# Patient Record
Sex: Female | Born: 1985 | Race: White | Hispanic: No | Marital: Married | Smoking: Current every day smoker
Health system: Southern US, Community
[De-identification: ages and names within clinical notes are randomized; demographics above are authoritative.]

## PROBLEM LIST (undated history)

## (undated) ENCOUNTER — Inpatient Hospital Stay (HOSPITAL_COMMUNITY): Payer: Self-pay

## (undated) DIAGNOSIS — Z87442 Personal history of urinary calculi: Secondary | ICD-10-CM

## (undated) DIAGNOSIS — N200 Calculus of kidney: Secondary | ICD-10-CM

## (undated) DIAGNOSIS — K219 Gastro-esophageal reflux disease without esophagitis: Secondary | ICD-10-CM

## (undated) DIAGNOSIS — I10 Essential (primary) hypertension: Secondary | ICD-10-CM

## (undated) DIAGNOSIS — Z87440 Personal history of urinary (tract) infections: Secondary | ICD-10-CM

## (undated) HISTORY — PX: NO PAST SURGERIES: SHX2092

## (undated) HISTORY — DX: Essential (primary) hypertension: I10

---

## 2005-03-11 ENCOUNTER — Emergency Department (HOSPITAL_COMMUNITY): Admission: EM | Admit: 2005-03-11 | Discharge: 2005-03-12 | Payer: Self-pay | Admitting: Emergency Medicine

## 2008-03-04 ENCOUNTER — Emergency Department (HOSPITAL_COMMUNITY): Admission: EM | Admit: 2008-03-04 | Discharge: 2008-03-04 | Payer: Self-pay | Admitting: Emergency Medicine

## 2008-11-01 ENCOUNTER — Inpatient Hospital Stay (HOSPITAL_COMMUNITY): Admission: AD | Admit: 2008-11-01 | Discharge: 2008-11-02 | Payer: Self-pay | Admitting: Obstetrics & Gynecology

## 2010-08-01 LAB — URINALYSIS, ROUTINE W REFLEX MICROSCOPIC
Bilirubin Urine: NEGATIVE
Glucose, UA: NEGATIVE mg/dL
Hgb urine dipstick: NEGATIVE
Ketones, ur: 40 mg/dL — AB
Nitrite: NEGATIVE
Protein, ur: NEGATIVE mg/dL
Specific Gravity, Urine: 1.01 (ref 1.005–1.030)
Urobilinogen, UA: 1 mg/dL (ref 0.0–1.0)
pH: 6.5 (ref 5.0–8.0)

## 2010-08-01 LAB — URINE MICROSCOPIC-ADD ON

## 2010-08-01 LAB — CBC
HCT: 32.8 % — ABNORMAL LOW (ref 36.0–46.0)
Hemoglobin: 11.6 g/dL — ABNORMAL LOW (ref 12.0–15.0)
MCHC: 35.3 g/dL (ref 30.0–36.0)
MCV: 94.4 fL (ref 78.0–100.0)
Platelets: 205 10*3/uL (ref 150–400)
RBC: 3.48 MIL/uL — ABNORMAL LOW (ref 3.87–5.11)
RDW: 13.3 % (ref 11.5–15.5)
WBC: 12.9 10*3/uL — ABNORMAL HIGH (ref 4.0–10.5)

## 2010-08-01 LAB — WET PREP, GENITAL
Clue Cells Wet Prep HPF POC: NONE SEEN
Trich, Wet Prep: NONE SEEN
Yeast Wet Prep HPF POC: NONE SEEN

## 2010-08-01 LAB — GC/CHLAMYDIA PROBE AMP, GENITAL
Chlamydia, DNA Probe: NEGATIVE
GC Probe Amp, Genital: NEGATIVE

## 2012-09-18 ENCOUNTER — Encounter (HOSPITAL_COMMUNITY): Payer: Self-pay

## 2012-09-18 ENCOUNTER — Emergency Department (HOSPITAL_COMMUNITY)
Admission: EM | Admit: 2012-09-18 | Discharge: 2012-09-18 | Disposition: A | Discharge status: Home / Self Care | Payer: Managed Care, Other (non HMO) | Attending: Emergency Medicine | Admitting: Emergency Medicine

## 2012-09-18 DIAGNOSIS — S90569A Insect bite (nonvenomous), unspecified ankle, initial encounter: Secondary | ICD-10-CM | POA: Insufficient documentation

## 2012-09-18 DIAGNOSIS — F17200 Nicotine dependence, unspecified, uncomplicated: Secondary | ICD-10-CM | POA: Insufficient documentation

## 2012-09-18 DIAGNOSIS — Y939 Activity, unspecified: Secondary | ICD-10-CM | POA: Insufficient documentation

## 2012-09-18 DIAGNOSIS — W57XXXA Bitten or stung by nonvenomous insect and other nonvenomous arthropods, initial encounter: Secondary | ICD-10-CM

## 2012-09-18 DIAGNOSIS — Y929 Unspecified place or not applicable: Secondary | ICD-10-CM | POA: Insufficient documentation

## 2012-09-18 MED ORDER — DIPHENHYDRAMINE HCL 25 MG PO CAPS
50.0000 mg | ORAL_CAPSULE | Freq: Once | ORAL | Status: AC
  Administered 2012-09-18: 50 mg via ORAL
  Filled 2012-09-18: qty 2

## 2012-09-18 MED ORDER — SULFAMETHOXAZOLE-TRIMETHOPRIM 800-160 MG PO TABS: 1.0000 | ORAL_TABLET | Freq: Two times a day (BID) | ORAL | Status: DC

## 2012-09-18 NOTE — ED Provider Notes (Signed)
History     CSN: [id number]  Arrival date & time 09/18/12  0028   First MD Initiated Contact with Patient 09/18/12 0111      Chief Complaint  Patient presents with  . Insect Bite    (Consider location/radiation/quality/duration/timing/severity/associated sxs/prior treatment) HPI... bite right lower extremity approximately 24 hours ago.  Now area is inflamed.  No fever or chills. Palpation makes pain worse. Severity is mild. No radiation of pain. No other complaint  History reviewed. No pertinent past medical history.  History reviewed. No pertinent past surgical history.  No family history on file.  History  Substance Use Topics  . Smoking status: Current Every Day Smoker  . Smokeless tobacco: Not on file  . Alcohol Use: Yes    OB History   Grav Para Term Preterm Abortions TAB SAB Ect Mult Living                  Review of Systems  All other systems reviewed and are negative.    Allergies  Codeine  Home Medications   Current Outpatient Rx  Name  Route  Sig  Dispense  Refill  . sulfamethoxazole-trimethoprim (SEPTRA DS) 800-160 MG per tablet   Oral   Take 1 tablet by mouth every 12 (twelve) hours.   14 tablet   0     BP 153/88  Pulse 88  Temp(Src) 98.1 F (36.7 C) (Oral)  Resp 16  Ht 5\' 2"  (1.575 m)  Wt 145 lb (65.772 kg)  BMI 26.51 kg/m2  SpO2 100%  LMP 09/04/2012  Physical Exam  Constitutional: She is oriented to person, place, and time. She appears well-developed and well-nourished.  HENT:  Head: Normocephalic.  Musculoskeletal: Normal range of motion.  Neurological: She is alert and oriented to person, place, and time.  Skin:  Right lower extremity: Erythema approximately 4 cm in diameter on the proximal lateral aspect of the right tibia  Psychiatric: She has a normal mood and affect.    ED Course  Procedures (including critical care time)  Labs Reviewed - No data to display No results found.   1. Insect bite       MDM  Low  acuity bug bite.  wound is inflamed but not infected.   Patient instructed to start antibiotic if wound becomes infected        Donnetta Hutching, MD 09/18/12 0221

## 2012-09-18 NOTE — ED Notes (Signed)
Pt states she thinks she got bitten by something on Sunday, states area to right lower leg is painful and area has gotten bigger

## 2013-10-15 ENCOUNTER — Ambulatory Visit (INDEPENDENT_AMBULATORY_CARE_PROVIDER_SITE_OTHER): Payer: Managed Care, Other (non HMO) | Admitting: Adult Health

## 2013-10-15 ENCOUNTER — Encounter: Payer: Self-pay | Admitting: Adult Health

## 2013-10-15 VITALS — BP 150/108

## 2013-10-15 VITALS — BP 132/90 | HR 88 | Ht 62.0 in | Wt 150.0 lb

## 2013-10-15 DIAGNOSIS — Z331 Pregnant state, incidental: Secondary | ICD-10-CM

## 2013-10-15 DIAGNOSIS — Z1389 Encounter for screening for other disorder: Secondary | ICD-10-CM

## 2013-10-15 DIAGNOSIS — I10 Essential (primary) hypertension: Secondary | ICD-10-CM

## 2013-10-15 DIAGNOSIS — Z3201 Encounter for pregnancy test, result positive: Secondary | ICD-10-CM

## 2013-10-15 LAB — CBC
HCT: 42.8 % (ref 36.0–46.0)
Hemoglobin: 15 g/dL (ref 12.0–15.0)
MCH: 31.3 pg (ref 26.0–34.0)
MCHC: 35 g/dL (ref 30.0–36.0)
MCV: 89.2 fL (ref 78.0–100.0)
Platelets: 251 10*3/uL (ref 150–400)
RBC: 4.8 MIL/uL (ref 3.87–5.11)
RDW: 13.2 % (ref 11.5–15.5)
WBC: 8.3 10*3/uL (ref 4.0–10.5)

## 2013-10-15 LAB — POCT URINALYSIS DIPSTICK
Blood, UA: NEGATIVE
Glucose, UA: NEGATIVE
Ketones, UA: NEGATIVE
Leukocytes, UA: NEGATIVE
Nitrite, UA: NEGATIVE
Protein, UA: NEGATIVE

## 2013-10-15 LAB — POCT URINE PREGNANCY: Preg Test, Ur: POSITIVE

## 2013-10-15 MED ORDER — LABETALOL HCL 100 MG PO TABS: 100.0000 mg | ORAL_TABLET | Freq: Two times a day (BID) | ORAL | Status: DC

## 2013-10-15 NOTE — Patient Instructions (Signed)

## 2013-10-15 NOTE — Progress Notes (Signed)
Pt here for pregnancy test. Positive result. Has been diagnosed with HTN in the past. Was on BP med, but pt stopped med. BP in office today was 150/108. After pt sitting for a while, recheck was 150/96. Pt was scheduled for appt today in office. JSY

## 2013-10-15 NOTE — Progress Notes (Addendum)
Subjective:     Patient ID: Bianca [name], female   DOB: 02-08-1986, 28 y.o.   MRN: [id number]  HPI Bianca [name] is a 28 year old white female in for pregnancy test which was positive and found to have an elevated BP.150/108,150/96, then 140/110 and 132/90/She said she has hypertension but quit taking meds about a year ago.She G3P2.   Review of Systems See HPI Reviewed past medical,surgical, social and family history. Reviewed medications and allergies.     Objective:   Physical Exam BP 132/90  Pulse 88  Ht 5\' 2"  (1.575 m)  Wt 150 lb (68.04 kg)  BMI 27.43 kg/m2 Skin warm and dry.  Lungs: clear to ausculation bilaterally. Cardiovascular: regular rate and rhythm.   discussed that she needs to take BP meds that i will prescribe today, discussed with Dr Despina HiddenEure, will Rx Labetalol 100 mg 1 bid.  Assessment:    Hypertension Pregnant    Plan:     Rx labetalol 100 mg #60 1 bid with 6 refills Return in 1 week for US and new OB visit Check PN 1 labs today and CMP as baseline Review handout on first trimester

## 2013-10-16 ENCOUNTER — Other Ambulatory Visit: Payer: Self-pay | Admitting: Adult Health

## 2013-10-16 DIAGNOSIS — O10019 Pre-existing essential hypertension complicating pregnancy, unspecified trimester: Secondary | ICD-10-CM

## 2013-10-16 DIAGNOSIS — O3680X0 Pregnancy with inconclusive fetal viability, not applicable or unspecified: Secondary | ICD-10-CM

## 2013-10-16 LAB — ANTIBODY SCREEN: Antibody Screen: NEGATIVE

## 2013-10-16 LAB — COMPREHENSIVE METABOLIC PANEL
ALT: 22 U/L (ref 0–35)
AST: 14 U/L (ref 0–37)
Albumin: 4.5 g/dL (ref 3.5–5.2)
Alkaline Phosphatase: 93 U/L (ref 39–117)
BUN: 7 mg/dL (ref 6–23)
CO2: 27 mEq/L (ref 19–32)
Calcium: 9.4 mg/dL (ref 8.4–10.5)
Chloride: 105 mEq/L (ref 96–112)
Creat: 0.86 mg/dL (ref 0.50–1.10)
Glucose, Bld: 74 mg/dL (ref 70–99)
Potassium: 3.8 mEq/L (ref 3.5–5.3)
Sodium: 140 mEq/L (ref 135–145)
Total Bilirubin: 0.5 mg/dL (ref 0.2–1.2)
Total Protein: 6.8 g/dL (ref 6.0–8.3)

## 2013-10-16 LAB — RUBELLA SCREEN: Rubella: 2.5 Index — ABNORMAL HIGH (ref <0.90)

## 2013-10-16 LAB — HIV ANTIBODY (ROUTINE TESTING W REFLEX): HIV 1&2 Ab, 4th Generation: NONREACTIVE

## 2013-10-16 LAB — TSH: TSH: 1.687 u[IU]/mL (ref 0.350–4.500)

## 2013-10-16 LAB — HEPATITIS B SURFACE ANTIGEN: Hepatitis B Surface Ag: NEGATIVE

## 2013-10-16 LAB — RPR

## 2013-10-16 LAB — ABO AND RH: Rh Type: POSITIVE

## 2013-10-16 LAB — VARICELLA ZOSTER ANTIBODY, IGG: Varicella IgG: 463.8 Index — ABNORMAL HIGH (ref <135.00)

## 2013-10-18 LAB — CYSTIC FIBROSIS DIAGNOSTIC STUDY

## 2013-10-22 ENCOUNTER — Ambulatory Visit (INDEPENDENT_AMBULATORY_CARE_PROVIDER_SITE_OTHER): Payer: Managed Care, Other (non HMO)

## 2013-10-22 ENCOUNTER — Other Ambulatory Visit: Payer: Self-pay | Admitting: Adult Health

## 2013-10-22 ENCOUNTER — Encounter: Payer: Self-pay | Admitting: Adult Health

## 2013-10-22 ENCOUNTER — Other Ambulatory Visit (HOSPITAL_COMMUNITY)
Admission: RE | Admit: 2013-10-22 | Discharge: 2013-10-22 | Disposition: A | Discharge status: Home / Self Care | Payer: Managed Care, Other (non HMO) | Source: Ambulatory Visit | Attending: Obstetrics & Gynecology | Admitting: Obstetrics & Gynecology

## 2013-10-22 ENCOUNTER — Encounter: Payer: Self-pay | Admitting: Women's Health

## 2013-10-22 ENCOUNTER — Ambulatory Visit (INDEPENDENT_AMBULATORY_CARE_PROVIDER_SITE_OTHER): Payer: Managed Care, Other (non HMO) | Admitting: Women's Health

## 2013-10-22 VITALS — BP 128/80 | Wt 150.0 lb

## 2013-10-22 DIAGNOSIS — O10019 Pre-existing essential hypertension complicating pregnancy, unspecified trimester: Secondary | ICD-10-CM | POA: Insufficient documentation

## 2013-10-22 DIAGNOSIS — F17200 Nicotine dependence, unspecified, uncomplicated: Secondary | ICD-10-CM

## 2013-10-22 DIAGNOSIS — Z3481 Encounter for supervision of other normal pregnancy, first trimester: Secondary | ICD-10-CM

## 2013-10-22 DIAGNOSIS — O09899 Supervision of other high risk pregnancies, unspecified trimester: Secondary | ICD-10-CM

## 2013-10-22 DIAGNOSIS — Z331 Pregnant state, incidental: Secondary | ICD-10-CM

## 2013-10-22 DIAGNOSIS — Z1389 Encounter for screening for other disorder: Secondary | ICD-10-CM

## 2013-10-22 DIAGNOSIS — Z8759 Personal history of other complications of pregnancy, childbirth and the puerperium: Secondary | ICD-10-CM

## 2013-10-22 DIAGNOSIS — O0990 Supervision of high risk pregnancy, unspecified, unspecified trimester: Secondary | ICD-10-CM | POA: Insufficient documentation

## 2013-10-22 DIAGNOSIS — O3680X0 Pregnancy with inconclusive fetal viability, not applicable or unspecified: Secondary | ICD-10-CM

## 2013-10-22 DIAGNOSIS — Z113 Encounter for screening for infections with a predominantly sexual mode of transmission: Secondary | ICD-10-CM | POA: Insufficient documentation

## 2013-10-22 DIAGNOSIS — O99330 Smoking (tobacco) complicating pregnancy, unspecified trimester: Secondary | ICD-10-CM

## 2013-10-22 DIAGNOSIS — Z01419 Encounter for gynecological examination (general) (routine) without abnormal findings: Secondary | ICD-10-CM | POA: Insufficient documentation

## 2013-10-22 LAB — POCT URINALYSIS DIPSTICK
Glucose, UA: NEGATIVE
Ketones, UA: NEGATIVE
Leukocytes, UA: NEGATIVE
Nitrite, UA: NEGATIVE
PROTEIN UA: NEGATIVE

## 2013-10-22 MED ORDER — DOXYLAMINE-PYRIDOXINE 10-10 MG PO TBEC: DELAYED_RELEASE_TABLET | ORAL | Status: DC

## 2013-10-22 NOTE — Patient Instructions (Signed)
 81mg  Baby Aspirin beginning at 12 weeks  Pregnancy - First Trimester During sexual intercourse, millions of sperm go into the vagina. Only 1 sperm will penetrate and fertilize the female egg while it is in the Fallopian tube. One week later, the fertilized egg implants into the wall of the uterus. An embryo begins to develop into a baby. At 6 to 8 weeks, the eyes and face are formed and the heartbeat can be seen on ultrasound. At the end of 12 weeks (first trimester), all the baby's organs are formed. Now that you are pregnant, you will want to do everything you can to have a healthy baby. Two of the most important things are to get good prenatal care and follow your caregiver's instructions. Prenatal care is all the medical care you receive before the baby's birth. It is given to prevent, find, and treat problems during the pregnancy and childbirth. PRENATAL EXAMS  During prenatal visits, your weight, blood pressure, and urine are checked. This is done to make sure you are healthy and progressing normally during the pregnancy.  A pregnant woman should gain 25 to 35 pounds during the pregnancy. However, if you are overweight or underweight, your caregiver will advise you regarding your weight.  Your caregiver will ask and answer questions for you.  Blood work, cervical cultures, other necessary tests, and a Pap test are done during your prenatal exams. These tests are done to check on your health and the probable health of your baby. Tests are strongly recommended and done for HIV with your permission. This is the virus that causes AIDS. These tests are done because medicines can be given to help prevent your baby from being born with this infection should you have been infected without knowing it. Blood work is also used to find out your blood type, previous infections, and follow your blood levels (hemoglobin).  Low hemoglobin (anemia) is common during pregnancy. Iron and vitamins are given to help  prevent this. Later in the pregnancy, blood tests for diabetes will be done along with any other tests if any problems develop.  You may need other tests to make sure you and the baby are doing well. CHANGES DURING THE FIRST TRIMESTER  Your body goes through many changes during pregnancy. They vary from person to person. Talk to your caregiver about changes you notice and are concerned about. Changes can include:  Your menstrual period stops.  The egg and sperm carry the genes that determine what you look like. Genes from you and your partner are forming a baby. The female genes determine whether the baby is a boy or a girl.  Your body increases in girth and you may feel bloated.  Feeling sick to your stomach (nauseous) and throwing up (vomiting). If the vomiting is uncontrollable, call your caregiver.  Your breasts will begin to enlarge and become tender.  Your nipples may stick out more and become darker.  The need to urinate more. Painful urination may mean you have a bladder infection.  Tiring easily.  Loss of appetite.  Cravings for certain kinds of food.  At first, you may gain or lose a couple of pounds.  You may have changes in your emotions from day to day (excited to be pregnant or concerned something may go wrong with the pregnancy and baby).  You may have more vivid and strange dreams. HOME CARE INSTRUCTIONS   It is very important to avoid all smoking, alcohol and non-prescribed drugs during your pregnancy. These  affect the formation and growth of the baby. Avoid chemicals while pregnant to ensure the delivery of a healthy infant.  Start your prenatal visits by the 12th week of pregnancy. They are usually scheduled monthly at first, then more often in the last 2 months before delivery. Keep your caregiver's appointments. Follow your caregiver's instructions regarding medicine use, blood and lab tests, exercise, and diet.  During pregnancy, you are providing food for you  and your baby. Eat regular, well-balanced meals. Choose foods such as meat, fish, milk and other low fat dairy products, vegetables, fruits, and whole-grain breads and cereals. Your caregiver will tell you of the ideal weight gain.  You can help morning sickness by keeping soda crackers at the bedside. Eat a couple before arising in the morning. You may want to use the crackers without salt on them.  Eating 4 to 5 small meals rather than 3 large meals a day also may help the nausea and vomiting.  Drinking liquids between meals instead of during meals also seems to help nausea and vomiting.  A physical sexual relationship may be continued throughout pregnancy if there are no other problems. Problems may be early (premature) leaking of amniotic fluid from the membranes, vaginal bleeding, or belly (abdominal) pain.  Exercise regularly if there are no restrictions. Check with your caregiver or physical therapist if you are unsure of the safety of some of your exercises. Greater weight gain will occur in the last 2 trimesters of pregnancy. Exercising will help:  Control your weight.  Keep you in shape.  Prepare you for labor and delivery.  Help you lose your pregnancy weight after you deliver your baby.  Wear a good support or jogging bra for breast tenderness during pregnancy. This may help if worn during sleep too.  Ask when prenatal classes are available. Begin classes when they are offered.  Do not use hot tubs, steam rooms, or saunas.  Wear your seat belt when driving. This protects you and your baby if you are in an accident.  Avoid raw meat, uncooked cheese, cat litter boxes, and soil used by cats throughout the pregnancy. These carry germs that can cause birth defects in the baby.  The first trimester is a good time to visit your dentist for your dental health. Getting your teeth cleaned is okay. Use a softer toothbrush and brush gently during pregnancy.  Ask for help if you have  financial, counseling, or nutritional needs during pregnancy. Your caregiver will be able to offer counseling for these needs as well as refer you for other special needs.  Do not take any medicines or herbs unless told by your caregiver.  Inform your caregiver if there is any mental or physical domestic violence.  Make a list of emergency phone numbers of family, friends, hospital, and police and fire departments.  Write down your questions. Take them to your prenatal visit.  Do not douche.  Do not cross your legs.  If you have to stand for long periods of time, rotate you feet or take small steps in a circle.  You may have more vaginal secretions that may require a sanitary pad. Do not use tampons or scented sanitary pads. MEDICINES AND DRUG USE IN PREGNANCY  Take prenatal vitamins as directed. The vitamin should contain 1 milligram of folic acid. Keep all vitamins out of reach of children. Only a couple vitamins or tablets containing iron may be fatal to a baby or young child when ingested.  Avoid use  of all medicines, including herbs, over-the-counter medicines, not prescribed or suggested by your caregiver. Only take over-the-counter or prescription medicines for pain, discomfort, or fever as directed by your caregiver. Do not use aspirin, ibuprofen, or naproxen unless directed by your caregiver.  Let your caregiver also know about herbs you may be using.  Alcohol is related to a number of birth defects. This includes fetal alcohol syndrome. All alcohol, in any form, should be avoided completely. Smoking will cause low birth rate and premature babies.  Street or illegal drugs are very harmful to the baby. They are absolutely forbidden. A baby born to an addicted mother will be addicted at birth. The baby will go through the same withdrawal an adult does.  Let your caregiver know about any medicines that you have to take and for what reason you take them. SEEK MEDICAL CARE IF:  You  have any concerns or worries during your pregnancy. It is better to call with your questions if you feel they cannot wait, rather than worry about them. SEEK IMMEDIATE MEDICAL CARE IF:   An unexplained oral temperature above 102 F (38.9 C) develops, or as your caregiver suggests.  You have leaking of fluid from the vagina (birth canal). If leaking membranes are suspected, take your temperature and inform your caregiver of this when you call.  There is vaginal spotting or bleeding. Notify your caregiver of the amount and how many pads are used.  You develop a bad smelling vaginal discharge with a change in the color.  You continue to feel sick to your stomach (nauseated) and have no relief from remedies suggested. You vomit blood or coffee ground-like materials.  You lose more than 2 pounds of weight in 1 week.  You gain more than 2 pounds of weight in 1 week and you notice swelling of your face, hands, feet, or legs.  You gain 5 pounds or more in 1 week (even if you do not have swelling of your hands, face, legs, or feet).  You get exposed to [place] measles and have never had them.  You are exposed to fifth disease or chickenpox.  You develop belly (abdominal) pain. Round ligament discomfort is a common non-cancerous (benign) cause of abdominal pain in pregnancy. Your caregiver still must evaluate this.  You develop headache, fever, diarrhea, pain with urination, or shortness of breath.  You fall or are in a car accident or have any kind of trauma.  There is mental or physical violence in your home. Document Released: 04/05/2001 Document Revised: 01/04/2012 Document Reviewed: 02/19/2013 [facility] Patient Information 2015 [place], [place]. This information is not intended to replace advice given to you by your health care provider. Make sure you discuss any questions you have with your health care provider.

## 2013-10-22 NOTE — Progress Notes (Signed)
Subjective:  Bianca [name] is a 28 y.o. 173P2002 Caucasian female at 1382w3d by LMP being seen today for her first obstetrical visit.  Her obstetrical history is significant for smoker: 1.5ppd prior to pregnancy, now at <1ppd, GHTN x 2- IOL w/ both, then developed CHTN was on meds- but stopped- restarted last week by JAG.  Pregnancy history fully reviewed.  Patient reports nausea/vomiting- not daily- does request meds. Occ heart racing even before pregnancy- has improved some. Denies vb, cramping, uti s/s, abnormal/malodorous vag d/c, or vulvovaginal itching/irritation.  BP 128/80  Wt 150 lb (68.04 kg)  LMP 09/14/2013  HISTORY: OB History  Gravida Para Term Preterm AB SAB TAB Ectopic Multiple Living  3 2 2       2     # Outcome Date GA Lbr Len/2nd Weight Sex Delivery Anes PTL Lv  3 CUR           2 TRM 04/05/09 2439w0d  6 lb 14 oz (3.118 kg) F SVD   Y  1 TRM 03/07/06 6039w0d  7 lb 3 oz (3.26 kg) M SVD EPI  Y     Past Medical History  Diagnosis Date  . Hypertension    Past Surgical History  Procedure Laterality Date  . No past surgeries     Family History  Problem Relation Age of Onset  . Heart disease Paternal Grandmother   . Cancer Paternal Grandmother     breast  . Thyroid disease Father   . Cancer Father     lung  . Hodgkin's lymphoma Father   . Kidney disease Mother     GN3N  . Other Maternal Grandmother     Guillian Barre syndrome    Exam   System:     General: Well developed & nourished, no acute distress   Skin: Warm & dry, normal coloration and turgor, no rashes   Neurologic: Alert & oriented, normal mood   Cardiovascular: Regular rate & rhythm   Respiratory: Effort & rate normal, LCTAB, acyanotic   Abdomen: Soft, non tender   Extremities: normal strength, tone   Pelvic Exam:    Perineum: Normal perineum   Vulva: Normal, no lesions   Vagina:  Normal mucosa, normal discharge   Cervix: Normal, bulbous, appears closed   Uterus: Normal size/shape/contour for GA    Thin prep pap smear obtained w/ reflex high risk HPV FHR: n/a- GS only at today's u/s   Assessment:   Pregnancy: [id number] Patient Active Problem List   Diagnosis Date Noted  . Supervision of other high-risk pregnancy 10/22/2013    Priority: High  . Benign essential hypertension antepartum 10/22/2013    Priority: High  . Smoker 10/22/2013    Priority: High  . Hypertension 10/15/2013    6482w3d G3P2002 New OB visit CHTN H/O GHTN x 2 Smoker N/V of pregnancy    Plan:  Initial labs drawn Continue prenatal vitamins Problem list reviewed and updated Reviewed n/v relief measures and warning s/s to report Rx diclegis, prior auth sent, and 2 samples given.  Reviewed recommended weight gain based on pre-gravid BMI Encouraged well-balanced diet Genetic Screening discussed Integrated Screen: requested Cystic fibrosis screening discussed results reviewed Ultrasound discussed; fetal survey: requested Follow up in ~10d for f/u confirmatory u/s, then in 4wks weeks for hrob CCNC completed Baby ASA @ 12wks  Advised smoking cessation, discussed risks to fetus while pregnant, to infant pp, and to herself, offered QuitlineNC, accepted, referral sent.    Continue labetalol Get baseline 24hr urine total  protein  Marge DuncansBooker, Kimberly Randall CNM, [facility] LandWHNP-BC 10/22/2013 4:04 PM

## 2013-10-22 NOTE — Progress Notes (Signed)
U/S-transvaginal u/s performed, single intrauterine GS noted with YS noted, no fetal pole noted on today's exam, cx appears closed, bilateral adnexa appears WNL with C.L. Noted on Rt, no free fluid noted within the pelvis, GS meas c/w 5+wks, would like to reck for confirmation of viability and dates in ~10 days

## 2013-10-23 LAB — DRUG SCREEN, URINE, NO CONFIRMATION
AMPHETAMINE SCRN UR: NEGATIVE
Barbiturate Quant, Ur: NEGATIVE
Benzodiazepines.: NEGATIVE
Cocaine Metabolites: NEGATIVE
Creatinine,U: 78.2 mg/dL
Marijuana Metabolite: NEGATIVE
Methadone: NEGATIVE
Opiate Screen, Urine: NEGATIVE
Phencyclidine (PCP): NEGATIVE
Propoxyphene: NEGATIVE

## 2013-10-23 LAB — URINALYSIS
Bilirubin Urine: NEGATIVE
GLUCOSE, UA: NEGATIVE mg/dL
HGB URINE DIPSTICK: NEGATIVE
Ketones, ur: NEGATIVE mg/dL
Leukocytes, UA: NEGATIVE
Nitrite: NEGATIVE
Protein, ur: NEGATIVE mg/dL
Specific Gravity, Urine: 1.012 (ref 1.005–1.030)
Urobilinogen, UA: 0.2 mg/dL (ref 0.0–1.0)
pH: 6.5 (ref 5.0–8.0)

## 2013-10-23 LAB — OXYCODONE SCREEN, UA, RFLX CONFIRM: Oxycodone Screen, Ur: NEGATIVE ng/mL

## 2013-10-24 LAB — URINE CULTURE
Colony Count: NO GROWTH
Organism ID, Bacteria: NO GROWTH

## 2013-10-25 LAB — PROTEIN, URINE, 24 HOUR

## 2013-10-26 ENCOUNTER — Encounter: Payer: Self-pay | Admitting: Women's Health

## 2013-10-28 LAB — CYTOLOGY - PAP

## 2013-11-01 ENCOUNTER — Ambulatory Visit (INDEPENDENT_AMBULATORY_CARE_PROVIDER_SITE_OTHER): Payer: Managed Care, Other (non HMO)

## 2013-11-01 ENCOUNTER — Other Ambulatory Visit: Payer: Self-pay | Admitting: Women's Health

## 2013-11-01 DIAGNOSIS — O10019 Pre-existing essential hypertension complicating pregnancy, unspecified trimester: Secondary | ICD-10-CM

## 2013-11-01 DIAGNOSIS — O3680X1 Pregnancy with inconclusive fetal viability, fetus 1: Secondary | ICD-10-CM

## 2013-11-01 DIAGNOSIS — O3680X0 Pregnancy with inconclusive fetal viability, not applicable or unspecified: Secondary | ICD-10-CM

## 2013-11-01 DIAGNOSIS — O09899 Supervision of other high risk pregnancies, unspecified trimester: Secondary | ICD-10-CM

## 2013-11-01 NOTE — Progress Notes (Signed)
U/S(6+6wks)-single IUP with +FCA Noted, FHR- 129 bpm, cx appears closed, bilateral adnexa appears WNL, CRL c/w LMP dates

## 2013-11-05 ENCOUNTER — Encounter: Payer: Self-pay | Admitting: Women's Health

## 2013-11-06 ENCOUNTER — Telehealth: Payer: Self-pay | Admitting: *Deleted

## 2013-11-06 NOTE — Telephone Encounter (Signed)
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

## 2013-11-19 ENCOUNTER — Encounter: Payer: Self-pay | Admitting: Women's Health

## 2013-11-19 ENCOUNTER — Ambulatory Visit (INDEPENDENT_AMBULATORY_CARE_PROVIDER_SITE_OTHER): Payer: Managed Care, Other (non HMO) | Admitting: Women's Health

## 2013-11-19 VITALS — BP 124/76 | Wt 155.0 lb

## 2013-11-19 DIAGNOSIS — B373 Candidiasis of vulva and vagina: Secondary | ICD-10-CM

## 2013-11-19 DIAGNOSIS — R300 Dysuria: Secondary | ICD-10-CM

## 2013-11-19 DIAGNOSIS — O26891 Other specified pregnancy related conditions, first trimester: Secondary | ICD-10-CM

## 2013-11-19 DIAGNOSIS — Z1389 Encounter for screening for other disorder: Secondary | ICD-10-CM

## 2013-11-19 DIAGNOSIS — Z331 Pregnant state, incidental: Secondary | ICD-10-CM

## 2013-11-19 DIAGNOSIS — Z36 Encounter for antenatal screening of mother: Secondary | ICD-10-CM

## 2013-11-19 DIAGNOSIS — O2390 Unspecified genitourinary tract infection in pregnancy, unspecified trimester: Secondary | ICD-10-CM

## 2013-11-19 DIAGNOSIS — B3731 Acute candidiasis of vulva and vagina: Secondary | ICD-10-CM

## 2013-11-19 DIAGNOSIS — N898 Other specified noninflammatory disorders of vagina: Secondary | ICD-10-CM

## 2013-11-19 DIAGNOSIS — Z3480 Encounter for supervision of other normal pregnancy, unspecified trimester: Secondary | ICD-10-CM

## 2013-11-19 LAB — POCT WET PREP (WET MOUNT): CLUE CELLS WET PREP WHIFF POC: NEGATIVE

## 2013-11-19 LAB — POCT URINALYSIS DIPSTICK
Glucose, UA: NEGATIVE
Ketones, UA: NEGATIVE
Leukocytes, UA: NEGATIVE
Nitrite, UA: NEGATIVE
PROTEIN UA: NEGATIVE
RBC UA: NEGATIVE

## 2013-11-19 NOTE — Patient Instructions (Addendum)
Begin Baby ASA 81mg  daily at 12 weeks  Monilial Vaginitis Vaginitis in a soreness, swelling and redness (inflammation) of the vagina and vulva. Monilial vaginitis is not a sexually transmitted infection. CAUSES  Yeast vaginitis is caused by yeast (candida) that is normally found in your vagina. With a yeast infection, the candida has overgrown in number to a point that upsets the chemical balance. SYMPTOMS   White, thick vaginal discharge.  Swelling, itching, redness and irritation of the vagina and possibly the lips of the vagina (vulva).  Burning or painful urination.  Painful intercourse. DIAGNOSIS  Things that may contribute to monilial vaginitis are:  Postmenopausal and virginal states.  Pregnancy.  Infections.  Being tired, sick or stressed, especially if you had monilial vaginitis in the past.  Diabetes. Good control will help lower the chance.  Birth control pills.  Tight fitting garments.  Using bubble bath, feminine sprays, douches or deodorant tampons.  Taking certain medications that kill germs (antibiotics).  Sporadic recurrence can occur if you become ill. TREATMENT  Your caregiver will give you medication.  There are several kinds of anti monilial vaginal creams and suppositories specific for monilial vaginitis. For recurrent yeast infections, use a suppository or cream in the vagina 2 times a week, or as directed.  Anti-monilial or steroid cream for the itching or irritation of the vulva may also be used. Get your caregiver's permission.  Painting the vagina with methylene blue solution may help if the monilial cream does not work.  Eating yogurt may help prevent monilial vaginitis. HOME CARE INSTRUCTIONS   Finish all medication as prescribed.  Do not have sex until treatment is completed or after your caregiver tells you it is okay.  Take warm sitz baths.  Do not douche.  Do not use tampons, especially scented ones.  Wear cotton  underwear.  Avoid tight pants and panty hose.  Tell your sexual partner that you have a yeast infection. They should go to their caregiver if they have symptoms such as mild rash or itching.  Your sexual partner should be treated as well if your infection is difficult to eliminate.  Practice safer sex. Use condoms.  Some vaginal medications cause latex condoms to fail. Vaginal medications that harm condoms are:  Cleocin cream.  Butoconazole (Femstat).  Terconazole (Terazol) vaginal suppository.  Miconazole (Monistat) (may be purchased over the counter). SEEK MEDICAL CARE IF:   You have a temperature by mouth above 102 F (38.9 C).  The infection is getting worse after 2 days of treatment.  The infection is not getting better after 3 days of treatment.  You develop blisters in or around your vagina.  You develop vaginal bleeding, and it is not your menstrual period.  You have pain when you urinate.  You develop intestinal problems.  You have pain with sexual intercourse. Document Released: 01/19/2005 Document Revised: 07/04/2011 Document Reviewed: 10/03/2008 [facility] Patient Information 2015 [place], [place]. This information is not intended to replace advice given to you by your health care provider. Make sure you discuss any questions you have with your health care provider.

## 2013-11-19 NOTE — Addendum Note (Signed)
Addended by: Cheral MarkerBOOKER, Treyten Monestime R on: 11/19/2013 09:51 AM   Modules accepted: Orders, Level of Service

## 2013-11-19 NOTE — Progress Notes (Signed)
High Risk Pregnancy Diagnosis(es): CHTN G3P2002 1921w3d Estimated Date of Delivery: 06/21/14 BP 124/76  Wt 155 lb (70.308 kg)  LMP 09/14/2013  Urinalysis: Negative HPI:  Burning at beginning of urination and lots of white d/c x 1wk. No itching/irritation/odor.  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  BP, weight, and urine reviewed.  Reports thinking she feels some fm already. Denies cramping, lof, vb, uti s/s.   Fundal Height:  Not measured Fetal Heart rate:  163 Edema:  None  Spec exam: large amount thick clumpy nonodorous d/c adherent to walls w/ thinner white d/c as well Wet prep: few wbc's, mod yeast Discussed hesitancy to treat during 1st trimester, she reports the burning w/ urination is really bothering her, she is past 8wks/organogenesis, so will use monistat 7- sample given  Reviewed warning s/s to report All questions were answered Assessment: 1921w3d, CHTN, vulvovaginal candida Medication(s) Plans:  Continue labetalol, monistat 7 for yeast, begin baby asa @ 12wks Treatment Plan:  Monthly growth scans beginning at 20wks Follow up in 3wks for high-risk OB appt, 1st nt/it

## 2013-12-11 ENCOUNTER — Encounter: Payer: Self-pay | Admitting: Women's Health

## 2013-12-11 ENCOUNTER — Ambulatory Visit (INDEPENDENT_AMBULATORY_CARE_PROVIDER_SITE_OTHER): Payer: Managed Care, Other (non HMO)

## 2013-12-11 ENCOUNTER — Ambulatory Visit (INDEPENDENT_AMBULATORY_CARE_PROVIDER_SITE_OTHER): Payer: Self-pay | Admitting: Women's Health

## 2013-12-11 VITALS — BP 132/74 | Wt 156.0 lb

## 2013-12-11 DIAGNOSIS — Z331 Pregnant state, incidental: Secondary | ICD-10-CM

## 2013-12-11 DIAGNOSIS — Z36 Encounter for antenatal screening of mother: Secondary | ICD-10-CM

## 2013-12-11 DIAGNOSIS — O09899 Supervision of other high risk pregnancies, unspecified trimester: Secondary | ICD-10-CM

## 2013-12-11 DIAGNOSIS — O10019 Pre-existing essential hypertension complicating pregnancy, unspecified trimester: Secondary | ICD-10-CM

## 2013-12-11 DIAGNOSIS — Z1389 Encounter for screening for other disorder: Secondary | ICD-10-CM

## 2013-12-11 LAB — POCT URINALYSIS DIPSTICK
Glucose, UA: NEGATIVE
Ketones, UA: NEGATIVE
Leukocytes, UA: NEGATIVE
NITRITE UA: NEGATIVE
Protein, UA: NEGATIVE
RBC UA: NEGATIVE

## 2013-12-11 NOTE — Progress Notes (Signed)
U/S(12+4wks)-single active fetus, meas c/w dates, fluid wnl, anterior Gr 0 placenta, cx appears closed (3.6cm), bilateral adnexa appears WNL, CRL c/w dates, NB present, NT-1.4067mm

## 2013-12-11 NOTE — Patient Instructions (Signed)
First Trimester of Pregnancy The first trimester of pregnancy is from week 1 until the end of week 12 (months 1 through 3). A week after a sperm fertilizes an egg, the egg will implant on the wall of the uterus. This embryo will begin to develop into a baby. Genes from you and your partner are forming the baby. The female genes determine whether the baby is a boy or a girl. At 6-8 weeks, the eyes and face are formed, and the heartbeat can be seen on ultrasound. At the end of 12 weeks, all the baby's organs are formed.  Now that you are pregnant, you will want to do everything you can to have a healthy baby. Two of the most important things are to get good prenatal care and to follow your health care provider's instructions. Prenatal care is all the medical care you receive before the baby's birth. This care will help prevent, find, and treat any problems during the pregnancy and childbirth. BODY CHANGES Your body goes through many changes during pregnancy. The changes vary from woman to woman.   You may gain or lose a couple of pounds at first.  You may feel sick to your stomach (nauseous) and throw up (vomit). If the vomiting is uncontrollable, call your health care provider.  You may tire easily.  You may develop headaches that can be relieved by medicines approved by your health care provider.  You may urinate more often. Painful urination may mean you have a bladder infection.  You may develop heartburn as a result of your pregnancy.  You may develop constipation because certain hormones are causing the muscles that push waste through your intestines to slow down.  You may develop hemorrhoids or swollen, bulging veins (varicose veins).  Your breasts may begin to grow larger and become tender. Your nipples may stick out more, and the tissue that surrounds them (areola) may become darker.  Your gums may bleed and may be sensitive to brushing and flossing.  Dark spots or blotches (chloasma,  mask of pregnancy) may develop on your face. This will likely fade after the baby is born.  Your menstrual periods will stop.  You may have a loss of appetite.  You may develop cravings for certain kinds of food.  You may have changes in your emotions from day to day, such as being excited to be pregnant or being concerned that something may go wrong with the pregnancy and baby.  You may have more vivid and strange dreams.  You may have changes in your hair. These can include thickening of your hair, rapid growth, and changes in texture. Some women also have hair loss during or after pregnancy, or hair that feels dry or thin. Your hair will most likely return to normal after your baby is born. WHAT TO EXPECT AT YOUR PRENATAL VISITS During a routine prenatal visit:  You will be weighed to make sure you and the baby are growing normally.  Your blood pressure will be taken.  Your abdomen will be measured to track your baby's growth.  The fetal heartbeat will be listened to starting around week 10 or 12 of your pregnancy.  Test results from any previous visits will be discussed. Your health care provider may ask you:  How you are feeling.  If you are feeling the baby move.  If you have had any abnormal symptoms, such as leaking fluid, bleeding, severe headaches, or abdominal cramping.  If you have any questions. Other tests   that may be performed during your first trimester include:  Blood tests to find your blood type and to check for the presence of any previous infections. They will also be used to check for low iron levels (anemia) and Rh antibodies. Later in the pregnancy, blood tests for diabetes will be done along with other tests if problems develop.  Urine tests to check for infections, diabetes, or protein in the urine.  An ultrasound to confirm the proper growth and development of the baby.  An amniocentesis to check for possible genetic problems.  Fetal screens for  spina bifida and Down syndrome.  You may need other tests to make sure you and the baby are doing well. HOME CARE INSTRUCTIONS  Medicines  Follow your health care provider's instructions regarding medicine use. Specific medicines may be either safe or unsafe to take during pregnancy.  Take your prenatal vitamins as directed.  If you develop constipation, try taking a stool softener if your health care provider approves. Diet  Eat regular, well-balanced meals. Choose a variety of foods, such as meat or vegetable-based protein, fish, milk and low-fat dairy products, vegetables, fruits, and whole grain breads and cereals. Your health care provider will help you determine the amount of weight gain that is right for you.  Avoid raw meat and uncooked cheese. These carry germs that can cause birth defects in the baby.  Eating four or five small meals rather than three large meals a day may help relieve nausea and vomiting. If you start to feel nauseous, eating a few soda crackers can be helpful. Drinking liquids between meals instead of during meals also seems to help nausea and vomiting.  If you develop constipation, eat more high-fiber foods, such as fresh vegetables or fruit and whole grains. Drink enough fluids to keep your urine clear or pale yellow. Activity and Exercise  Exercise only as directed by your health care provider. Exercising will help you:  Control your weight.  Stay in shape.  Be prepared for labor and delivery.  Experiencing pain or cramping in the lower abdomen or low back is a good sign that you should stop exercising. Check with your health care provider before continuing normal exercises.  Try to avoid standing for long periods of time. Move your legs often if you must stand in one place for a long time.  Avoid heavy lifting.  Wear low-heeled shoes, and practice good posture.  You may continue to have sex unless your health care provider directs you  otherwise. Relief of Pain or Discomfort  Wear a good support bra for breast tenderness.   Take warm sitz baths to soothe any pain or discomfort caused by hemorrhoids. Use hemorrhoid cream if your health care provider approves.   Rest with your legs elevated if you have leg cramps or low back pain.  If you develop varicose veins in your legs, wear support hose. Elevate your feet for 15 minutes, 3-4 times a day. Limit salt in your diet. Prenatal Care  Schedule your prenatal visits by the twelfth week of pregnancy. They are usually scheduled monthly at first, then more often in the last 2 months before delivery.  Write down your questions. Take them to your prenatal visits.  Keep all your prenatal visits as directed by your health care provider. Safety  Wear your seat belt at all times when driving.  Make a list of emergency phone numbers, including numbers for family, friends, the hospital, and police and fire departments. General Tips    Ask your health care provider for a referral to a local prenatal education class. Begin classes no later than at the beginning of month 6 of your pregnancy.  Ask for help if you have counseling or nutritional needs during pregnancy. Your health care provider can offer advice or refer you to specialists for help with various needs.  Do not use hot tubs, steam rooms, or saunas.  Do not douche or use tampons or scented sanitary pads.  Do not cross your legs for long periods of time.  Avoid cat litter boxes and soil used by cats. These carry germs that can cause birth defects in the baby and possibly loss of the fetus by miscarriage or stillbirth.  Avoid all smoking, herbs, alcohol, and medicines not prescribed by your health care provider. Chemicals in these affect the formation and growth of the baby.  Schedule a dentist appointment. At home, brush your teeth with a soft toothbrush and be gentle when you floss. SEEK MEDICAL CARE IF:   You have  dizziness.  You have mild pelvic cramps, pelvic pressure, or nagging pain in the abdominal area.  You have persistent nausea, vomiting, or diarrhea.  You have a bad smelling vaginal discharge.  You have pain with urination.  You notice increased swelling in your face, hands, legs, or ankles. SEEK IMMEDIATE MEDICAL CARE IF:   You have a fever.  You are leaking fluid from your vagina.  You have spotting or bleeding from your vagina.  You have severe abdominal cramping or pain.  You have rapid weight gain or loss.  You vomit blood or material that looks like coffee grounds.  You are exposed to German measles and have never had them.  You are exposed to fifth disease or chickenpox.  You develop a severe headache.  You have shortness of breath.  You have any kind of trauma, such as from a fall or a car accident. Document Released: 04/05/2001 Document Revised: 08/26/2013 Document Reviewed: 02/19/2013 [employer] Patient Information 2015 [employer]. This information is not intended to replace advice given to you by your health care provider. Make sure you discuss any questions you have with your health care provider.  

## 2013-12-11 NOTE — Progress Notes (Signed)
Low-risk OB appointment [id number] Estimated Date of Delivery: 06/21/14 BP 132/74  Wt 156 lb (70.761 kg)  LMP 09/14/2013  BP, weight, and urine reviewed.  Refer to obstetrical flow sheet for FH & FHR.  No fm yet. Denies cramping, lof, vb, or uti s/s. No complaints. Reviewed today's normal nt u/s, warning s/s to report. Plan:  Continue routine obstetrical care  F/U in 4wks for OB appointment  1st it/nt

## 2013-12-18 LAB — MATERNAL SCREEN, INTEGRATED #1

## 2013-12-27 ENCOUNTER — Telehealth: Payer: Self-pay | Admitting: Women's Health

## 2013-12-27 NOTE — Telephone Encounter (Signed)
Spoke with pt. Pt is [redacted] weeks pregnant. Having a lot of pressure and describes a "shocking sensation" inside stomach. No bleeding. I spoke with Dr. Emelda Fear. He advised to take it easy the next 24 hours and then resume normal activity after 24 hours. I advised we do have an after hours nurse line available if anything changes over the weekend. Pt voiced understanding. JSY

## 2013-12-31 ENCOUNTER — Telehealth: Payer: Self-pay | Admitting: Women's Health

## 2013-12-31 NOTE — Telephone Encounter (Signed)
Pt states has cold symptoms, chest is hurting and HR was 105 yesterday, nasal and head congestion.  She has been taking Tylenol with no relief, advised pt to push fluids, rest and try taking sudafed and saline nasal spray.  If no improvement or symptoms worsen to call us back, pt verbalized understanding.

## 2014-01-09 ENCOUNTER — Encounter: Payer: Self-pay | Admitting: Obstetrics & Gynecology

## 2014-01-09 ENCOUNTER — Ambulatory Visit (INDEPENDENT_AMBULATORY_CARE_PROVIDER_SITE_OTHER): Payer: Managed Care, Other (non HMO) | Admitting: Obstetrics & Gynecology

## 2014-01-09 VITALS — BP 140/80 | Wt 155.0 lb

## 2014-01-09 DIAGNOSIS — Z331 Pregnant state, incidental: Secondary | ICD-10-CM

## 2014-01-09 DIAGNOSIS — O10019 Pre-existing essential hypertension complicating pregnancy, unspecified trimester: Secondary | ICD-10-CM

## 2014-01-09 DIAGNOSIS — Z1389 Encounter for screening for other disorder: Secondary | ICD-10-CM

## 2014-01-09 DIAGNOSIS — Z36 Encounter for antenatal screening of mother: Secondary | ICD-10-CM

## 2014-01-09 DIAGNOSIS — O09899 Supervision of other high risk pregnancies, unspecified trimester: Secondary | ICD-10-CM

## 2014-01-09 LAB — POCT URINALYSIS DIPSTICK
Blood, UA: NEGATIVE
Glucose, UA: NEGATIVE
Ketones, UA: NEGATIVE
Leukocytes, UA: NEGATIVE
Nitrite, UA: NEGATIVE
PROTEIN UA: NEGATIVE

## 2014-01-09 NOTE — Progress Notes (Signed)
High Risk Pregnancy Diagnosis(es):   Chronic hypertension  G3P2002 [redacted]w[redacted]d Estimated Date of Delivery: 06/21/14  Blood pressure 140/80, weight 155 lb (70.308 kg), last menstrual period 09/14/2013.  Urinalysis: Negative   HPI: No complaints     BP weight and urine results all reviewed and noted. Patient reports good fetal movement, denies any bleeding and no rupture of membranes symptoms or regular contractions.  Fundal Height:  16 Fetal Heart rate:  153 Edema:  trace  Patient is without complaints. All questions were answered.  Assessment:  [redacted]w[redacted]d,   Chronic hypertesnion  Medication(s) Plans:  Continue at current levels  Treatment Plan:  As above  Follow up in 3 weeks for OB appt, sonogram

## 2014-01-09 NOTE — Addendum Note (Signed)
Addended by: Richardson Chiquito on: 01/09/2014 03:18 PM   Modules accepted: Orders

## 2014-01-15 LAB — MATERNAL SCREEN, INTEGRATED #2
AFP MOM MAT SCREEN: 0.93
AFP, Serum: 33.3 ng/mL
Calculated Gestational Age: 16.9
Crown Rump Length: 66.2 mm
Estriol Mom: 0.85
Estriol, Free: 0.86 ng/mL
INHIBIN A DIMERIC MAT SCREEN: 465 pg/mL
INHIBIN A MOM MAT SCREEN: 2.85
MSS Down Syndrome: 1:900 {titer}
NT MoM: 1.12
NUCHAL TRANSLUCENCY MAT SCREEN 2: 1.67 mm
NUMBER OF FETUSES MAT SCREEN 2: 1
PAPP-A MAT SCREEN: 955 ng/mL
PAPP-A MOM MAT SCREEN: 1.43
Rish for ONTD: <1:5000 {titer}
hCG MoM: 2.06
hCG, Serum: 59.5 IU/mL

## 2014-01-27 ENCOUNTER — Other Ambulatory Visit: Payer: Self-pay | Admitting: Obstetrics & Gynecology

## 2014-01-27 ENCOUNTER — Encounter: Payer: Self-pay | Admitting: Women's Health

## 2014-01-27 ENCOUNTER — Ambulatory Visit (INDEPENDENT_AMBULATORY_CARE_PROVIDER_SITE_OTHER): Payer: Managed Care, Other (non HMO)

## 2014-01-27 ENCOUNTER — Ambulatory Visit (INDEPENDENT_AMBULATORY_CARE_PROVIDER_SITE_OTHER): Payer: Managed Care, Other (non HMO) | Admitting: Women's Health

## 2014-01-27 VITALS — BP 120/70 | Wt 157.0 lb

## 2014-01-27 DIAGNOSIS — O1012 Pre-existing hypertensive heart disease complicating childbirth: Secondary | ICD-10-CM

## 2014-01-27 DIAGNOSIS — O10019 Pre-existing essential hypertension complicating pregnancy, unspecified trimester: Secondary | ICD-10-CM

## 2014-01-27 DIAGNOSIS — Z363 Encounter for antenatal screening for malformations: Secondary | ICD-10-CM

## 2014-01-27 DIAGNOSIS — O0990 Supervision of high risk pregnancy, unspecified, unspecified trimester: Secondary | ICD-10-CM

## 2014-01-27 DIAGNOSIS — Z1389 Encounter for screening for other disorder: Secondary | ICD-10-CM

## 2014-01-27 DIAGNOSIS — Z331 Pregnant state, incidental: Secondary | ICD-10-CM

## 2014-01-27 DIAGNOSIS — Z36 Encounter for antenatal screening of mother: Secondary | ICD-10-CM

## 2014-01-27 LAB — POCT URINALYSIS DIPSTICK
Blood, UA: NEGATIVE
Glucose, UA: NEGATIVE
KETONES UA: NEGATIVE
Leukocytes, UA: NEGATIVE
NITRITE UA: NEGATIVE
Protein, UA: NEGATIVE

## 2014-01-27 NOTE — Patient Instructions (Signed)
Second Trimester of Pregnancy The second trimester is from week 13 through week 28, months 4 through 6. The second trimester is often a time when you feel your best. Your body has also adjusted to being pregnant, and you begin to feel better physically. Usually, morning sickness has lessened or quit completely, you may have more energy, and you may have an increase in appetite. The second trimester is also a time when the fetus is growing rapidly. At the end of the sixth month, the fetus is about 9 inches long and weighs about 1 pounds. You will likely begin to feel the baby move (quickening) between 18 and 20 weeks of the pregnancy. BODY CHANGES Your body goes through many changes during pregnancy. The changes vary from woman to woman.   Your weight will continue to increase. You will notice your lower abdomen bulging out.  You may begin to get stretch marks on your hips, abdomen, and breasts.  You may develop headaches that can be relieved by medicines approved by your health care provider.  You may urinate more often because the fetus is pressing on your bladder.  You may develop or continue to have heartburn as a result of your pregnancy.  You may develop constipation because certain hormones are causing the muscles that push waste through your intestines to slow down.  You may develop hemorrhoids or swollen, bulging veins (varicose veins).  You may have back pain because of the weight gain and pregnancy hormones relaxing your joints between the bones in your pelvis and as a result of a shift in weight and the muscles that support your balance.  Your breasts will continue to grow and be tender.  Your gums may bleed and may be sensitive to brushing and flossing.  Dark spots or blotches (chloasma, mask of pregnancy) may develop on your face. This will likely fade after the baby is born.  A dark line from your belly button to the pubic area (linea nigra) may appear. This will likely fade  after the baby is born.  You may have changes in your hair. These can include thickening of your hair, rapid growth, and changes in texture. Some women also have hair loss during or after pregnancy, or hair that feels dry or thin. Your hair will most likely return to normal after your baby is born. WHAT TO EXPECT AT YOUR PRENATAL VISITS During a routine prenatal visit:  You will be weighed to make sure you and the fetus are growing normally.  Your blood pressure will be taken.  Your abdomen will be measured to track your baby's growth.  The fetal heartbeat will be listened to.  Any test results from the previous visit will be discussed. Your health care provider may ask you:  How you are feeling.  If you are feeling the baby move.  If you have had any abnormal symptoms, such as leaking fluid, bleeding, severe headaches, or abdominal cramping.  If you have any questions. Other tests that may be performed during your second trimester include:  Blood tests that check for:  Low iron levels (anemia).  Gestational diabetes (between 24 and 28 weeks).  Rh antibodies.  Urine tests to check for infections, diabetes, or protein in the urine.  An ultrasound to confirm the proper growth and development of the baby.  An amniocentesis to check for possible genetic problems.  Fetal screens for spina bifida and Down syndrome. HOME CARE INSTRUCTIONS   Avoid all smoking, herbs, alcohol, and unprescribed   drugs. These chemicals affect the formation and growth of the baby.  Follow your health care provider's instructions regarding medicine use. There are medicines that are either safe or unsafe to take during pregnancy.  Exercise only as directed by your health care provider. Experiencing uterine cramps is a good sign to stop exercising.  Continue to eat regular, healthy meals.  Wear a good support bra for breast tenderness.  Do not use hot tubs, steam rooms, or saunas.  Wear your  seat belt at all times when driving.  Avoid raw meat, uncooked cheese, cat litter boxes, and soil used by cats. These carry germs that can cause birth defects in the baby.  Take your prenatal vitamins.  Try taking a stool softener (if your health care provider approves) if you develop constipation. Eat more high-fiber foods, such as fresh vegetables or fruit and whole grains. Drink plenty of fluids to keep your urine clear or pale yellow.  Take warm sitz baths to soothe any pain or discomfort caused by hemorrhoids. Use hemorrhoid cream if your health care provider approves.  If you develop varicose veins, wear support hose. Elevate your feet for 15 minutes, 3-4 times a day. Limit salt in your diet.  Avoid heavy lifting, wear low heel shoes, and practice good posture.  Rest with your legs elevated if you have leg cramps or low back pain.  Visit your dentist if you have not gone yet during your pregnancy. Use a soft toothbrush to brush your teeth and be gentle when you floss.  A sexual relationship may be continued unless your health care provider directs you otherwise.  Continue to go to all your prenatal visits as directed by your health care provider. SEEK MEDICAL CARE IF:   You have dizziness.  You have mild pelvic cramps, pelvic pressure, or nagging pain in the abdominal area.  You have persistent nausea, vomiting, or diarrhea.  You have a bad smelling vaginal discharge.  You have pain with urination. SEEK IMMEDIATE MEDICAL CARE IF:   You have a fever.  You are leaking fluid from your vagina.  You have spotting or bleeding from your vagina.  You have severe abdominal cramping or pain.  You have rapid weight gain or loss.  You have shortness of breath with chest pain.  You notice sudden or extreme swelling of your face, hands, ankles, feet, or legs.  You have not felt your baby move in over an hour.  You have severe headaches that do not go away with  medicine.  You have vision changes. Document Released: 04/05/2001 Document Revised: 04/16/2013 Document Reviewed: 06/12/2012 [employer] Patient Information 2015 [employer]. This information is not intended to replace advice given to you by your health care provider. Make sure you discuss any questions you have with your health care provider.  

## 2014-01-27 NOTE — Progress Notes (Signed)
U/S(19+2wks)-active fetus, meas c/w dates, fluid wnl, anterior Gr 0 placenta, cx appears closed, bilateral adnexa appears WNL, FHR-148 bpm, female fetus, no major abnl noted

## 2014-01-27 NOTE — Progress Notes (Signed)
High Risk Pregnancy Diagnosis(es): CHTN G3P2002 493w2d Estimated Date of Delivery: 06/21/14 BP 120/70  Wt 157 lb (71.215 kg)  LMP 09/14/2013  Urinalysis: Negative HPI:  Some cramping, no abnormal d/c, itching/irritation.  BP, weight, and urine reviewed.  Reports good fm. Denies regular uc's, lof, vb, uti s/s.   Fundal Height:  19wks Fetal Heart rate:  148 u/s Edema:  None Cx long and closed on u/s, will defer exam  Reviewed today's normal anatomy u/s, ptl s/s, fm All questions were answered Assessment: 133w2d CHTN Medication(s) Plans:  Continue labetalol and baby asa Treatment Plan:  Growth u/s at 28wks, antenatal testing @ 32wks Follow up in 4wks for high-risk OB appt, wants to get flu shot then

## 2014-02-24 ENCOUNTER — Encounter: Payer: Self-pay | Admitting: Women's Health

## 2014-02-24 ENCOUNTER — Ambulatory Visit (INDEPENDENT_AMBULATORY_CARE_PROVIDER_SITE_OTHER): Payer: Managed Care, Other (non HMO) | Admitting: *Deleted

## 2014-02-24 ENCOUNTER — Ambulatory Visit (INDEPENDENT_AMBULATORY_CARE_PROVIDER_SITE_OTHER): Payer: Managed Care, Other (non HMO) | Admitting: Women's Health

## 2014-02-24 VITALS — BP 138/78 | Wt 158.0 lb

## 2014-02-24 DIAGNOSIS — R102 Pelvic and perineal pain: Secondary | ICD-10-CM

## 2014-02-24 DIAGNOSIS — O26892 Other specified pregnancy related conditions, second trimester: Secondary | ICD-10-CM

## 2014-02-24 DIAGNOSIS — Z8759 Personal history of other complications of pregnancy, childbirth and the puerperium: Secondary | ICD-10-CM

## 2014-02-24 DIAGNOSIS — O0990 Supervision of high risk pregnancy, unspecified, unspecified trimester: Secondary | ICD-10-CM

## 2014-02-24 DIAGNOSIS — Z23 Encounter for immunization: Secondary | ICD-10-CM

## 2014-02-24 DIAGNOSIS — Z1389 Encounter for screening for other disorder: Secondary | ICD-10-CM

## 2014-02-24 DIAGNOSIS — Z6791 Unspecified blood type, Rh negative: Secondary | ICD-10-CM

## 2014-02-24 DIAGNOSIS — Z331 Pregnant state, incidental: Secondary | ICD-10-CM

## 2014-02-24 DIAGNOSIS — N949 Unspecified condition associated with female genital organs and menstrual cycle: Secondary | ICD-10-CM

## 2014-02-24 DIAGNOSIS — O360921 Maternal care for other rhesus isoimmunization, second trimester, fetus 1: Secondary | ICD-10-CM

## 2014-02-24 LAB — POCT URINALYSIS DIPSTICK
Blood, UA: NEGATIVE
GLUCOSE UA: NEGATIVE
Ketones, UA: NEGATIVE
LEUKOCYTES UA: NEGATIVE
Nitrite, UA: NEGATIVE
Protein, UA: NEGATIVE

## 2014-02-24 LAB — POCT WET PREP (WET MOUNT): Clue Cells Wet Prep Whiff POC: NEGATIVE

## 2014-02-24 MED ORDER — RHO D IMMUNE GLOBULIN 1500 UNIT/2ML IJ SOSY
300.0000 ug | PREFILLED_SYRINGE | Freq: Once | INTRAMUSCULAR | Status: AC
  Administered 2014-02-24: 300 ug via INTRAMUSCULAR

## 2014-02-24 NOTE — Patient Instructions (Signed)
You will have your sugar test next visit.  Please do not eat or drink anything after midnight the night before you come, not even water.  You will be here for at least two hours.     Call the office ([phone]) or go to [facility] if:  You begin to have strong, frequent contractions  Your water breaks.  Sometimes it is a big gush of fluid, sometimes it is just a trickle that keeps getting your panties wet or running down your legs  You have vaginal bleeding.  It is normal to have a small amount of spotting if your cervix was checked.   You don't feel your baby moving like normal.  If you don't, get you something to eat and drink and lay down and focus on feeling your baby move.  You should feel at least 10 movements in 2 hours.  If you don't, you should call the office or go to [facility].    Preterm Labor Information Preterm labor is when labor starts at less than 37 weeks of pregnancy. The normal length of a pregnancy is 39 to 41 weeks. CAUSES Often, there is no identifiable underlying cause as to why a woman goes into preterm labor. One of the most common known causes of preterm labor is infection. Infections of the uterus, cervix, vagina, amniotic sac, bladder, kidney, or even the lungs (pneumonia) can cause labor to start. Other suspected causes of preterm labor include:   Urogenital infections, such as yeast infections and bacterial vaginosis.   Uterine abnormalities (uterine shape, uterine septum, fibroids, or bleeding from the placenta).   A cervix that has been operated on (it may fail to stay closed).   Malformations in the fetus.   Multiple gestations (twins, triplets, and so on).   Breakage of the amniotic sac.  RISK FACTORS  Having a previous history of preterm labor.   Having premature rupture of membranes (PROM).   Having a placenta that covers the opening of the cervix (placenta previa).   Having a placenta that separates from the uterus  (placental abruption).   Having a cervix that is too weak to hold the fetus in the uterus (incompetent cervix).   Having too much fluid in the amniotic sac (polyhydramnios).   Taking illegal drugs or smoking while pregnant.   Not gaining enough weight while pregnant.   Being younger than 18 and older than 28 years old.   Having a low socioeconomic status.   Being African American. SYMPTOMS Signs and symptoms of preterm labor include:   Menstrual-like cramps, abdominal pain, or back pain.  Uterine contractions that are regular, as frequent as six in an hour, regardless of their intensity (may be mild or painful).  Contractions that start on the top of the uterus and spread down to the lower abdomen and back.   A sense of increased pelvic pressure.   A watery or bloody mucus discharge that comes from the vagina.  TREATMENT Depending on the length of the pregnancy and other circumstances, your health care provider may suggest bed rest. If necessary, there are medicines that can be given to stop contractions and to mature the fetal lungs. If labor happens before 34 weeks of pregnancy, a prolonged hospital stay may be recommended. Treatment depends on the condition of both you and the fetus.  WHAT SHOULD YOU DO IF YOU THINK YOU ARE IN PRETERM LABOR? Call your health care provider right away. You will need to go to the hospital   to get checked immediately. HOW CAN YOU PREVENT PRETERM LABOR IN FUTURE PREGNANCIES? You should:   Stop smoking if you smoke.  Maintain healthy weight gain and avoid chemicals and drugs that are not necessary.  Be watchful for any type of infection.  Inform your health care provider if you have a known history of preterm labor. Document Released: 07/02/2003 Document Revised: 12/12/2012 Document Reviewed: 05/14/2012 [facility] Patient Information 2015 [place], [place]. This information is not intended to replace advice given to you by your health  care provider. Make sure you discuss any questions you have with your health care provider.   Second Trimester of Pregnancy The second trimester is from week 13 through week 28, months 4 through 6. The second trimester is often a time when you feel your best. Your body has also adjusted to being pregnant, and you begin to feel better physically. Usually, morning sickness has lessened or quit completely, you may have more energy, and you may have an increase in appetite. The second trimester is also a time when the fetus is growing rapidly. At the end of the sixth month, the fetus is about 9 inches long and weighs about 1 pounds. You will likely begin to feel the baby move (quickening) between 18 and 20 weeks of the pregnancy. BODY CHANGES Your body goes through many changes during pregnancy. The changes vary from woman to woman.   Your weight will continue to increase. You will notice your lower abdomen bulging out.  You may begin to get stretch marks on your hips, abdomen, and breasts.  You may develop headaches that can be relieved by medicines approved by your health care provider.  You may urinate more often because the fetus is pressing on your bladder.  You may develop or continue to have heartburn as a result of your pregnancy.  You may develop constipation because certain hormones are causing the muscles that push waste through your intestines to slow down.  You may develop hemorrhoids or swollen, bulging veins (varicose veins).  You may have back pain because of the weight gain and pregnancy hormones relaxing your joints between the bones in your pelvis and as a result of a shift in weight and the muscles that support your balance.  Your breasts will continue to grow and be tender.  Your gums may bleed and may be sensitive to brushing and flossing.  Dark spots or blotches (chloasma, mask of pregnancy) may develop on your face. This will likely fade after the baby is born.  A  dark line from your belly button to the pubic area (linea nigra) may appear. This will likely fade after the baby is born.  You may have changes in your hair. These can include thickening of your hair, rapid growth, and changes in texture. Some women also have hair loss during or after pregnancy, or hair that feels dry or thin. Your hair will most likely return to normal after your baby is born. WHAT TO EXPECT AT YOUR PRENATAL VISITS During a routine prenatal visit:  You will be weighed to make sure you and the fetus are growing normally.  Your blood pressure will be taken.  Your abdomen will be measured to track your baby's growth.  The fetal heartbeat will be listened to.  Any test results from the previous visit will be discussed. Your health care provider may ask you:  How you are feeling.  If you are feeling the baby move.  If you have had any abnormal  symptoms, such as leaking fluid, bleeding, severe headaches, or abdominal cramping.  If you have any questions. Other tests that may be performed during your second trimester include:  Blood tests that check for:  Low iron levels (anemia).  Gestational diabetes (between 24 and 28 weeks).  Rh antibodies.  Urine tests to check for infections, diabetes, or protein in the urine.  An ultrasound to confirm the proper growth and development of the baby.  An amniocentesis to check for possible genetic problems.  Fetal screens for spina bifida and Down syndrome. HOME CARE INSTRUCTIONS   Avoid all smoking, herbs, alcohol, and unprescribed drugs. These chemicals affect the formation and growth of the baby.  Follow your health care provider's instructions regarding medicine use. There are medicines that are either safe or unsafe to take during pregnancy.  Exercise only as directed by your health care provider. Experiencing uterine cramps is a good sign to stop exercising.  Continue to eat regular, healthy meals.  Wear a good  support bra for breast tenderness.  Do not use hot tubs, steam rooms, or saunas.  Wear your seat belt at all times when driving.  Avoid raw meat, uncooked cheese, cat litter boxes, and soil used by cats. These carry germs that can cause birth defects in the baby.  Take your prenatal vitamins.  Try taking a stool softener (if your health care provider approves) if you develop constipation. Eat more high-fiber foods, such as fresh vegetables or fruit and whole grains. Drink plenty of fluids to keep your urine clear or pale yellow.  Take warm sitz baths to soothe any pain or discomfort caused by hemorrhoids. Use hemorrhoid cream if your health care provider approves.  If you develop varicose veins, wear support hose. Elevate your feet for 15 minutes, 3-4 times a day. Limit salt in your diet.  Avoid heavy lifting, wear low heel shoes, and practice good posture.  Rest with your legs elevated if you have leg cramps or low back pain.  Visit your dentist if you have not gone yet during your pregnancy. Use a soft toothbrush to brush your teeth and be gentle when you floss.  A sexual relationship may be continued unless your health care provider directs you otherwise.  Continue to go to all your prenatal visits as directed by your health care provider. SEEK MEDICAL CARE IF:   You have dizziness.  You have mild pelvic cramps, pelvic pressure, or nagging pain in the abdominal area.  You have persistent nausea, vomiting, or diarrhea.  You have a bad smelling vaginal discharge.  You have pain with urination. SEEK IMMEDIATE MEDICAL CARE IF:   You have a fever.  You are leaking fluid from your vagina.  You have spotting or bleeding from your vagina.  You have severe abdominal cramping or pain.  You have rapid weight gain or loss.  You have shortness of breath with chest pain.  You notice sudden or extreme swelling of your face, hands, ankles, feet, or legs.  You have not felt your  baby move in over an hour.  You have severe headaches that do not go away with medicine.  You have vision changes. Document Released: 04/05/2001 Document Revised: 04/16/2013 Document Reviewed: 06/12/2012 [facility] Patient Information 2015 [place], [place]. This information is not intended to replace advice given to you by your health care provider. Make sure you discuss any questions you have with your health care provider.

## 2014-02-24 NOTE — Progress Notes (Signed)
High Risk Pregnancy Diagnosis(es): CHTN [id number] 8056w2d Estimated Date of Delivery: 06/21/14 BP 138/78 mmHg  Wt 158 lb (71.668 kg)  LMP 09/14/2013  Urinalysis: Negative HPI:  Lots of pressure/pain x 24month, [facility]- thought it was kidney infection, was told she was having uc's- cx not checked.  BP, weight, and urine reviewed.  Reports good fm. Denies regular uc's, lof, vb, uti s/s.  Fundal Height:  22 Fetal Heart rate:  140 Edema:  None Spec exam: cx visually closed, small amount white nonodorous d/c, fFN collected- not sent d/t SVE SVE: cl/th/high Wet prep neg  Reviewed ptl s/s, fkc All questions were answered Assessment: 456w2d CHTN Medication(s) Plans:  Continue labetalol Treatment Plan:  Growth/afi at next visit Follow up in 4wks for high-risk OB appt, pn2, and growth/afi u/s Flu shot today

## 2014-03-15 ENCOUNTER — Inpatient Hospital Stay (HOSPITAL_COMMUNITY)
Admission: AD | Admit: 2014-03-15 | Discharge: 2014-03-17 | DRG: 781 | Disposition: A | Discharge status: Home / Self Care | Payer: Managed Care, Other (non HMO) | Source: Ambulatory Visit | Attending: Obstetrics and Gynecology | Admitting: Obstetrics and Gynecology

## 2014-03-15 ENCOUNTER — Inpatient Hospital Stay (HOSPITAL_COMMUNITY): Payer: Managed Care, Other (non HMO)

## 2014-03-15 ENCOUNTER — Encounter (HOSPITAL_COMMUNITY): Payer: Self-pay | Admitting: *Deleted

## 2014-03-15 DIAGNOSIS — O0990 Supervision of high risk pregnancy, unspecified, unspecified trimester: Secondary | ICD-10-CM

## 2014-03-15 DIAGNOSIS — Z8759 Personal history of other complications of pregnancy, childbirth and the puerperium: Secondary | ICD-10-CM

## 2014-03-15 DIAGNOSIS — Z3A26 26 weeks gestation of pregnancy: Secondary | ICD-10-CM | POA: Diagnosis present

## 2014-03-15 DIAGNOSIS — O26892 Other specified pregnancy related conditions, second trimester: Secondary | ICD-10-CM

## 2014-03-15 DIAGNOSIS — N200 Calculus of kidney: Secondary | ICD-10-CM

## 2014-03-15 DIAGNOSIS — R109 Unspecified abdominal pain: Secondary | ICD-10-CM

## 2014-03-15 DIAGNOSIS — N1330 Unspecified hydronephrosis: Secondary | ICD-10-CM | POA: Diagnosis present

## 2014-03-15 DIAGNOSIS — N132 Hydronephrosis with renal and ureteral calculous obstruction: Secondary | ICD-10-CM | POA: Diagnosis present

## 2014-03-15 DIAGNOSIS — O2342 Unspecified infection of urinary tract in pregnancy, second trimester: Secondary | ICD-10-CM | POA: Diagnosis not present

## 2014-03-15 DIAGNOSIS — O99332 Smoking (tobacco) complicating pregnancy, second trimester: Secondary | ICD-10-CM | POA: Diagnosis present

## 2014-03-15 DIAGNOSIS — Z3482 Encounter for supervision of other normal pregnancy, second trimester: Secondary | ICD-10-CM | POA: Diagnosis present

## 2014-03-15 HISTORY — DX: Personal history of urinary (tract) infections: Z87.440

## 2014-03-15 LAB — URINALYSIS, ROUTINE W REFLEX MICROSCOPIC
Bilirubin Urine: NEGATIVE
GLUCOSE, UA: NEGATIVE mg/dL
KETONES UR: NEGATIVE mg/dL
NITRITE: NEGATIVE
Protein, ur: NEGATIVE mg/dL
Specific Gravity, Urine: 1.025 (ref 1.005–1.030)
UROBILINOGEN UA: 1 mg/dL (ref 0.0–1.0)
pH: 7 (ref 5.0–8.0)

## 2014-03-15 LAB — URINE MICROSCOPIC-ADD ON

## 2014-03-15 LAB — CBC
HEMATOCRIT: 33.2 % — AB (ref 36.0–46.0)
Hemoglobin: 11.8 g/dL — ABNORMAL LOW (ref 12.0–15.0)
MCH: 33.3 pg (ref 26.0–34.0)
MCHC: 35.5 g/dL (ref 30.0–36.0)
MCV: 93.8 fL (ref 78.0–100.0)
PLATELETS: 191 10*3/uL (ref 150–400)
RBC: 3.54 MIL/uL — ABNORMAL LOW (ref 3.87–5.11)
RDW: 12.9 % (ref 11.5–15.5)
WBC: 15.7 10*3/uL — ABNORMAL HIGH (ref 4.0–10.5)

## 2014-03-15 MED ORDER — ZOLPIDEM TARTRATE 5 MG PO TABS: 5.0000 mg | ORAL_TABLET | Freq: Every evening | ORAL | Status: DC | PRN

## 2014-03-15 MED ORDER — LABETALOL HCL 100 MG PO TABS
100.0000 mg | ORAL_TABLET | Freq: Two times a day (BID) | ORAL | Status: DC
  Administered 2014-03-15 – 2014-03-17 (×4): 100 mg via ORAL
  Filled 2014-03-15 (×4): qty 1

## 2014-03-15 MED ORDER — PROMETHAZINE HCL 25 MG/ML IJ SOLN
12.5000 mg | Freq: Four times a day (QID) | INTRAMUSCULAR | Status: DC | PRN
  Administered 2014-03-15: 12.5 mg via INTRAVENOUS
  Filled 2014-03-15: qty 1

## 2014-03-15 MED ORDER — ACETAMINOPHEN 325 MG PO TABS: 650.0000 mg | ORAL_TABLET | ORAL | Status: DC | PRN

## 2014-03-15 MED ORDER — DOCUSATE SODIUM 100 MG PO CAPS
100.0000 mg | ORAL_CAPSULE | Freq: Every day | ORAL | Status: DC
  Administered 2014-03-17: 100 mg via ORAL
  Filled 2014-03-15: qty 1

## 2014-03-15 MED ORDER — LACTATED RINGERS IV SOLN
INTRAVENOUS | Status: DC
  Administered 2014-03-15 – 2014-03-16 (×4): via INTRAVENOUS

## 2014-03-15 MED ORDER — HYDROMORPHONE HCL 1 MG/ML IJ SOLN
1.0000 mg | INTRAMUSCULAR | Status: DC | PRN
  Administered 2014-03-15 (×4): 1 mg via INTRAVENOUS
  Filled 2014-03-15 (×4): qty 1

## 2014-03-15 MED ORDER — PROMETHAZINE HCL 25 MG/ML IJ SOLN
12.5000 mg | INTRAMUSCULAR | Status: DC | PRN
  Administered 2014-03-15 – 2014-03-16 (×4): 12.5 mg via INTRAVENOUS
  Filled 2014-03-15 (×4): qty 1

## 2014-03-15 MED ORDER — PRENATAL MULTIVITAMIN CH: 1.0000 | ORAL_TABLET | Freq: Every day | ORAL | Status: DC

## 2014-03-15 MED ORDER — CALCIUM CARBONATE ANTACID 500 MG PO CHEW: 2.0000 | CHEWABLE_TABLET | ORAL | Status: DC | PRN

## 2014-03-15 MED ORDER — HYDROMORPHONE HCL 1 MG/ML IJ SOLN
1.0000 mg | Freq: Once | INTRAMUSCULAR | Status: AC
  Administered 2014-03-15: 1 mg via INTRAMUSCULAR
  Filled 2014-03-15: qty 1

## 2014-03-15 MED ORDER — CYCLOBENZAPRINE HCL 10 MG PO TABS
10.0000 mg | ORAL_TABLET | Freq: Three times a day (TID) | ORAL | Status: DC | PRN
  Administered 2014-03-15 – 2014-03-16 (×2): 10 mg via ORAL
  Filled 2014-03-15 (×3): qty 1

## 2014-03-15 MED ORDER — ONDANSETRON 4 MG PO TBDP
4.0000 mg | ORAL_TABLET | Freq: Once | ORAL | Status: AC
  Administered 2014-03-15: 4 mg via ORAL
  Filled 2014-03-15: qty 1

## 2014-03-15 MED ORDER — HYDROMORPHONE HCL 1 MG/ML IJ SOLN
1.0000 mg | INTRAMUSCULAR | Status: DC | PRN
  Administered 2014-03-16 (×4): 2 mg via INTRAVENOUS
  Administered 2014-03-16 (×2): 1 mg via INTRAVENOUS
  Administered 2014-03-16 (×2): 2 mg via INTRAVENOUS
  Filled 2014-03-15: qty 1
  Filled 2014-03-15 (×4): qty 2
  Filled 2014-03-15: qty 1
  Filled 2014-03-15: qty 2
  Filled 2014-03-15 (×2): qty 1

## 2014-03-15 NOTE — MAU Note (Signed)
Pt presents to MAU with complaints of pain in her right lower back that radiates to her abdomen since early this morning. Denies any vaginal bleeding or LOF

## 2014-03-15 NOTE — MAU Note (Signed)
Per [facility], pt to go to room 157

## 2014-03-15 NOTE — MAU Provider Note (Signed)
History     CSN: [id number]  Arrival date and time: 03/15/14 1240   None     Chief Complaint  Patient presents with  . Back Pain  . Abdominal Pain   HPI This is a 28 y.o. female at 3851w0d who presents with c/o right flank pain radiating forward since this morning. Denies leaking or bleeding.  Has history of kidney infection.   RN Note:  Expand All Collapse All   Pt presents to MAU with complaints of pain in her right lower back that radiates to her abdomen since early this morning. Denies any vaginal bleeding or LOF        OB History    Gravida Para Term Preterm AB TAB SAB Ectopic Multiple Living   3 2 2       2       Past Medical History  Diagnosis Date  . Hypertension     Past Surgical History  Procedure Laterality Date  . No past surgeries      Family History  Problem Relation Age of Onset  . Heart disease Paternal Grandmother   . Cancer Paternal Grandmother     breast  . Thyroid disease Father   . Cancer Father     lung  . Hodgkin's lymphoma Father   . Kidney disease Mother     GN3N  . Other Maternal Grandmother     Guillian Barre syndrome    History  Substance Use Topics  . Smoking status: Current Every Day Smoker    Types: Cigarettes  . Smokeless tobacco: Never Used  . Alcohol Use: No    Allergies:  Allergies  Allergen Reactions  . Codeine Hives and Shortness Of Breath    Prescriptions prior to admission  Medication Sig Dispense Refill Last Dose  . aspirin EC 81 MG tablet Take 81 mg by mouth daily.   Taking  . Doxylamine-Pyridoxine (DICLEGIS) 10-10 MG TBEC 2 tabs q hs, if sx persist add 1 tab q am on day 3, if sx persist add 1 tab q afternoon on day 4 100 tablet 4 Taking  . labetalol (NORMODYNE) 100 MG tablet Take 1 tablet (100 mg total) by mouth 2 (two) times daily. 60 tablet 6 Taking  . Prenatal Multivit-Min-Fe-FA (PRENATAL VITAMINS PO) Take by mouth daily.   Taking    Review of Systems  Constitutional: Negative for fever, chills  and malaise/fatigue.  Gastrointestinal: Positive for nausea and abdominal pain. Negative for vomiting, diarrhea and constipation.  Genitourinary: Positive for flank pain. Negative for dysuria.   Physical Exam   Blood pressure 143/93, pulse 120, temperature 97.6 F (36.4 C), resp. rate 18, last menstrual period 09/14/2013.  Physical Exam  Constitutional: She is oriented to person, place, and time. She appears well-developed and well-nourished. She appears distressed (with pain).  HENT:  Head: Normocephalic.  Cardiovascular: Normal rate.   Respiratory: Effort normal.  GI: Soft. She exhibits no distension and no mass. There is tenderness. There is no rebound and no guarding.  Musculoskeletal: Normal range of motion.  Neurological: She is alert and oriented to person, place, and time.  Skin: Skin is warm and dry.  Psychiatric: She has a normal mood and affect.    MAU Course  Procedures  MDM Results for orders placed or performed during the hospital encounter of 03/15/14 (from the past 24 hour(s))  Urinalysis, Routine w reflex microscopic     Status: Abnormal   Collection Time: 03/15/14 12:45 PM  Result Value Ref Range  Color, Urine YELLOW YELLOW   APPearance CLEAR CLEAR   Specific Gravity, Urine 1.025 1.005 - 1.030   pH 7.0 5.0 - 8.0   Glucose, UA NEGATIVE NEGATIVE mg/dL   Hgb urine dipstick SMALL (A) NEGATIVE   Bilirubin Urine NEGATIVE NEGATIVE   Ketones, ur NEGATIVE NEGATIVE mg/dL   Protein, ur NEGATIVE NEGATIVE mg/dL   Urobilinogen, UA 1.0 0.0 - 1.0 mg/dL   Nitrite NEGATIVE NEGATIVE   Leukocytes, UA TRACE (A) NEGATIVE  Urine microscopic-add on     Status: Abnormal   Collection Time: 03/15/14 12:45 PM  Result Value Ref Range   Squamous Epithelial / LPF FEW (A) RARE   WBC, UA 0-2 <3 WBC/hpf   RBC / HPF 3-6 <3 RBC/hpf   Bacteria, UA FEW (A) RARE   Urine-Other MUCOUS PRESENT    [place] Renal  03/15/2014   CLINICAL DATA:  Right flank pain radiating to pelvis since 9 a.m.  Hematuria. Twenty-six weeks pregnant.  EXAM: RENAL/URINARY TRACT ULTRASOUND COMPLETE  COMPARISON:  None.  FINDINGS: Right Kidney:  Length: 11.4 cm. Moderate right hydronephrosis. Normal echotexture. No focal abnormality.  Left Kidney:  Length: 9.7 cm. Echogenicity within normal limits. No mass or hydronephrosis visualized.  Bladder:  Poorly visualized due to decompressed state and gravid uterus  IMPRESSION: Moderate right hydronephrosis.   Electronically Signed   By: Charlett NoseKevin  Dover M.D.   On: 03/15/2014 14:26     Assessment and Plan  A:  SIUP at 6120w0d       Moderate right hydronephrosis, presumed stone       P:  Discussed with Dr Emelda FearFerguson       Admit for observation       IV hydration and analgesics       Strain urine       Will add Flexeril for possible muscle spasm component  [facility]verWILLIAMS,Thekla Colborn 03/15/2014, 1:28 PM

## 2014-03-15 NOTE — H&P (Signed)
History     CSN: [id number]  Arrival date and time: 03/15/14 1240   None     Chief Complaint  Patient presents with  . Back Pain  . Abdominal Pain   HPI This is a 28 y.o. female at [redacted]w[redacted]d who presents with c/o right flank pain radiating forward since this morning. Denies leaking or bleeding.  Has history of kidney infection.   RN Note:  Expand All Collapse All   Pt presents to MAU with complaints of pain in her right lower back that radiates to her abdomen since early this morning. Denies any vaginal bleeding or LOF        OB History    Gravida Para Term Preterm AB TAB SAB Ectopic Multiple Living   3 2 2       2      Past Medical History  Diagnosis Date  . Hypertension     Past Surgical History  Procedure Laterality Date  . No past surgeries      Family History  Problem Relation Age of Onset  . Heart disease Paternal Grandmother   . Cancer Paternal Grandmother     breast  . Thyroid disease Father   . Cancer Father     lung  . Hodgkin's lymphoma Father   . Kidney disease Mother     GN3N  . Other Maternal Grandmother     Guillian Barre syndrome    History  Substance Use Topics  . Smoking status: Current Every Day Smoker    Types: Cigarettes  . Smokeless tobacco: Never Used  . Alcohol Use: No    Allergies:  Allergies  Allergen Reactions  . Codeine Hives and Shortness Of Breath    Prescriptions prior to admission  Medication Sig Dispense Refill Last Dose  . aspirin EC 81 MG tablet Take 81 mg by mouth daily.   Taking  . Doxylamine-Pyridoxine (DICLEGIS) 10-10 MG TBEC 2 tabs q hs, if sx persist add 1 tab q am on day 3, if sx persist add 1 tab q afternoon on day 4 100 tablet 4 Taking  . labetalol (NORMODYNE) 100 MG tablet Take 1 tablet (100 mg total) by mouth 2 (two) times daily. 60 tablet 6 Taking  . Prenatal Multivit-Min-Fe-FA (PRENATAL VITAMINS PO) Take by mouth daily.   Taking    Review of Systems  Constitutional: Negative for fever, chills  and malaise/fatigue.  Gastrointestinal: Positive for nausea and abdominal pain. Negative for vomiting, diarrhea and constipation.  Genitourinary: Positive for flank pain. Negative for dysuria.   Physical Exam   Blood pressure 143/93, pulse 120, temperature 97.6 F (36.4 C), resp. rate 18, last menstrual period 09/14/2013.  Physical Exam  Constitutional: She is oriented to person, place, and time. She appears well-developed and well-nourished. She appears distressed (with pain).  HENT:  Head: Normocephalic.  Cardiovascular: Normal rate.   Respiratory: Effort normal.  GI: Soft. She exhibits no distension and no mass. There is tenderness. There is no rebound and no guarding.  Musculoskeletal: Normal range of motion.  Neurological: She is alert and oriented to person, place, and time.  Skin: Skin is warm and dry.  Psychiatric: She has a normal mood and affect.    MAU Course  Procedures  MDM Results for orders placed or performed during the hospital encounter of 03/15/14 (from the past 24 hour(s))  Urinalysis, Routine w reflex microscopic     Status: Abnormal   Collection Time: 03/15/14 12:45 PM  Result Value Ref Range     Color, Urine YELLOW YELLOW   APPearance CLEAR CLEAR   Specific Gravity, Urine 1.025 1.005 - 1.030   pH 7.0 5.0 - 8.0   Glucose, UA NEGATIVE NEGATIVE mg/dL   Hgb urine dipstick SMALL (A) NEGATIVE   Bilirubin Urine NEGATIVE NEGATIVE   Ketones, ur NEGATIVE NEGATIVE mg/dL   Protein, ur NEGATIVE NEGATIVE mg/dL   Urobilinogen, UA 1.0 0.0 - 1.0 mg/dL   Nitrite NEGATIVE NEGATIVE   Leukocytes, UA TRACE (A) NEGATIVE  Urine microscopic-add on     Status: Abnormal   Collection Time: 03/15/14 12:45 PM  Result Value Ref Range   Squamous Epithelial / LPF FEW (A) RARE   WBC, UA 0-2 <3 WBC/hpf   RBC / HPF 3-6 <3 RBC/hpf   Bacteria, UA FEW (A) RARE   Urine-Other MUCOUS PRESENT    [place] Renal  03/15/2014   CLINICAL DATA:  Right flank pain radiating to pelvis since 9 a.m.  Hematuria. Twenty-six weeks pregnant.  EXAM: RENAL/URINARY TRACT ULTRASOUND COMPLETE  COMPARISON:  None.  FINDINGS: Right Kidney:  Length: 11.4 cm. Moderate right hydronephrosis. Normal echotexture. No focal abnormality.  Left Kidney:  Length: 9.7 cm. Echogenicity within normal limits. No mass or hydronephrosis visualized.  Bladder:  Poorly visualized due to decompressed state and gravid uterus  IMPRESSION: Moderate right hydronephrosis.   Electronically Signed   By: Kevin  Dover M.D.   On: 03/15/2014 14:26     Assessment and Plan  A:  SIUP at [redacted]w[redacted]d       Moderate right hydronephrosis, presumed stone       P:  Discussed with Dr Ferguson       Admit for observation       IV hydration and analgesics       Strain urine       Will add Flexeril for possible muscle spasm component  Rory Montel 03/15/2014, 1:28 PM  

## 2014-03-16 ENCOUNTER — Inpatient Hospital Stay (HOSPITAL_COMMUNITY): Payer: Managed Care, Other (non HMO)

## 2014-03-16 DIAGNOSIS — Z3A26 26 weeks gestation of pregnancy: Secondary | ICD-10-CM

## 2014-03-16 DIAGNOSIS — N132 Hydronephrosis with renal and ureteral calculous obstruction: Secondary | ICD-10-CM

## 2014-03-16 DIAGNOSIS — N1330 Unspecified hydronephrosis: Secondary | ICD-10-CM | POA: Diagnosis present

## 2014-03-16 DIAGNOSIS — O26892 Other specified pregnancy related conditions, second trimester: Secondary | ICD-10-CM

## 2014-03-16 DIAGNOSIS — O2342 Unspecified infection of urinary tract in pregnancy, second trimester: Secondary | ICD-10-CM

## 2014-03-16 MED ORDER — CEFTRIAXONE SODIUM IN DEXTROSE 40 MG/ML IV SOLN
2.0000 g | Freq: Two times a day (BID) | INTRAVENOUS | Status: DC
  Administered 2014-03-16 (×2): 2 g via INTRAVENOUS
  Filled 2014-03-16 (×3): qty 50

## 2014-03-16 MED ORDER — SODIUM CHLORIDE 0.9 % IV SOLN
INTRAVENOUS | Status: DC
  Administered 2014-03-16 – 2014-03-17 (×4): via INTRAVENOUS
  Filled 2014-03-16: qty 1000

## 2014-03-16 NOTE — Care Management Note (Signed)
Lab called and a urine specimen is down there and they will run for culture as ordered per Dr. Emelda FearFerguson.

## 2014-03-16 NOTE — Plan of Care (Signed)
Problem: Consults Goal: Antepartum Patient Education Outcome: Completed/Met Date Met:  03/16/14 Goal: Orientation to unit: [place] (smoking, visitation, chaplain services, helpline)  Outcome: Completed/Met Date Met:  03/16/14  Problem: Phase I Progression Outcomes Goal: Initial discharge plan identified Outcome: Completed/Met Date Met:  03/16/14  Problem: Phase II Progression Outcomes Goal: Mattress in use Mattress in use ( Eggcrate, Med/Surg, Regular, Birthing Suite, Other)  Outcome: Completed/Met Date Met:  03/16/14 Goal: Output > 30 ml/hr or voiding qs Outcome: Completed/Met Date Met:  03/16/14

## 2014-03-16 NOTE — Plan of Care (Signed)
Problem: Consults Goal: Antepartum Patient Education Outcome: Progressing Pt. In such pain when admitted, need for reinforcement of education. Goal: Orientation to unit: Hospital policies & services ORIENTATION TO UNIT: HOSPITAL POLICIES & SERVICES (smoking, visitation, chaplain services, helpline)  Outcome: Progressing  Problem: Phase I Progression Outcomes Goal: Contractions < 5-6/hour Outcome: Progressing Pt. Without c/o contractions, no contractions see when monitoring either. Goal: Pain controlled with appropriate interventions Outcome: Progressing Dr. Emelda FearFerguson over to see pt. This evening and adj. Medication as needed.  Problem: Phase II Progression Outcomes Goal: Electronic fetal monitoring(Doppler,Continuous,Intermittent) EFM (Doppler, Continuous, Intermittent)  Outcome: Progressing Monitoring done for 30 minutes QS. Goal: Tolerating diet Outcome: Progressing Pt. Has no appetite with c/o pain. Goal: Mattress in use Mattress in use Nurse, adult( Eggcrate, Med/Surg, Regular, Birthing Suite, Other)  Outcome: Progressing Reg. Mattress in use. Goal: Medications as ordered (see description) Medications as ordered (Magnesium Sulfate, Steroids, PO Tocolysis, Antibiotics, Terbutaline Pump, Other)  Outcome: Progressing Medications adj. As needed for relief of pain.

## 2014-03-16 NOTE — Progress Notes (Signed)
Patient ID: Bianca [name], female   DOB: Aug 23, 1985, 28 y.o.   MRN: [id number] FACULTY PRACTICE ANTEPARTUM(COMPREHENSIVE) NOTE  Randell Marden NobleKemp is a 28 y.o. G3P2002 at 10013w1d by best clinical estimate who is admitted for right sided flank and lumbar pain associated with right hydronephrosis. Marland Kitchen.  Hx pyelo x 2  In past Fetal presentation is unsure. Length of Stay:  1  Days  Subjective: Pt is significantly uncomfortable with a steady pain near posterior superior iliac crest. Patient reports the fetal movement as active. Patient reports uterine contraction  activity as none. Patient reports  vaginal bleeding as none. Patient describes fluid per vagina as None.  Vitals:  Blood pressure 115/71, pulse 96, temperature 98 F (36.7 C), temperature source Oral, resp. rate 18, height 5\' 2"  (1.575 m), weight 70.308 kg (155 lb), last menstrual period 09/14/2013, SpO2 97 %. Physical Examination:  General appearance - alert, well appearing, and in no distress and ill-appearing Heart - normal rate and regular rhythm Abdomen - soft, nontender, nondistended Fundal Height:  size equals dates Cervical Exam: Not evaluated.   Extremities: extremities normal, atraumatic, no cyanosis or edema and Homans sign is negative, no sign of DVT with DTRs 2+ bilaterally Membranes:intact  Fetal Monitoring:  q shift, no contractions noted yesterday,  Labs:  Results for orders placed or performed during the hospital encounter of 03/15/14 (from the past 24 hour(s))  Urinalysis, Routine w reflex microscopic   Collection Time: 03/15/14 12:45 PM  Result Value Ref Range   Color, Urine YELLOW YELLOW   APPearance CLEAR CLEAR   Specific Gravity, Urine 1.025 1.005 - 1.030   pH 7.0 5.0 - 8.0   Glucose, UA NEGATIVE NEGATIVE mg/dL   Hgb urine dipstick SMALL (A) NEGATIVE   Bilirubin Urine NEGATIVE NEGATIVE   Ketones, ur NEGATIVE NEGATIVE mg/dL   Protein, ur NEGATIVE NEGATIVE mg/dL   Urobilinogen, UA 1.0 0.0 - 1.0 mg/dL   Nitrite NEGATIVE  NEGATIVE   Leukocytes, UA TRACE (A) NEGATIVE  Urine microscopic-add on   Collection Time: 03/15/14 12:45 PM  Result Value Ref Range   Squamous Epithelial / LPF FEW (A) RARE   WBC, UA 0-2 <3 WBC/hpf   RBC / HPF 3-6 <3 RBC/hpf   Bacteria, UA FEW (A) RARE   Urine-Other MUCOUS PRESENT   CBC on admission   Collection Time: 03/15/14  3:05 PM  Result Value Ref Range   WBC 15.7 (H) 4.0 - 10.5 K/uL   RBC 3.54 (L) 3.87 - 5.11 MIL/uL   Hemoglobin 11.8 (L) 12.0 - 15.0 g/dL   HCT 40.933.2 (L) 81.136.0 - 91.446.0 %   MCV 93.8 78.0 - 100.0 fL   MCH 33.3 26.0 - 34.0 pg   MCHC 35.5 30.0 - 36.0 g/dL   RDW 78.212.9 95.611.5 - 21.315.5 %   Platelets 191 150 - 400 K/uL    Imaging Studies:     Currently EPIC will not allow sonographic studies to automatically populate into notes.  In the meantime, copy and paste results into note or free text.  Medications:  Scheduled . docusate sodium  100 mg Oral Daily  . labetalol  100 mg Oral BID  . prenatal multivitamin  1 tablet Oral Q1200   I have reviewed the patient's current medications.  ASSESSMENT: Patient Active Problem List   Diagnosis Date Noted  . Hydronephrosis determined by ultrasound 03/16/2014  . Hydronephrosis of right kidney 03/15/2014  . High risk pregnancy, antepartum 10/22/2013  . Benign essential hypertension antepartum 10/22/2013  . Smoker  10/22/2013  . History of gestational hypertension 10/22/2013    PLAN: 1. Urine culture from admit urine ordered 2. Cover with antibiotics til C & S returns, rocephin 2 g bid 3 Pelvic u/s to look for ureteral jets, renal stone.  Bennett Ram V 03/16/2014,6:21 AM

## 2014-03-16 NOTE — Plan of Care (Signed)
US called need pts bladder full for test. Pt notified of this. US will call back when ready for pt or pt will go sooner if she becomes uncomfortable holding her bladder.

## 2014-03-17 ENCOUNTER — Other Ambulatory Visit: Payer: Self-pay | Admitting: Obstetrics and Gynecology

## 2014-03-17 DIAGNOSIS — N200 Calculus of kidney: Secondary | ICD-10-CM

## 2014-03-17 DIAGNOSIS — O10912 Unspecified pre-existing hypertension complicating pregnancy, second trimester: Secondary | ICD-10-CM

## 2014-03-17 LAB — URINE CULTURE
Colony Count: 25000
Special Requests: NORMAL

## 2014-03-17 LAB — CBC
HCT: 29.8 % — ABNORMAL LOW (ref 36.0–46.0)
Hemoglobin: 10.1 g/dL — ABNORMAL LOW (ref 12.0–15.0)
MCH: 32.9 pg (ref 26.0–34.0)
MCHC: 33.9 g/dL (ref 30.0–36.0)
MCV: 97.1 fL (ref 78.0–100.0)
PLATELETS: 187 10*3/uL (ref 150–400)
RBC: 3.07 MIL/uL — ABNORMAL LOW (ref 3.87–5.11)
RDW: 13.6 % (ref 11.5–15.5)
WBC: 12 10*3/uL — AB (ref 4.0–10.5)

## 2014-03-17 MED ORDER — OXYCODONE-ACETAMINOPHEN 5-325 MG PO TABS: 1.0000 | ORAL_TABLET | Freq: Four times a day (QID) | ORAL | Status: DC | PRN

## 2014-03-17 MED ORDER — CYCLOBENZAPRINE HCL 10 MG PO TABS: 10.0000 mg | ORAL_TABLET | Freq: Three times a day (TID) | ORAL | Status: DC | PRN

## 2014-03-17 MED ORDER — CEPHALEXIN 500 MG PO CAPS: 500.0000 mg | ORAL_CAPSULE | Freq: Four times a day (QID) | ORAL | Status: DC

## 2014-03-17 NOTE — Progress Notes (Signed)
Pt discharged home in stable condition.  Accompanied off in W/C unit by NT & family.  Discharge instructions and prescription reviewed with patient understanding verbalized.

## 2014-03-17 NOTE — Discharge Summary (Signed)
Antenatal Physician Discharge Summary  Patient ID: Bianca [name] MRN: [id number] DOB/AGE: Nov 16, 1985 28 y.o.  Admit date: 03/15/2014 Discharge date: 03/17/2014  Admission Diagnoses: back pain in pregnancy  Discharge Diagnoses:  Nephrolithiasis and UTI  Prenatal Procedures: NST  Intrapartum Procedures: Fluid hydration  Significant Diagnostic Studies:  Results for orders placed or performed during the hospital encounter of 03/15/14 (from the past 168 hour(s))  Urinalysis, Routine w reflex microscopic   Collection Time: 03/15/14 12:45 PM  Result Value Ref Range   Color, Urine YELLOW YELLOW   APPearance CLEAR CLEAR   Specific Gravity, Urine 1.025 1.005 - 1.030   pH 7.0 5.0 - 8.0   Glucose, UA NEGATIVE NEGATIVE mg/dL   Hgb urine dipstick SMALL (A) NEGATIVE   Bilirubin Urine NEGATIVE NEGATIVE   Ketones, ur NEGATIVE NEGATIVE mg/dL   Protein, ur NEGATIVE NEGATIVE mg/dL   Urobilinogen, UA 1.0 0.0 - 1.0 mg/dL   Nitrite NEGATIVE NEGATIVE   Leukocytes, UA TRACE (A) NEGATIVE  Urine microscopic-add on   Collection Time: 03/15/14 12:45 PM  Result Value Ref Range   Squamous Epithelial / LPF FEW (A) RARE   WBC, UA 0-2 <3 WBC/hpf   RBC / HPF 3-6 <3 RBC/hpf   Bacteria, UA FEW (A) RARE   Urine-Other MUCOUS PRESENT   CBC on admission   Collection Time: 03/15/14  3:05 PM  Result Value Ref Range   WBC 15.7 (H) 4.0 - 10.5 K/uL   RBC 3.54 (L) 3.87 - 5.11 MIL/uL   Hemoglobin 11.8 (L) 12.0 - 15.0 g/dL   HCT 78.233.2 (L) 95.636.0 - 21.346.0 %   MCV 93.8 78.0 - 100.0 fL   MCH 33.3 26.0 - 34.0 pg   MCHC 35.5 30.0 - 36.0 g/dL   RDW 08.612.9 57.811.5 - 46.915.5 %   Platelets 191 150 - 400 K/uL    Treatments: IV hydration and antibiotics: ceftriaxone  Hospital Course:  This is a 28 y.o. G2X5284G3P2002 with IUP at 7562w2d admitted for severe back pain on right side. She denied contractions.  No leaking of fluid and no bleeding.  She received pain meds and IV fluid hydration.  On last night she passed several small stones and her  pain improved dramatically.  She was observed, fetal heart rate monitoring remained reassuring, and she had no signs/symptoms of progressing pain.  Her last use of pain meds was >10 hours prev.  She was deemed stable for discharge to home with outpatient follow up.  She was counseled to drink at least 64 ounces of fluid per day.  She reports that she has taken Percocet in the past without difficulty.  Discharge Exam: BP 114/56 mmHg  Pulse 88  Temp(Src) 98 F (36.7 C) (Oral)  Resp 18  Ht 5\' 2"  (1.575 m)  Wt 155 lb (70.308 kg)  BMI 28.34 kg/m2  SpO2 97%  LMP 09/14/2013 General appearance: alert and no distress No CVAT.  No back tenderness noted.  FHR 150's Cat I tracing  Discharge Condition: good  Disposition: 01-Home or Self Care  Discharge Instructions    Discharge activity:  No Restrictions    Complete by:  As directed      Discharge diet:  No restrictions    Complete by:  As directed      No sexual activity restrictions    Complete by:  As directed      Notify physician for a general feeling that "something is not right"    Complete by:  As directed  Notify physician for increase or change in vaginal discharge    Complete by:  As directed      Notify physician for intestinal cramps, with or without diarrhea, sometimes described as "gas pain"    Complete by:  As directed      Notify physician for leaking of fluid    Complete by:  As directed      Notify physician for low, dull backache, unrelieved by heat or Tylenol    Complete by:  As directed      Notify physician for menstrual like cramps    Complete by:  As directed      Notify physician for pelvic pressure    Complete by:  As directed      Notify physician for uterine contractions.  These may be painless and feel like the uterus is tightening or the baby is  "balling up"    Complete by:  As directed      Notify physician for vaginal bleeding    Complete by:  As directed      PRETERM LABOR:  Includes any of the  follwing symptoms that occur between 20 - [redacted] weeks gestation.  If these symptoms are not stopped, preterm labor can result in preterm delivery, placing your baby at risk    Complete by:  As directed             Medication List    STOP taking these medications        Doxylamine-Pyridoxine 10-10 MG Tbec  Commonly known as:  DICLEGIS      TAKE these medications        aspirin EC 81 MG tablet  Take 81 mg by mouth daily.     cephALEXin 500 MG capsule  Commonly known as:  KEFLEX  Take 1 capsule (500 mg total) by mouth 4 (four) times daily.     cyclobenzaprine 10 MG tablet  Commonly known as:  FLEXERIL  Take 1 tablet (10 mg total) by mouth 3 (three) times daily as needed for muscle spasms.     labetalol 100 MG tablet  Commonly known as:  NORMODYNE  Take 1 tablet (100 mg total) by mouth 2 (two) times daily.     oxyCODONE-acetaminophen 5-325 MG per tablet  Commonly known as:  PERCOCET/ROXICET  Take 1-2 tablets by mouth every 6 (six) hours as needed.     PRENATAL VITAMINS PO  Take by mouth daily.           Follow-up Information    Follow up with [facility] In 1 week.   Contact information:   [address] [place] [phone]      Signed: Willodean RosenthalHARRAWAY-SMITH, Bianca Loewenstein M.D. 03/17/2014, 8:08 AM

## 2014-03-17 NOTE — Discharge Instructions (Signed)

## 2014-03-17 NOTE — Progress Notes (Signed)
Post discharge ur review completed. 

## 2014-03-24 ENCOUNTER — Ambulatory Visit (INDEPENDENT_AMBULATORY_CARE_PROVIDER_SITE_OTHER): Payer: Managed Care, Other (non HMO)

## 2014-03-24 ENCOUNTER — Ambulatory Visit (INDEPENDENT_AMBULATORY_CARE_PROVIDER_SITE_OTHER): Payer: Managed Care, Other (non HMO) | Admitting: Obstetrics & Gynecology

## 2014-03-24 ENCOUNTER — Other Ambulatory Visit: Payer: Managed Care, Other (non HMO)

## 2014-03-24 ENCOUNTER — Encounter: Payer: Self-pay | Admitting: Obstetrics & Gynecology

## 2014-03-24 VITALS — BP 130/80 | Wt 158.0 lb

## 2014-03-24 DIAGNOSIS — O0990 Supervision of high risk pregnancy, unspecified, unspecified trimester: Secondary | ICD-10-CM

## 2014-03-24 DIAGNOSIS — Z331 Pregnant state, incidental: Secondary | ICD-10-CM

## 2014-03-24 DIAGNOSIS — Z0184 Encounter for antibody response examination: Secondary | ICD-10-CM

## 2014-03-24 DIAGNOSIS — Z131 Encounter for screening for diabetes mellitus: Secondary | ICD-10-CM

## 2014-03-24 DIAGNOSIS — Z113 Encounter for screening for infections with a predominantly sexual mode of transmission: Secondary | ICD-10-CM

## 2014-03-24 DIAGNOSIS — Z3482 Encounter for supervision of other normal pregnancy, second trimester: Secondary | ICD-10-CM

## 2014-03-24 DIAGNOSIS — Z114 Encounter for screening for human immunodeficiency virus [HIV]: Secondary | ICD-10-CM

## 2014-03-24 DIAGNOSIS — O10912 Unspecified pre-existing hypertension complicating pregnancy, second trimester: Secondary | ICD-10-CM

## 2014-03-24 DIAGNOSIS — Z1389 Encounter for screening for other disorder: Secondary | ICD-10-CM

## 2014-03-24 DIAGNOSIS — O10019 Pre-existing essential hypertension complicating pregnancy, unspecified trimester: Secondary | ICD-10-CM

## 2014-03-24 LAB — POCT URINALYSIS DIPSTICK
Glucose, UA: NEGATIVE
KETONES UA: NEGATIVE
Leukocytes, UA: NEGATIVE
Nitrite, UA: NEGATIVE
Protein, UA: NEGATIVE
RBC UA: NEGATIVE

## 2014-03-24 LAB — CBC
HCT: 34.2 % — ABNORMAL LOW (ref 36.0–46.0)
HEMOGLOBIN: 11.7 g/dL — AB (ref 12.0–15.0)
MCH: 31.7 pg (ref 26.0–34.0)
MCHC: 34.2 g/dL (ref 30.0–36.0)
MCV: 92.7 fL (ref 78.0–100.0)
MPV: 9.3 fL — ABNORMAL LOW (ref 9.4–12.4)
Platelets: 263 10*3/uL (ref 150–400)
RBC: 3.69 MIL/uL — ABNORMAL LOW (ref 3.87–5.11)
RDW: 13.2 % (ref 11.5–15.5)
WBC: 11.8 10*3/uL — AB (ref 4.0–10.5)

## 2014-03-24 NOTE — Progress Notes (Signed)
U/S(27+2wks)-vtx active fetus, EFW 2 lb 3 oz (44th%tile), fluid WNL AFI-11.5cm, SDP-3.3cm, anterior Gr 0 placenta, cx appears closed (3.7cm), female fetus *Maternal Rt Kidney reck due to nephrolithiasis recently- hydronephrosis noted

## 2014-03-24 NOTE — Progress Notes (Signed)
Sonogram is reviewed read and is normal   High Risk Pregnancy Diagnosis(es):   Chronic Hypertension  G3P2002 8442w2d Estimated Date of Delivery: 06/21/14  Blood pressure 130/80, weight 158 lb (71.668 kg), last menstrual period 09/14/2013.  Urinalysis: Not examined   HPI: No complaints     BP weight and urine results all reviewed and noted. Patient reports good fetal movement, denies any bleeding and no rupture of membranes symptoms or regular contractions.  Fundal Height:  28 Fetal Heart rate:  145 Edema:  none  Patient is without complaints. All questions were answered.  Assessment:  5242w2d,   Chronic hypertension  Medication(s) Plans:  Labetalol 100 BID  Treatment Plan:  o changes  Follow up in 2 weeks for OB appt, ob visit

## 2014-03-25 LAB — RPR

## 2014-03-25 LAB — ANTIBODY SCREEN: Antibody Screen: NEGATIVE

## 2014-03-25 LAB — GLUCOSE TOLERANCE, 2 HOURS W/ 1HR
GLUCOSE, 2 HOUR: 98 mg/dL (ref 70–139)
GLUCOSE, FASTING: 80 mg/dL (ref 70–99)
GLUCOSE: 112 mg/dL (ref 70–170)

## 2014-03-25 LAB — HIV ANTIBODY (ROUTINE TESTING W REFLEX): HIV: NONREACTIVE

## 2014-03-25 LAB — HSV 2 ANTIBODY, IGG

## 2014-04-07 ENCOUNTER — Ambulatory Visit (INDEPENDENT_AMBULATORY_CARE_PROVIDER_SITE_OTHER): Payer: Managed Care, Other (non HMO) | Admitting: Obstetrics & Gynecology

## 2014-04-07 VITALS — BP 148/78 | Wt 161.0 lb

## 2014-04-07 DIAGNOSIS — O0993 Supervision of high risk pregnancy, unspecified, third trimester: Secondary | ICD-10-CM

## 2014-04-07 DIAGNOSIS — Z331 Pregnant state, incidental: Secondary | ICD-10-CM

## 2014-04-07 DIAGNOSIS — O10019 Pre-existing essential hypertension complicating pregnancy, unspecified trimester: Secondary | ICD-10-CM

## 2014-04-07 DIAGNOSIS — Z1389 Encounter for screening for other disorder: Secondary | ICD-10-CM

## 2014-04-07 LAB — POCT URINALYSIS DIPSTICK
Glucose, UA: NEGATIVE
KETONES UA: NEGATIVE
LEUKOCYTES UA: NEGATIVE
Nitrite, UA: NEGATIVE
Protein, UA: NEGATIVE
RBC UA: NEGATIVE

## 2014-04-07 MED ORDER — LABETALOL HCL 200 MG PO TABS: 200.0000 mg | ORAL_TABLET | Freq: Two times a day (BID) | ORAL | Status: DC

## 2014-04-07 NOTE — Progress Notes (Signed)
High Risk Pregnancy Diagnosis(es):   Chronic Hypertension  G3P2002 2742w2d Estimated Date of Delivery: 06/21/14  Blood pressure 148/78, weight 161 lb (73.029 kg), last menstrual period 09/14/2013.  Urinalysis: Not examined   HPI: No complaints     BP weight and urine results all reviewed and noted. Patient reports good fetal movement, denies any bleeding and no rupture of membranes symptoms or regular contractions.  Fundal Height:  31 Fetal Heart rate:  140 Edema:  none  Patient is without complaints. All questions were answered.  Assessment:  6442w2d,   Chronic Hypertension  Medication(s) Plans:  Increase labetalol to 200 BID  Treatment Plan:  As above  Follow up in 2 weeks for OB appt, ob appt

## 2014-04-21 ENCOUNTER — Ambulatory Visit (INDEPENDENT_AMBULATORY_CARE_PROVIDER_SITE_OTHER): Payer: Managed Care, Other (non HMO) | Admitting: Women's Health

## 2014-04-21 ENCOUNTER — Encounter: Payer: Self-pay | Admitting: Women's Health

## 2014-04-21 VITALS — BP 120/80 | Wt 161.0 lb

## 2014-04-21 DIAGNOSIS — O26893 Other specified pregnancy related conditions, third trimester: Secondary | ICD-10-CM

## 2014-04-21 DIAGNOSIS — O163 Unspecified maternal hypertension, third trimester: Secondary | ICD-10-CM

## 2014-04-21 DIAGNOSIS — Z1389 Encounter for screening for other disorder: Secondary | ICD-10-CM

## 2014-04-21 DIAGNOSIS — O09893 Supervision of other high risk pregnancies, third trimester: Secondary | ICD-10-CM

## 2014-04-21 DIAGNOSIS — R300 Dysuria: Secondary | ICD-10-CM

## 2014-04-21 DIAGNOSIS — Z331 Pregnant state, incidental: Secondary | ICD-10-CM

## 2014-04-21 DIAGNOSIS — O0990 Supervision of high risk pregnancy, unspecified, unspecified trimester: Secondary | ICD-10-CM

## 2014-04-21 DIAGNOSIS — O10013 Pre-existing essential hypertension complicating pregnancy, third trimester: Secondary | ICD-10-CM

## 2014-04-21 LAB — POCT URINALYSIS DIPSTICK
Glucose, UA: NEGATIVE
Ketones, UA: NEGATIVE
Leukocytes, UA: NEGATIVE
Nitrite, UA: NEGATIVE
Protein, UA: NEGATIVE
RBC UA: NEGATIVE

## 2014-04-21 NOTE — Progress Notes (Signed)
High Risk Pregnancy Diagnosis(es): CHTN G3P2002 935w2d Estimated Date of Delivery: 06/21/14 BP 120/80 mmHg  Wt 161 lb (73.029 kg)  LMP 09/14/2013  Urinalysis: Negative HPI:  Doing well, increased labetalol to 200mg  bid last visit. Urine darker, some dysuria today. Dip neg- will send cx BP, weight, and urine reviewed.  Reports good fm. Denies regular uc's, lof, vb, uti s/s.  Fundal Height:  31 Fetal Heart rate:  135 Edema:  trace  Reviewed ptl s/s, fkc All questions were answered Assessment: 2935w2d CHTN Medication(s) Plans:  Continue labetalol Treatment Plan:  Begin 2x/wk testing next week, IOL @ 39wks Follow up in 1wk for high-risk OB appt and bpp/afi/efw u/s

## 2014-04-23 LAB — URINE CULTURE
Colony Count: NO GROWTH
ORGANISM ID, BACTERIA: NO GROWTH

## 2014-04-25 NOTE — L&D Delivery Note (Signed)
Delivery Note At 10:36 PM a viable and healthy female was delivered via Vaginal, Spontaneous Delivery (Presentation: Right Occiput Anterior).  APGAR: 7, 8; weight pending.   Placenta status: Intact, Spontaneous.  Cord: 3 vessels with the following complications: None.    Anesthesia: Epidural  Episiotomy: None Lacerations: None Suture Repair: n/a Est. Blood Loss (mL): 350  Mom to postpartum.  Baby to Couplet care / Skin to Skin.  29 y.o. [id number] @[redacted]w[redacted]d  pt of FT was admitted [facility] with superimposed preeclampsia based on severe range blood pressures.  Foley bulb was placed and came out 05/30/14 in the am.  Pt had Pitocin for several hours without change then had AROM at 7 pm.  She progressed rapidly to complete and pushed <30 minutes to deliver over intact perineum.  Infant placed in mother's arms immediately after delivery.  Cord clamped by CNM and cut by FOB.  Placenta intact, delivered with cord traction without complication.  Pitocin IV bolus after placenta delivery.  Infant and mother doing well, plan to transfer in stable condition to mother baby for couplet care.   LEFTWICH-KIRBY, LISA 05/30/2014, 11:20 PM

## 2014-04-29 ENCOUNTER — Other Ambulatory Visit: Payer: Self-pay | Admitting: Women's Health

## 2014-04-29 ENCOUNTER — Encounter: Payer: Self-pay | Admitting: Obstetrics & Gynecology

## 2014-04-29 ENCOUNTER — Ambulatory Visit (INDEPENDENT_AMBULATORY_CARE_PROVIDER_SITE_OTHER): Payer: Managed Care, Other (non HMO) | Admitting: Obstetrics & Gynecology

## 2014-04-29 ENCOUNTER — Ambulatory Visit (INDEPENDENT_AMBULATORY_CARE_PROVIDER_SITE_OTHER): Payer: Managed Care, Other (non HMO)

## 2014-04-29 VITALS — BP 140/80 | Wt 161.0 lb

## 2014-04-29 DIAGNOSIS — O09893 Supervision of other high risk pregnancies, third trimester: Secondary | ICD-10-CM

## 2014-04-29 DIAGNOSIS — O10013 Pre-existing essential hypertension complicating pregnancy, third trimester: Secondary | ICD-10-CM

## 2014-04-29 DIAGNOSIS — Z1389 Encounter for screening for other disorder: Secondary | ICD-10-CM

## 2014-04-29 DIAGNOSIS — O163 Unspecified maternal hypertension, third trimester: Secondary | ICD-10-CM

## 2014-04-29 DIAGNOSIS — Z331 Pregnant state, incidental: Secondary | ICD-10-CM

## 2014-04-29 LAB — POCT URINALYSIS DIPSTICK
Glucose, UA: NEGATIVE
Ketones, UA: NEGATIVE
Leukocytes, UA: NEGATIVE
Nitrite, UA: NEGATIVE
Protein, UA: NEGATIVE
RBC UA: NEGATIVE

## 2014-04-29 LAB — US OB FOLLOW UP

## 2014-04-29 NOTE — Progress Notes (Signed)
U/S(32+3wks)-active fetus, meas c/w dates, EFW 4 lb 3 oz (40th%tile), fluid WNL, AFI-20.8 cm SDP-7.1 cm, FHR- 148 bpm, anterior Gr placenta, BPP 8/8, UA doppler RI- 0.60 x2

## 2014-04-29 NOTE — Progress Notes (Signed)
Sonogram is reviewed read and report is done, normal   High Risk Pregnancy Diagnosis(es):   Chronic Hypertension  G3P2002 6377w3d Estimated Date of Delivery: 06/21/14  Blood pressure 140/80, weight 161 lb (73.029 kg), last menstrual period 09/14/2013.  Urinalysis: Negative   HPI: Some periods of increased of increase uterine activity     BP weight and urine results all reviewed and noted. Patient reports good fetal movement, denies any bleeding and no rupture of membranes symptoms or regular contractions.  Fundal Height:  32 Fetal Heart rate:  148 Edema:  none  Patient is without complaints. All questions were answered.  Assessment:  7677w3d,   Chronic hypertension  Medication(s) Plans:  No changes Labetalol 200 BID  Treatment Plan:  Twice weekly evals  Follow up in Friday weeks for OB appt, NST

## 2014-05-02 ENCOUNTER — Encounter: Payer: Self-pay | Admitting: Obstetrics & Gynecology

## 2014-05-02 ENCOUNTER — Ambulatory Visit (INDEPENDENT_AMBULATORY_CARE_PROVIDER_SITE_OTHER): Payer: Managed Care, Other (non HMO) | Admitting: Obstetrics & Gynecology

## 2014-05-02 VITALS — BP 130/90 | Wt 160.0 lb

## 2014-05-02 DIAGNOSIS — O10019 Pre-existing essential hypertension complicating pregnancy, unspecified trimester: Secondary | ICD-10-CM

## 2014-05-02 DIAGNOSIS — Z1389 Encounter for screening for other disorder: Secondary | ICD-10-CM

## 2014-05-02 DIAGNOSIS — Z331 Pregnant state, incidental: Secondary | ICD-10-CM

## 2014-05-02 DIAGNOSIS — O09893 Supervision of other high risk pregnancies, third trimester: Secondary | ICD-10-CM

## 2014-05-02 LAB — POCT URINALYSIS DIPSTICK
Blood, UA: NEGATIVE
Glucose, UA: NEGATIVE
Ketones, UA: NEGATIVE
LEUKOCYTES UA: NEGATIVE
NITRITE UA: NEGATIVE
PROTEIN UA: NEGATIVE

## 2014-05-02 NOTE — Progress Notes (Signed)
Reactive NST   High Risk Pregnancy Diagnosis(es):   Chronic Hypertension  G3P2002 301w6d Estimated Date of Delivery: 06/21/14  Blood pressure 130/90, weight 160 lb (72.576 kg), last menstrual period 09/14/2013.  Urinalysis: Negative   HPI: No complaints     BP weight and urine results all reviewed and noted. Patient reports good fetal movement, denies any bleeding and no rupture of membranes symptoms or regular contractions.  Fundal Height:  32 Fetal Heart rate:  145 Edema:  none  Patient is without complaints. All questions were answered.  Assessment:  551w6d,   Chronic hypertension  Medication(s) Plans:  No changes  Treatment Plan:  Twice weekly surveillance  Follow up in Monday weeks for OB appt, sonogram

## 2014-05-05 ENCOUNTER — Encounter: Payer: Managed Care, Other (non HMO) | Admitting: Obstetrics & Gynecology

## 2014-05-05 ENCOUNTER — Other Ambulatory Visit: Payer: Managed Care, Other (non HMO)

## 2014-05-05 ENCOUNTER — Ambulatory Visit (INDEPENDENT_AMBULATORY_CARE_PROVIDER_SITE_OTHER): Payer: Managed Care, Other (non HMO) | Admitting: Obstetrics & Gynecology

## 2014-05-05 VITALS — BP 150/90 | Wt 162.0 lb

## 2014-05-05 DIAGNOSIS — O10019 Pre-existing essential hypertension complicating pregnancy, unspecified trimester: Secondary | ICD-10-CM

## 2014-05-05 DIAGNOSIS — O09893 Supervision of other high risk pregnancies, third trimester: Secondary | ICD-10-CM

## 2014-05-05 DIAGNOSIS — Z331 Pregnant state, incidental: Secondary | ICD-10-CM

## 2014-05-05 DIAGNOSIS — Z1389 Encounter for screening for other disorder: Secondary | ICD-10-CM

## 2014-05-05 LAB — POCT URINALYSIS DIPSTICK
Blood, UA: NEGATIVE
GLUCOSE UA: NEGATIVE
Ketones, UA: NEGATIVE
LEUKOCYTES UA: NEGATIVE
Nitrite, UA: NEGATIVE
Protein, UA: NEGATIVE

## 2014-05-05 NOTE — Progress Notes (Signed)
Reactive NST   High Risk Pregnancy Diagnosis(es):   Chronic Hypertension  G3P2002 465w2d Estimated Date of Delivery: 06/21/14  Blood pressure 150/90, weight 162 lb (73.483 kg), last menstrual period 09/14/2013.  Urinalysis: Negative   HPI: Feeling pressure and some contractions     BP weight and urine results all reviewed and noted. Patient reports good fetal movement, denies any bleeding and no rupture of membranes symptoms or regular contractions.  Fundal Height:  33 Fetal Heart rate:  135 Edema:  none  Patient is without complaints. All questions were answered.  Assessment:  7265w2d,   Chronic hypertension  Medication(s) Plans:  No changes today, if stays up will increease to 400 BID  Treatment Plan:  Twice weekly surveillance  Follow up in thursday weeks for OB appt, sonogram

## 2014-05-08 ENCOUNTER — Encounter: Payer: Self-pay | Admitting: Obstetrics & Gynecology

## 2014-05-08 ENCOUNTER — Ambulatory Visit (INDEPENDENT_AMBULATORY_CARE_PROVIDER_SITE_OTHER): Payer: Managed Care, Other (non HMO)

## 2014-05-08 ENCOUNTER — Ambulatory Visit (INDEPENDENT_AMBULATORY_CARE_PROVIDER_SITE_OTHER): Payer: Managed Care, Other (non HMO) | Admitting: Obstetrics & Gynecology

## 2014-05-08 ENCOUNTER — Other Ambulatory Visit: Payer: Self-pay | Admitting: Obstetrics & Gynecology

## 2014-05-08 ENCOUNTER — Other Ambulatory Visit: Payer: Managed Care, Other (non HMO) | Admitting: Obstetrics & Gynecology

## 2014-05-08 VITALS — BP 140/80 | Wt 159.0 lb

## 2014-05-08 DIAGNOSIS — Z331 Pregnant state, incidental: Secondary | ICD-10-CM

## 2014-05-08 DIAGNOSIS — O09893 Supervision of other high risk pregnancies, third trimester: Secondary | ICD-10-CM

## 2014-05-08 DIAGNOSIS — O10019 Pre-existing essential hypertension complicating pregnancy, unspecified trimester: Secondary | ICD-10-CM

## 2014-05-08 DIAGNOSIS — O10013 Pre-existing essential hypertension complicating pregnancy, third trimester: Secondary | ICD-10-CM

## 2014-05-08 DIAGNOSIS — Z1389 Encounter for screening for other disorder: Secondary | ICD-10-CM

## 2014-05-08 DIAGNOSIS — O163 Unspecified maternal hypertension, third trimester: Secondary | ICD-10-CM

## 2014-05-08 LAB — POCT URINALYSIS DIPSTICK
GLUCOSE UA: NEGATIVE
Ketones, UA: NEGATIVE
LEUKOCYTES UA: NEGATIVE
NITRITE UA: NEGATIVE
Protein, UA: NEGATIVE
RBC UA: NEGATIVE

## 2014-05-08 NOTE — Progress Notes (Signed)
U/S(33+5wks)-vtx active fetus, BPP 8/8, UA Doppler RI-0.69 x 2, fluid WNL AFI-20.6cm SDp-7.3cm, anterior Gr 1 placenta

## 2014-05-08 NOTE — Progress Notes (Signed)
Sonogram is reviewed read and report done: Normal   High Risk Pregnancy Diagnosis(es):   Chronic Hypertension  G3P2002 2724w5d Estimated Date of Delivery: 06/21/14  Blood pressure 140/80, weight 159 lb (72.122 kg), last menstrual period 09/14/2013.  Urinalysis: Not examined   HPI: Feels better, less pressure     BP weight and urine results all reviewed and noted. Patient reports good fetal movement, denies any bleeding and no rupture of membranes symptoms or regular contractions.  Fundal Height:  33 Fetal Heart rate:  145 Edema:  none  Patient is without complaints. All questions were answered.  Assessment:  5024w5d,   Chronic hypertension  Medication(s) Plans:  No changes  Treatment Plan:  Twice weekly tetsting  Follow up in Monday weeks for OB appt, NST

## 2014-05-09 ENCOUNTER — Other Ambulatory Visit: Payer: Self-pay | Admitting: Obstetrics & Gynecology

## 2014-05-09 DIAGNOSIS — O163 Unspecified maternal hypertension, third trimester: Secondary | ICD-10-CM

## 2014-05-12 ENCOUNTER — Ambulatory Visit (INDEPENDENT_AMBULATORY_CARE_PROVIDER_SITE_OTHER): Payer: Managed Care, Other (non HMO)

## 2014-05-12 ENCOUNTER — Encounter: Payer: Self-pay | Admitting: Obstetrics & Gynecology

## 2014-05-12 ENCOUNTER — Other Ambulatory Visit: Payer: Managed Care, Other (non HMO) | Admitting: Obstetrics & Gynecology

## 2014-05-12 ENCOUNTER — Ambulatory Visit (INDEPENDENT_AMBULATORY_CARE_PROVIDER_SITE_OTHER): Payer: Managed Care, Other (non HMO) | Admitting: Obstetrics & Gynecology

## 2014-05-12 VITALS — BP 130/80 | Wt 159.0 lb

## 2014-05-12 DIAGNOSIS — O09893 Supervision of other high risk pregnancies, third trimester: Secondary | ICD-10-CM

## 2014-05-12 DIAGNOSIS — O163 Unspecified maternal hypertension, third trimester: Secondary | ICD-10-CM

## 2014-05-12 DIAGNOSIS — O10019 Pre-existing essential hypertension complicating pregnancy, unspecified trimester: Secondary | ICD-10-CM

## 2014-05-12 DIAGNOSIS — Z1389 Encounter for screening for other disorder: Secondary | ICD-10-CM

## 2014-05-12 DIAGNOSIS — O10013 Pre-existing essential hypertension complicating pregnancy, third trimester: Secondary | ICD-10-CM

## 2014-05-12 DIAGNOSIS — Z331 Pregnant state, incidental: Secondary | ICD-10-CM

## 2014-05-12 LAB — POCT URINALYSIS DIPSTICK
Glucose, UA: NEGATIVE
Ketones, UA: NEGATIVE
LEUKOCYTES UA: NEGATIVE
Nitrite, UA: NEGATIVE
Protein, UA: NEGATIVE
RBC UA: NEGATIVE

## 2014-05-12 LAB — US OB FOLLOW UP

## 2014-05-12 NOTE — Progress Notes (Signed)
Sonogram is normal report is done   High Risk Pregnancy Diagnosis(es):   Chronic hypertension  G3P2002 2273w2d Estimated Date of Delivery: 06/21/14  Blood pressure 130/80, weight 159 lb (72.122 kg), last menstrual period 09/14/2013.  Urinalysis: Negative   HPI: BP excellent, no complaints     BP weight and urine results all reviewed and noted. Patient reports good fetal movement, denies any bleeding and no rupture of membranes symptoms or regular contractions.  Fundal Height:  34 Fetal Heart rate:  161 Edema:  none  Patient is without complaints. All questions were answered.  Assessment:  1973w2d,   Chronic hypertension  Medication(s) Plans:  No changes  Treatment Plan:  Continue current care plan  Follow up in Thursday weeks for OB appt, NST

## 2014-05-12 NOTE — Progress Notes (Signed)
U/S(34+2wks)-vtx active fetus, EFw 5 lb 1 oz (37th%tile), fluid WNLAFI-18.4cm SDP-6.8cm, anterior Gr 1 placenta, FHR- 161 bpm, female fetus, UA Doppler RI- 0.54 & 0.56

## 2014-05-15 ENCOUNTER — Ambulatory Visit (INDEPENDENT_AMBULATORY_CARE_PROVIDER_SITE_OTHER): Payer: Managed Care, Other (non HMO) | Admitting: Obstetrics & Gynecology

## 2014-05-15 ENCOUNTER — Encounter: Payer: Self-pay | Admitting: Obstetrics & Gynecology

## 2014-05-15 VITALS — BP 150/80 | Wt 160.0 lb

## 2014-05-15 DIAGNOSIS — Z331 Pregnant state, incidental: Secondary | ICD-10-CM

## 2014-05-15 DIAGNOSIS — Z1389 Encounter for screening for other disorder: Secondary | ICD-10-CM

## 2014-05-15 DIAGNOSIS — O10019 Pre-existing essential hypertension complicating pregnancy, unspecified trimester: Secondary | ICD-10-CM

## 2014-05-15 DIAGNOSIS — O09893 Supervision of other high risk pregnancies, third trimester: Secondary | ICD-10-CM

## 2014-05-15 LAB — POCT URINALYSIS DIPSTICK
Blood, UA: NEGATIVE
GLUCOSE UA: NEGATIVE
KETONES UA: 3
Leukocytes, UA: NEGATIVE
Nitrite, UA: NEGATIVE

## 2014-05-15 NOTE — Progress Notes (Signed)
Reactive NST   High Risk Pregnancy Diagnosis(es):   Chronic Hypertension  G3P2002 6023w5d Estimated Date of Delivery: 06/21/14  Blood pressure 150/80, weight 160 lb (72.576 kg), last menstrual period 09/14/2013.  Urinalysis: Negative   HPI: No complaints     BP weight and urine results all reviewed and noted. Patient reports good fetal movement, denies any bleeding and no rupture of membranes symptoms or regular contractions.  Fundal Height:  34 Fetal Heart rate:  145 Edema:  none  Patient is without complaints. All questions were answered.  Assessment:  7423w5d,   Chronic Hypertension  Medication(s) Plans:  No changes  Treatment Plan:  Twice weekly testing  Follow up in Monday weeks for OB appt, sonogram

## 2014-05-19 ENCOUNTER — Other Ambulatory Visit: Payer: Self-pay | Admitting: Obstetrics & Gynecology

## 2014-05-19 ENCOUNTER — Ambulatory Visit (INDEPENDENT_AMBULATORY_CARE_PROVIDER_SITE_OTHER): Payer: Managed Care, Other (non HMO) | Admitting: Obstetrics & Gynecology

## 2014-05-19 ENCOUNTER — Ambulatory Visit (INDEPENDENT_AMBULATORY_CARE_PROVIDER_SITE_OTHER): Payer: Managed Care, Other (non HMO)

## 2014-05-19 ENCOUNTER — Encounter: Payer: Self-pay | Admitting: Obstetrics & Gynecology

## 2014-05-19 VITALS — BP 140/88 | Wt 162.0 lb

## 2014-05-19 DIAGNOSIS — O10019 Pre-existing essential hypertension complicating pregnancy, unspecified trimester: Secondary | ICD-10-CM

## 2014-05-19 DIAGNOSIS — Z1389 Encounter for screening for other disorder: Secondary | ICD-10-CM

## 2014-05-19 DIAGNOSIS — O09893 Supervision of other high risk pregnancies, third trimester: Secondary | ICD-10-CM

## 2014-05-19 DIAGNOSIS — Z331 Pregnant state, incidental: Secondary | ICD-10-CM

## 2014-05-19 DIAGNOSIS — O163 Unspecified maternal hypertension, third trimester: Secondary | ICD-10-CM

## 2014-05-19 DIAGNOSIS — O10013 Pre-existing essential hypertension complicating pregnancy, third trimester: Secondary | ICD-10-CM

## 2014-05-19 LAB — POCT URINALYSIS DIPSTICK
Blood, UA: NEGATIVE
Glucose, UA: NEGATIVE
Ketones, UA: NEGATIVE
Leukocytes, UA: NEGATIVE
Nitrite, UA: NEGATIVE
PROTEIN UA: NEGATIVE

## 2014-05-19 NOTE — Progress Notes (Signed)
U/S(35+2wks)-vtx active fetus, BPP 8/8, fluid WNL AFI- 15.9cm, SDP- 6.3cm, FHR-133 bpm, UA doppler RI- 0.61 & 0.64, anterior Gr 1  placenta

## 2014-05-19 NOTE — Addendum Note (Signed)
Addended by: Criss AlvinePULLIAM, CHRYSTAL G on: 05/19/2014 03:30 PM   Modules accepted: Orders

## 2014-05-19 NOTE — Progress Notes (Signed)
Sonogram is reviewed read and report is done   High Risk Pregnancy Diagnosis(es):   Chronic hypertension  G3P2002 1672w2d Estimated Date of Delivery: 06/21/14  Blood pressure 140/88, weight 162 lb (73.483 kg), last menstrual period 09/14/2013.  Urinalysis: Negative   HPI: Feels a bit better now, told to get something else to eat and drink milk     BP weight and urine results all reviewed and noted. Patient reports good fetal movement, denies any bleeding and no rupture of membranes symptoms or regular contractions.  Fundal Height:  36 Fetal Heart rate:  133 Edema:  none  Patient is without complaints. All questions were answered.  Assessment:  7572w2d,   Chronic hypertension  Medication(s) Plans:  No changes  Treatment Plan:  Twice weekly surveillance  Follow up in thursday weeks for OB appt, nst

## 2014-05-22 ENCOUNTER — Ambulatory Visit (INDEPENDENT_AMBULATORY_CARE_PROVIDER_SITE_OTHER): Payer: Managed Care, Other (non HMO) | Admitting: Obstetrics & Gynecology

## 2014-05-22 ENCOUNTER — Encounter: Payer: Self-pay | Admitting: Obstetrics & Gynecology

## 2014-05-22 VITALS — BP 140/80 | Wt 161.0 lb

## 2014-05-22 DIAGNOSIS — Z331 Pregnant state, incidental: Secondary | ICD-10-CM

## 2014-05-22 DIAGNOSIS — O10019 Pre-existing essential hypertension complicating pregnancy, unspecified trimester: Secondary | ICD-10-CM

## 2014-05-22 DIAGNOSIS — Z1389 Encounter for screening for other disorder: Secondary | ICD-10-CM

## 2014-05-22 DIAGNOSIS — O09893 Supervision of other high risk pregnancies, third trimester: Secondary | ICD-10-CM

## 2014-05-22 LAB — POCT URINALYSIS DIPSTICK
Glucose, UA: NEGATIVE
Ketones, UA: NEGATIVE
LEUKOCYTES UA: NEGATIVE
Nitrite, UA: NEGATIVE
Protein, UA: NEGATIVE
RBC UA: NEGATIVE

## 2014-05-22 NOTE — Progress Notes (Signed)
Reactive NST   High Risk Pregnancy Diagnosis(es):   Chronic hypertension  G3P2002 2544w5d Estimated Date of Delivery: 06/21/14  Blood pressure 140/80, weight 161 lb (73.029 kg), last menstrual period 09/14/2013.  Urinalysis: Negative   HPI: No problems     BP weight and urine results all reviewed and noted. Patient reports good fetal movement, denies any bleeding and no rupture of membranes symptoms or regular contractions.  Fundal Height:  37 Fetal Heart rate:  130 Edema:  none  Patient is without complaints. All questions were answered.  Assessment:  3144w5d,   Chronic hypertension  Medication(s) Plans:  No changes  Treatment Plan:  Twice weekly surveillance  Follow up in monday weeks for OB appt, sonogram

## 2014-05-26 ENCOUNTER — Encounter: Payer: Self-pay | Admitting: Obstetrics & Gynecology

## 2014-05-26 ENCOUNTER — Other Ambulatory Visit: Payer: Self-pay | Admitting: Women's Health

## 2014-05-26 ENCOUNTER — Ambulatory Visit (INDEPENDENT_AMBULATORY_CARE_PROVIDER_SITE_OTHER): Payer: Managed Care, Other (non HMO)

## 2014-05-26 ENCOUNTER — Inpatient Hospital Stay (HOSPITAL_COMMUNITY)
Admission: AD | Admit: 2014-05-26 | Discharge: 2014-05-26 | Disposition: A | Discharge status: Home / Self Care | Payer: Managed Care, Other (non HMO) | Source: Ambulatory Visit | Attending: Obstetrics and Gynecology | Admitting: Obstetrics and Gynecology

## 2014-05-26 ENCOUNTER — Encounter (HOSPITAL_COMMUNITY): Payer: Self-pay | Admitting: General Practice

## 2014-05-26 ENCOUNTER — Ambulatory Visit (INDEPENDENT_AMBULATORY_CARE_PROVIDER_SITE_OTHER): Payer: Managed Care, Other (non HMO) | Admitting: Obstetrics & Gynecology

## 2014-05-26 VITALS — BP 150/100 | Wt 162.0 lb

## 2014-05-26 DIAGNOSIS — Z331 Pregnant state, incidental: Secondary | ICD-10-CM

## 2014-05-26 DIAGNOSIS — F17210 Nicotine dependence, cigarettes, uncomplicated: Secondary | ICD-10-CM | POA: Diagnosis not present

## 2014-05-26 DIAGNOSIS — O163 Unspecified maternal hypertension, third trimester: Secondary | ICD-10-CM

## 2014-05-26 DIAGNOSIS — O09893 Supervision of other high risk pregnancies, third trimester: Secondary | ICD-10-CM

## 2014-05-26 DIAGNOSIS — O10913 Unspecified pre-existing hypertension complicating pregnancy, third trimester: Secondary | ICD-10-CM

## 2014-05-26 DIAGNOSIS — R519 Headache, unspecified: Secondary | ICD-10-CM

## 2014-05-26 DIAGNOSIS — O10019 Pre-existing essential hypertension complicating pregnancy, unspecified trimester: Secondary | ICD-10-CM

## 2014-05-26 DIAGNOSIS — O99333 Smoking (tobacco) complicating pregnancy, third trimester: Secondary | ICD-10-CM | POA: Insufficient documentation

## 2014-05-26 DIAGNOSIS — Z3A36 36 weeks gestation of pregnancy: Secondary | ICD-10-CM | POA: Insufficient documentation

## 2014-05-26 DIAGNOSIS — O10013 Pre-existing essential hypertension complicating pregnancy, third trimester: Secondary | ICD-10-CM

## 2014-05-26 DIAGNOSIS — R51 Headache: Secondary | ICD-10-CM

## 2014-05-26 DIAGNOSIS — Z1389 Encounter for screening for other disorder: Secondary | ICD-10-CM

## 2014-05-26 LAB — COMPREHENSIVE METABOLIC PANEL
ALT: 21 U/L (ref 0–35)
AST: 18 U/L (ref 0–37)
Albumin: 3 g/dL — ABNORMAL LOW (ref 3.5–5.2)
Alkaline Phosphatase: 179 U/L — ABNORMAL HIGH (ref 39–117)
Anion gap: 7 (ref 5–15)
BILIRUBIN TOTAL: 0.3 mg/dL (ref 0.3–1.2)
BUN: 6 mg/dL (ref 6–23)
CO2: 22 mmol/L (ref 19–32)
CREATININE: 0.46 mg/dL — AB (ref 0.50–1.10)
Calcium: 8 mg/dL — ABNORMAL LOW (ref 8.4–10.5)
Chloride: 107 mmol/L (ref 96–112)
GFR calc non Af Amer: >90 mL/min (ref >90)
GLUCOSE: 84 mg/dL (ref 70–99)
Potassium: 3.1 mmol/L — ABNORMAL LOW (ref 3.5–5.1)
SODIUM: 136 mmol/L (ref 135–145)
Total Protein: 6.6 g/dL (ref 6.0–8.3)

## 2014-05-26 LAB — URINE MICROSCOPIC-ADD ON

## 2014-05-26 LAB — POCT URINALYSIS DIPSTICK
Blood, UA: NEGATIVE
GLUCOSE UA: NEGATIVE
Ketones, UA: NEGATIVE
LEUKOCYTES UA: NEGATIVE
Nitrite, UA: NEGATIVE
Protein, UA: NEGATIVE

## 2014-05-26 LAB — CBC
HCT: 33.5 % — ABNORMAL LOW (ref 36.0–46.0)
Hemoglobin: 11.7 g/dL — ABNORMAL LOW (ref 12.0–15.0)
MCH: 32.1 pg (ref 26.0–34.0)
MCHC: 34.9 g/dL (ref 30.0–36.0)
MCV: 92 fL (ref 78.0–100.0)
PLATELETS: 223 10*3/uL (ref 150–400)
RBC: 3.64 MIL/uL — AB (ref 3.87–5.11)
RDW: 13.2 % (ref 11.5–15.5)
WBC: 13.7 10*3/uL — ABNORMAL HIGH (ref 4.0–10.5)

## 2014-05-26 LAB — US OB FOLLOW UP

## 2014-05-26 LAB — URINALYSIS, ROUTINE W REFLEX MICROSCOPIC
Bilirubin Urine: NEGATIVE
Glucose, UA: NEGATIVE mg/dL
Hgb urine dipstick: NEGATIVE
KETONES UR: 15 mg/dL — AB
NITRITE: NEGATIVE
PROTEIN: NEGATIVE mg/dL
Specific Gravity, Urine: 1.01 (ref 1.005–1.030)
UROBILINOGEN UA: 4 mg/dL — AB (ref 0.0–1.0)
pH: 6 (ref 5.0–8.0)

## 2014-05-26 LAB — PROTEIN / CREATININE RATIO, URINE
CREATININE, URINE: 59 mg/dL
PROTEIN CREATININE RATIO: 0.22 — AB (ref 0.00–0.15)
TOTAL PROTEIN, URINE: 13 mg/dL

## 2014-05-26 MED ORDER — ACETAMINOPHEN 500 MG PO TABS: 1000.0000 mg | ORAL_TABLET | Freq: Once | ORAL | Status: DC

## 2014-05-26 NOTE — Progress Notes (Signed)
Sonogram is reviewed and report done, all normal BPP 8/8 with excellent Doppler flow and good growth, fluid   High Risk Pregnancy Diagnosis(es):   Chronic Hypertension  G3P2002 876w2d Estimated Date of Delivery: 06/21/14  Blood pressure 150/100, weight 162 lb (73.483 kg), last menstrual period 09/14/2013.  Urinalysis: Negative   HPI: Has 24+ hour history of bifrontal headaches with radiation posteriorly with unusual visual disturbance, flashing colored lights, looks like bugs in front of her eyes She is not a headache sufferer in general   Some nausea, no RUQ pain   BP weight and urine results all reviewed and noted. Patient reports good fetal movement, denies any bleeding and no rupture of membranes symptoms or regular contractions.  Fundal Height:  36 Fetal Heart rate:  143 Edema:  none  Patient is without complaints. All questions were answered.  Assessment:  6876w2d,   Chronic hypertension with new onset visual changes  Medication(s) Plans:  No changes  Treatment Plan:  Going to the [facility] for labs and observation  Follow up in thursday weeks for OB appt, NST

## 2014-05-26 NOTE — MAU Provider Note (Signed)
History     CSN: [id number]  Arrival date and time: 05/26/14 1741      Chief Complaint  Patient presents with  . Blood pressure EVAL    HPI Bianca [name] 29 y.o. G3P2002 @ 2182w2d sent from office FT for preeclampsia evaluation. She reported severe frontal headache radiating to occiput and associated with visual disturbance including intermittent scotomata and flashing lightsfor the past 3 days. Visual acuity normal (with corrective lenses) and no scotomata now. H/A is like a tight band but is easing off now. Took tylenol 650 mg 4 hr ago. She is being followed for chronic hypertension on labetalol 200 mg twice a day. Fetal testing has all been reassuring. She reports good fetal movement and has no vaginal bleeding or leakage of fluid. Denies abdominal pain or contractions.    Past Medical History  Diagnosis Date  . Hypertension   . History of recurrent UTIs     Past Surgical History  Procedure Laterality Date  . No past surgeries      Family History  Problem Relation Age of Onset  . Heart disease Paternal Grandmother   . Cancer Paternal Grandmother     breast  . Thyroid disease Father   . Cancer Father     lung  . Hodgkin's lymphoma Father   . Kidney disease Mother     GN3N  . Other Maternal Grandmother     Guillian Barre syndrome    History  Substance Use Topics  . Smoking status: Current Every Day Smoker    Types: Cigarettes  . Smokeless tobacco: Never Used  . Alcohol Use: No    Allergies:  Allergies  Allergen Reactions  . Codeine Hives and Shortness Of Breath    Prescriptions prior to admission  Medication Sig Dispense Refill Last Dose  . aspirin EC 81 MG tablet Take 81 mg by mouth daily.   Taking  . labetalol (NORMODYNE) 200 MG tablet Take 1 tablet (200 mg total) by mouth 2 (two) times daily. 60 tablet 11 Taking  . Prenatal Multivit-Min-Fe-FA (PRENATAL VITAMINS PO) Take by mouth daily.   Taking    ROS Pertinent items in history of present  illness  Physical Exam   Blood pressure 153/84, pulse 94, temperature 98 F (36.7 C), resp. rate 18, last menstrual period 09/14/2013. Filed Vitals:   05/26/14 1948 05/26/14 1950 05/26/14 1953 05/26/14 1958  BP:  120/72    Pulse: 81 77 85 83  Temp:      Resp:      SpO2:      Range 120-153/84-100  Physical Exam  Nursing note and vitals reviewed. Constitutional: She appears well-developed and well-nourished. No distress.  HENT:  Head: Normocephalic.  Eyes: EOM are normal. Pupils are equal, round, and reactive to light.  Neck: Neck supple.  Cardiovascular: Normal rate, regular rhythm and normal heart sounds.   Respiratory: Effort normal and breath sounds normal.  GI: Soft. There is no tenderness.  Musculoskeletal: Normal range of motion. She exhibits edema.  Trace edema  Neurological: She is alert. She has normal reflexes. She displays normal reflexes.  DTRs 2+, no clonus   Skin: Skin is warm and dry.   LAB RESULTS Results for orders placed or performed during the hospital encounter of 05/26/14 (from the past 24 hour(s))  Urinalysis, Routine w reflex microscopic     Status: Abnormal   Collection Time: 05/26/14  6:03 PM  Result Value Ref Range   Color, Urine YELLOW YELLOW   APPearance CLEAR  CLEAR   Specific Gravity, Urine 1.010 1.005 - 1.030   pH 6.0 5.0 - 8.0   Glucose, UA NEGATIVE NEGATIVE mg/dL   Hgb urine dipstick NEGATIVE NEGATIVE   Bilirubin Urine NEGATIVE NEGATIVE   Ketones, ur 15 (A) NEGATIVE mg/dL   Protein, ur NEGATIVE NEGATIVE mg/dL   Urobilinogen, UA 4.0 (H) 0.0 - 1.0 mg/dL   Nitrite NEGATIVE NEGATIVE   Leukocytes, UA TRACE (A) NEGATIVE  Protein / creatinine ratio, urine     Status: Abnormal   Collection Time: 05/26/14  6:03 PM  Result Value Ref Range   Creatinine, Urine 59.00 mg/dL   Total Protein, Urine 13 mg/dL   Protein Creatinine Ratio 0.22 (H) 0.00 - 0.15  Urine microscopic-add on     Status: Abnormal   Collection Time: 05/26/14  6:03 PM  Result  Value Ref Range   Squamous Epithelial / LPF MANY (A) RARE   WBC, UA 0-2 <3 WBC/hpf   RBC / HPF 0-2 <3 RBC/hpf   Bacteria, UA FEW (A) RARE  Comprehensive metabolic panel     Status: Abnormal   Collection Time: 05/26/14  6:30 PM  Result Value Ref Range   Sodium 136 135 - 145 mmol/L   Potassium 3.1 (L) 3.5 - 5.1 mmol/L   Chloride 107 96 - 112 mmol/L   CO2 22 19 - 32 mmol/L   Glucose, Bld 84 70 - 99 mg/dL   BUN 6 6 - 23 mg/dL   Creatinine, Ser 1.61 (L) 0.50 - 1.10 mg/dL   Calcium 8.0 (L) 8.4 - 10.5 mg/dL   Total Protein 6.6 6.0 - 8.3 g/dL   Albumin 3.0 (L) 3.5 - 5.2 g/dL   AST 18 0 - 37 U/L   ALT 21 0 - 35 U/L   Alkaline Phosphatase 179 (H) 39 - 117 U/L   Total Bilirubin 0.3 0.3 - 1.2 mg/dL   GFR calc non Af Amer >90 >90 mL/min   GFR calc Af Amer >90 >90 mL/min   Anion gap 7 5 - 15  CBC     Status: Abnormal   Collection Time: 05/26/14  6:30 PM  Result Value Ref Range   WBC 13.7 (H) 4.0 - 10.5 K/uL   RBC 3.64 (L) 3.87 - 5.11 MIL/uL   Hemoglobin 11.7 (L) 12.0 - 15.0 g/dL   HCT 09.6 (L) 04.5 - 40.9 %   MCV 92.0 78.0 - 100.0 fL   MCH 32.1 26.0 - 34.0 pg   MCHC 34.9 30.0 - 36.0 g/dL   RDW 81.1 91.4 - 78.2 %   Platelets 223 150 - 400 K/uL    MAU Course  Procedures   EFM: baseline 130-135, moderate variability, accelerations present, no deceleerations Toco: irregular, mild UCs C/W Dr. Jolayne Panther  Assessment and Plan  G3P2002 at [redacted]w[redacted]d CHTN without evident superimposed preeclampsia Reactive NST  Discharge home with preeclampsia precautions. FM awareness.  Follow-up Information    Follow up with [facility] On 05/29/2014.   Why:  Keep your scheduled prenatal appointment   Contact information:   [address] [name] [address] [phone]       Medication List    TAKE these medications        acetaminophen 500 MG tablet  Commonly known as:  TYLENOL  Take 1,000 mg by mouth every 6 (six) hours as needed for headache.     aspirin 81 MG  tablet  Take 81 mg by mouth daily.     labetalol 200  MG tablet  Commonly known as:  NORMODYNE  Take 1 tablet (200 mg total) by mouth 2 (two) times daily.     PRENATAL VITAMINS PO  Take by mouth daily.        Noha Karasik 05/26/2014, 6:32 PM

## 2014-05-26 NOTE — Discharge Instructions (Signed)
Fetal Movement Counts °Patient Name: __________________________________________________ Patient Due Date: ____________________ °Performing a fetal movement count is highly recommended in high-risk pregnancies, but it is good for every pregnant woman to do. Your health care provider may ask you to start counting fetal movements at 28 weeks of the pregnancy. Fetal movements often increase: °· After eating a full meal. °· After physical activity. °· After eating or drinking something sweet or cold. °· At rest. °Pay attention to when you feel the baby is most active. This will help you notice a pattern of your baby's sleep and wake cycles and what factors contribute to an increase in fetal movement. It is important to perform a fetal movement count at the same time each day when your baby is normally most active.  °HOW TO COUNT FETAL MOVEMENTS °1. Find a quiet and comfortable area to sit or lie down on your left side. Lying on your left side provides the best blood and oxygen circulation to your baby. °2. Write down the day and time on a sheet of paper or in a journal. °3. Start counting kicks, flutters, swishes, rolls, or jabs in a 2-hour period. You should feel at least 10 movements within 2 hours. °4. If you do not feel 10 movements in 2 hours, wait 2-3 hours and count again. Look for a change in the pattern or not enough counts in 2 hours. °SEEK MEDICAL CARE IF: °· You feel less than 10 counts in 2 hours, tried twice. °· There is no movement in over an hour. °· The pattern is changing or taking longer each day to reach 10 counts in 2 hours. °· You feel the baby is not moving as he or she usually does. °Date: ____________ Movements: ____________ Start time: ____________ Finish time: ____________  °Date: ____________ Movements: ____________ Start time: ____________ Finish time: ____________ °Date: ____________ Movements: ____________ Start time: ____________ Finish time: ____________ °Date: ____________ Movements:  ____________ Start time: ____________ Finish time: ____________ °Date: ____________ Movements: ____________ Start time: ____________ Finish time: ____________ °Date: ____________ Movements: ____________ Start time: ____________ Finish time: ____________ °Date: ____________ Movements: ____________ Start time: ____________ Finish time: ____________ °Date: ____________ Movements: ____________ Start time: ____________ Finish time: ____________  °Date: ____________ Movements: ____________ Start time: ____________ Finish time: ____________ °Date: ____________ Movements: ____________ Start time: ____________ Finish time: ____________ °Date: ____________ Movements: ____________ Start time: ____________ Finish time: ____________ °Date: ____________ Movements: ____________ Start time: ____________ Finish time: ____________ °Date: ____________ Movements: ____________ Start time: ____________ Finish time: ____________ °Date: ____________ Movements: ____________ Start time: ____________ Finish time: ____________ °Date: ____________ Movements: ____________ Start time: ____________ Finish time: ____________  °Date: ____________ Movements: ____________ Start time: ____________ Finish time: ____________ °Date: ____________ Movements: ____________ Start time: ____________ Finish time: ____________ °Date: ____________ Movements: ____________ Start time: ____________ Finish time: ____________ °Date: ____________ Movements: ____________ Start time: ____________ Finish time: ____________ °Date: ____________ Movements: ____________ Start time: ____________ Finish time: ____________ °Date: ____________ Movements: ____________ Start time: ____________ Finish time: ____________ °Date: ____________ Movements: ____________ Start time: ____________ Finish time: ____________  °Date: ____________ Movements: ____________ Start time: ____________ Finish time: ____________ °Date: ____________ Movements: ____________ Start time: ____________ Finish  time: ____________ °Date: ____________ Movements: ____________ Start time: ____________ Finish time: ____________ °Date: ____________ Movements: ____________ Start time: ____________ Finish time: ____________ °Date: ____________ Movements: ____________ Start time: ____________ Finish time: ____________ °Date: ____________ Movements: ____________ Start time: ____________ Finish time: ____________ °Date: ____________ Movements: ____________ Start time: ____________ Finish time: ____________  °Date: ____________ Movements: ____________ Start time: ____________ Finish   time: ____________ Date: ____________ Movements: ____________ Start time: ____________ Doreatha Martin time: ____________ Date: ____________ Movements: ____________ Start time: ____________ Doreatha Martin time: ____________ Date: ____________ Movements: ____________ Start time: ____________ Doreatha Martin time: ____________ Date: ____________ Movements: ____________ Start time: ____________ Doreatha Martin time: ____________ Date: ____________ Movements: ____________ Start time: ____________ Doreatha Martin time: ____________ Date: ____________ Movements: ____________ Start time: ____________ Doreatha Martin time: ____________  Date: ____________ Movements: ____________ Start time: ____________ Doreatha Martin time: ____________ Date: ____________ Movements: ____________ Start time: ____________ Doreatha Martin time: ____________ Date: ____________ Movements: ____________ Start time: ____________ Doreatha Martin time: ____________ Date: ____________ Movements: ____________ Start time: ____________ Doreatha Martin time: ____________ Date: ____________ Movements: ____________ Start time: ____________ Doreatha Martin time: ____________ Date: ____________ Movements: ____________ Start time: ____________ Doreatha Martin time: ____________ Date: ____________ Movements: ____________ Start time: ____________ Doreatha Martin time: ____________  Date: ____________ Movements: ____________ Start time: ____________ Doreatha Martin time: ____________ Date: ____________  Movements: ____________ Start time: ____________ Doreatha Martin time: ____________ Date: ____________ Movements: ____________ Start time: ____________ Doreatha Martin time: ____________ Date: ____________ Movements: ____________ Start time: ____________ Doreatha Martin time: ____________ Date: ____________ Movements: ____________ Start time: ____________ Doreatha Martin time: ____________ Date: ____________ Movements: ____________ Start time: ____________ Doreatha Martin time: ____________ Date: ____________ Movements: ____________ Start time: ____________ Doreatha Martin time: ____________  Date: ____________ Movements: ____________ Start time: ____________ Doreatha Martin time: ____________ Date: ____________ Movements: ____________ Start time: ____________ Doreatha Martin time: ____________ Date: ____________ Movements: ____________ Start time: ____________ Doreatha Martin time: ____________ Date: ____________ Movements: ____________ Start time: ____________ Doreatha Martin time: ____________ Date: ____________ Movements: ____________ Start time: ____________ Doreatha Martin time: ____________ Date: ____________ Movements: ____________ Start time: ____________ Doreatha Martin time: ____________ Document Released: 05/11/2006 Document Revised: 08/26/2013 Document Reviewed: 02/06/2012 [employer] Patient Information 2015 [employer]. This information is not intended to replace advice given to you by your health care provider. Make sure you discuss any questions you have with your health care provider. Hypertension During Pregnancy Hypertension, or high blood pressure, is when there is extra pressure inside your blood vessels that carry blood from the heart to the rest of your body (arteries). It can happen at any time in life, including pregnancy. Hypertension during pregnancy can cause problems for you and your baby. Your baby might not weigh as much as he or she should at birth or might be born early (premature). Very bad cases of hypertension during pregnancy can be life-threatening.  Different types  of hypertension can occur during pregnancy. These include:  Chronic hypertension. This happens when a woman has hypertension before pregnancy and it continues during pregnancy.  Gestational hypertension. This is when hypertension develops during pregnancy.  Preeclampsia or toxemia of pregnancy. This is a very serious type of hypertension that develops only during pregnancy. It affects the whole body and can be very dangerous for both mother and baby.  Gestational hypertension and preeclampsia usually go away after your baby is born. Your blood pressure will likely stabilize within 6 weeks. Women who have hypertension during pregnancy have a greater chance of developing hypertension later in life or with future pregnancies. RISK FACTORS There are certain factors that make it more likely for you to develop hypertension during pregnancy. These include: 5. Having hypertension before pregnancy. 6. Having hypertension during a previous pregnancy. 7. Being overweight. 8. Being older than 40 years. 9. Being pregnant with more than one baby. 10. Having diabetes or kidney problems. SIGNS AND SYMPTOMS Chronic and gestational hypertension rarely cause symptoms. Preeclampsia has symptoms, which may include:  Increased protein in your urine. Your health care provider will check for this at every prenatal visit.  Swelling of your  hands and face.  Rapid weight gain.  Headaches.  Visual changes.  Being bothered by light.  Abdominal pain, especially in the upper right area.  Chest pain.  Shortness of breath.  Increased reflexes.  Seizures. These occur with a more severe form of preeclampsia, called eclampsia. DIAGNOSIS  You may be diagnosed with hypertension during a regular prenatal exam. At each prenatal visit, you may have:  Your blood pressure checked.  A urine test to check for protein in your urine. The type of hypertension you are diagnosed with depends on when you developed it.  It also depends on your specific blood pressure reading.  Developing hypertension before 20 weeks of pregnancy is consistent with chronic hypertension.  Developing hypertension after 20 weeks of pregnancy is consistent with gestational hypertension.  Hypertension with increased urinary protein is diagnosed as preeclampsia.  Blood pressure measurements that stay above 160 systolic or 110 diastolic are a sign of severe preeclampsia. TREATMENT Treatment for hypertension during pregnancy varies. Treatment depends on the type of hypertension and how serious it is.  If you take medicine for chronic hypertension, you may need to switch medicines.  Medicines called ACE inhibitors should not be taken during pregnancy.  Low-dose aspirin may be suggested for women who have risk factors for preeclampsia.  If you have gestational hypertension, you may need to take a blood pressure medicine that is safe during pregnancy. Your health care provider will recommend the correct medicine.  If you have severe preeclampsia, you may need to be in the hospital. Health care providers will watch you and your baby very closely. You also may need to take medicine called magnesium sulfate to prevent seizures and lower blood pressure.  Sometimes, an early delivery is needed. This may be the case if the condition worsens. It would be done to protect you and your baby. The only cure for preeclampsia is delivery.  Your health care provider may recommend that you take one low-dose aspirin (81 mg) each day to help prevent high blood pressure during your pregnancy if you are at risk for preeclampsia. You may be at risk for preeclampsia if:  You had preeclampsia or eclampsia during a previous pregnancy.  Your baby did not grow as expected during a previous pregnancy.  You experienced preterm birth with a previous pregnancy.  You experienced a separation of the placenta from the uterus (placental abruption) during a  previous pregnancy.  You experienced the loss of your baby during a previous pregnancy.  You are pregnant with more than one baby.  You have other medical conditions, such as diabetes or an autoimmune disease. HOME CARE INSTRUCTIONS  Schedule and keep all of your regular prenatal care appointments. This is important.  Take medicines only as directed by your health care provider. Tell your health care provider about all medicines you take.  Eat as little salt as possible.  Get regular exercise.  Do not drink alcohol.  Do not use tobacco products.  Do not drink products with caffeine.  Lie on your left side when resting. SEEK IMMEDIATE MEDICAL CARE IF:  You have severe abdominal pain.  You have sudden swelling in your hands, ankles, or face.  You gain 4 pounds (1.8 kg) or more in 1 week.  You vomit repeatedly.  You have vaginal bleeding.  You do not feel your baby moving as much.  You have a headache.  You have blurred or double vision.  You have muscle twitching or spasms.  You have shortness  of breath.  You have blue fingernails or lips.  You have blood in your urine. MAKE SURE YOU:  Understand these instructions.  Will watch your condition.  Will get help right away if you are not doing well or get worse. Document Released: 12/28/2010 Document Revised: 08/26/2013 Document Reviewed: 11/08/2012 [facility] Patient Information 2015 [place] [place]. This information is not intended to replace advice given to you by your health care provider. Make sure you discuss any questions you have with your health care provider.

## 2014-05-26 NOTE — Progress Notes (Signed)
U/S(36+2wks)-vtx active fetus, BPP 8/8, fluid WNL, AFI- 17.4cm SDP-4.7 cm, FHR- 135 bpm, anterior Gr 2 placenta, EFw 6 lb 0 oz (35th%tile), UA Doppler RI-0.56 & 0.52

## 2014-05-26 NOTE — MAU Note (Signed)
Pt presents for BP eval per Dr. Despina HiddenEure. Pt has headache and vision changes with nausea. Denies vaginal bleeding and discharge. Reports good fetal movement.

## 2014-05-29 ENCOUNTER — Encounter: Payer: Self-pay | Admitting: Advanced Practice Midwife

## 2014-05-29 ENCOUNTER — Ambulatory Visit (INDEPENDENT_AMBULATORY_CARE_PROVIDER_SITE_OTHER): Payer: Managed Care, Other (non HMO) | Admitting: Obstetrics & Gynecology

## 2014-05-29 ENCOUNTER — Inpatient Hospital Stay (HOSPITAL_COMMUNITY)
Admission: AD | Admit: 2014-05-29 | Discharge: 2014-06-01 | DRG: 774 | Disposition: A | Discharge status: Home / Self Care | Payer: Managed Care, Other (non HMO) | Source: Intra-hospital | Attending: Family Medicine | Admitting: Family Medicine

## 2014-05-29 ENCOUNTER — Encounter: Payer: Self-pay | Admitting: Obstetrics & Gynecology

## 2014-05-29 ENCOUNTER — Encounter (HOSPITAL_COMMUNITY): Payer: Self-pay

## 2014-05-29 VITALS — BP 158/120 | Wt 161.0 lb

## 2014-05-29 DIAGNOSIS — Z331 Pregnant state, incidental: Secondary | ICD-10-CM

## 2014-05-29 DIAGNOSIS — Z1389 Encounter for screening for other disorder: Secondary | ICD-10-CM

## 2014-05-29 DIAGNOSIS — Z3A36 36 weeks gestation of pregnancy: Secondary | ICD-10-CM | POA: Diagnosis present

## 2014-05-29 DIAGNOSIS — O1410 Severe pre-eclampsia, unspecified trimester: Secondary | ICD-10-CM | POA: Diagnosis present

## 2014-05-29 DIAGNOSIS — Z1159 Encounter for screening for other viral diseases: Secondary | ICD-10-CM

## 2014-05-29 DIAGNOSIS — O1413 Severe pre-eclampsia, third trimester: Secondary | ICD-10-CM

## 2014-05-29 DIAGNOSIS — Z87891 Personal history of nicotine dependence: Secondary | ICD-10-CM

## 2014-05-29 DIAGNOSIS — N200 Calculus of kidney: Secondary | ICD-10-CM

## 2014-05-29 DIAGNOSIS — Z8249 Family history of ischemic heart disease and other diseases of the circulatory system: Secondary | ICD-10-CM | POA: Diagnosis not present

## 2014-05-29 DIAGNOSIS — O10019 Pre-existing essential hypertension complicating pregnancy, unspecified trimester: Secondary | ICD-10-CM

## 2014-05-29 DIAGNOSIS — O119 Pre-existing hypertension with pre-eclampsia, unspecified trimester: Secondary | ICD-10-CM

## 2014-05-29 DIAGNOSIS — O1002 Pre-existing essential hypertension complicating childbirth: Secondary | ICD-10-CM | POA: Diagnosis present

## 2014-05-29 DIAGNOSIS — O0990 Supervision of high risk pregnancy, unspecified, unspecified trimester: Secondary | ICD-10-CM

## 2014-05-29 DIAGNOSIS — Z118 Encounter for screening for other infectious and parasitic diseases: Secondary | ICD-10-CM

## 2014-05-29 DIAGNOSIS — O113 Pre-existing hypertension with pre-eclampsia, third trimester: Principal | ICD-10-CM | POA: Diagnosis present

## 2014-05-29 DIAGNOSIS — Z8759 Personal history of other complications of pregnancy, childbirth and the puerperium: Secondary | ICD-10-CM

## 2014-05-29 DIAGNOSIS — Z87440 Personal history of urinary (tract) infections: Secondary | ICD-10-CM

## 2014-05-29 DIAGNOSIS — O09893 Supervision of other high risk pregnancies, third trimester: Secondary | ICD-10-CM

## 2014-05-29 DIAGNOSIS — Z3685 Encounter for antenatal screening for Streptococcus B: Secondary | ICD-10-CM

## 2014-05-29 LAB — POCT URINALYSIS DIPSTICK
Blood, UA: NEGATIVE
Glucose, UA: NEGATIVE
Ketones, UA: NEGATIVE
LEUKOCYTES UA: NEGATIVE
NITRITE UA: NEGATIVE
PROTEIN UA: NEGATIVE

## 2014-05-29 LAB — GROUP B STREP BY PCR: GROUP B STREP BY PCR: NEGATIVE

## 2014-05-29 LAB — CBC
HCT: 34.7 % — ABNORMAL LOW (ref 36.0–46.0)
Hemoglobin: 12 g/dL (ref 12.0–15.0)
MCH: 31.7 pg (ref 26.0–34.0)
MCHC: 34.6 g/dL (ref 30.0–36.0)
MCV: 91.8 fL (ref 78.0–100.0)
Platelets: 250 10*3/uL (ref 150–400)
RBC: 3.78 MIL/uL — ABNORMAL LOW (ref 3.87–5.11)
RDW: 13.2 % (ref 11.5–15.5)
WBC: 16.1 10*3/uL — ABNORMAL HIGH (ref 4.0–10.5)

## 2014-05-29 LAB — TYPE AND SCREEN
ABO/RH(D): O POS
Antibody Screen: NEGATIVE

## 2014-05-29 LAB — PROTEIN / CREATININE RATIO, URINE
CREATININE, URINE: 92 mg/dL
Protein Creatinine Ratio: 0.22 — ABNORMAL HIGH (ref 0.00–0.15)
Total Protein, Urine: 20 mg/dL

## 2014-05-29 MED ORDER — LIDOCAINE HCL (PF) 1 % IJ SOLN: 30.0000 mL | INTRAMUSCULAR | Status: DC | PRN

## 2014-05-29 MED ORDER — TERBUTALINE SULFATE 1 MG/ML IJ SOLN: 0.2500 mg | Freq: Once | INTRAMUSCULAR | Status: AC | PRN

## 2014-05-29 MED ORDER — OXYTOCIN 40 UNITS IN LACTATED RINGERS INFUSION - SIMPLE MED: 62.5000 mL/h | INTRAVENOUS | Status: DC

## 2014-05-29 MED ORDER — FLEET ENEMA 7-19 GM/118ML RE ENEM: 1.0000 | ENEMA | RECTAL | Status: DC | PRN

## 2014-05-29 MED ORDER — LACTATED RINGERS IV SOLN
INTRAVENOUS | Status: DC
  Administered 2014-05-29 – 2014-05-30 (×2): via INTRAVENOUS

## 2014-05-29 MED ORDER — CITRIC ACID-SODIUM CITRATE 334-500 MG/5ML PO SOLN: 30.0000 mL | ORAL | Status: DC | PRN

## 2014-05-29 MED ORDER — OXYTOCIN 40 UNITS IN LACTATED RINGERS INFUSION - SIMPLE MED
62.5000 mL/h | INTRAVENOUS | Status: DC
  Administered 2014-05-30: 62.5 mL/h via INTRAVENOUS

## 2014-05-29 MED ORDER — ONDANSETRON HCL 4 MG/2ML IJ SOLN
4.0000 mg | Freq: Four times a day (QID) | INTRAMUSCULAR | Status: DC | PRN
  Administered 2014-05-30: 4 mg via INTRAVENOUS
  Filled 2014-05-29: qty 2

## 2014-05-29 MED ORDER — MISOPROSTOL 50MCG HALF TABLET
50.0000 ug | ORAL_TABLET | ORAL | Status: DC
  Administered 2014-05-29 – 2014-05-30 (×3): 50 ug via ORAL
  Filled 2014-05-29 (×11): qty 1

## 2014-05-29 MED ORDER — LIDOCAINE HCL (PF) 1 % IJ SOLN
30.0000 mL | INTRAMUSCULAR | Status: DC | PRN
  Filled 2014-05-29: qty 30

## 2014-05-29 MED ORDER — OXYTOCIN BOLUS FROM INFUSION: 500.0000 mL | INTRAVENOUS | Status: DC

## 2014-05-29 MED ORDER — MAGNESIUM SULFATE 40 G IN LACTATED RINGERS - SIMPLE
2.0000 g/h | INTRAVENOUS | Status: DC
  Administered 2014-05-29 – 2014-05-30 (×3): 2 g/h via INTRAVENOUS
  Filled 2014-05-29 (×2): qty 500

## 2014-05-29 MED ORDER — MAGNESIUM SULFATE BOLUS VIA INFUSION
4.0000 g | Freq: Once | INTRAVENOUS | Status: AC
  Administered 2014-05-29: 4 g via INTRAVENOUS
  Filled 2014-05-29: qty 500

## 2014-05-29 MED ORDER — ONDANSETRON HCL 4 MG/2ML IJ SOLN: 4.0000 mg | Freq: Four times a day (QID) | INTRAMUSCULAR | Status: DC | PRN

## 2014-05-29 MED ORDER — OXYTOCIN 40 UNITS IN LACTATED RINGERS INFUSION - SIMPLE MED: 1.0000 m[IU]/min | INTRAVENOUS | Status: DC

## 2014-05-29 MED ORDER — LACTATED RINGERS IV SOLN
500.0000 mL | INTRAVENOUS | Status: DC | PRN
  Administered 2014-05-31: 500 mL via INTRAVENOUS

## 2014-05-29 MED ORDER — LABETALOL HCL 300 MG PO TABS
300.0000 mg | ORAL_TABLET | Freq: Two times a day (BID) | ORAL | Status: DC
  Filled 2014-05-29 (×3): qty 1

## 2014-05-29 MED ORDER — OXYTOCIN BOLUS FROM INFUSION
500.0000 mL | INTRAVENOUS | Status: DC
  Administered 2014-05-30: 500 mL via INTRAVENOUS

## 2014-05-29 MED ORDER — FENTANYL CITRATE 0.05 MG/ML IJ SOLN: 50.0000 ug | INTRAMUSCULAR | Status: DC | PRN

## 2014-05-29 MED ORDER — FLEET ENEMA 7-19 GM/118ML RE ENEM: 1.0000 | ENEMA | Freq: Every day | RECTAL | Status: DC | PRN

## 2014-05-29 MED ORDER — CITRIC ACID-SODIUM CITRATE 334-500 MG/5ML PO SOLN
30.0000 mL | ORAL | Status: DC | PRN
  Filled 2014-05-29: qty 30

## 2014-05-29 MED ORDER — ZOLPIDEM TARTRATE 5 MG PO TABS
5.0000 mg | ORAL_TABLET | Freq: Every evening | ORAL | Status: DC | PRN
  Administered 2014-05-30: 5 mg via ORAL
  Filled 2014-05-29: qty 1

## 2014-05-29 MED ORDER — LACTATED RINGERS IV SOLN: 500.0000 mL | INTRAVENOUS | Status: DC | PRN

## 2014-05-29 MED ORDER — ACETAMINOPHEN 325 MG PO TABS
650.0000 mg | ORAL_TABLET | ORAL | Status: DC | PRN
  Administered 2014-05-30 (×2): 650 mg via ORAL
  Filled 2014-05-29 (×2): qty 2

## 2014-05-29 MED ORDER — ACETAMINOPHEN 325 MG PO TABS: 650.0000 mg | ORAL_TABLET | ORAL | Status: DC | PRN

## 2014-05-29 MED ORDER — LACTATED RINGERS IV SOLN: INTRAVENOUS | Status: DC

## 2014-05-29 NOTE — H&P (Signed)
Favour Marden NobleKemp is a 29 y.o. female G3P2002 with IUP at 8544w5d presenting for IOL for CHTN with superimposed preeclampsia with severe features (HA, vision changes). She has been in twice weekly testing since 32 weeks, all has been reassuring.  Monday she was worked up for preeclampsia d/t HA, all labs were negative.  Today, her b/p was 158/120 with HA, negative urine on dip. HA is now gone and bp improved (took 400mg  Labetalol at 1800).  [facility]  [facility]  Prenatal History/Complications:  SVD at term X2 without problems   Past Medical History: Past Medical History  Diagnosis Date  . Hypertension   . History of recurrent UTIs     Past Surgical History: Past Surgical History  Procedure Laterality Date  . No past surgeries      Obstetrical History: OB History    Gravida Para Term Preterm AB TAB SAB Ectopic Multiple Living   3 2 2       2        Social History: History   Social History  . Marital Status: Married    Spouse Name: N/A    Number of Children: N/A  . Years of Education: N/A   Social History Main Topics  . Smoking status: Former Smoker    Types: Cigarettes    Quit date: 03/25/2014  . Smokeless tobacco: Never Used  . Alcohol Use: No  . Drug Use: No  . Sexual Activity: Yes    Birth Control/ Protection: None   Other Topics Concern  . Not on file   Social History Narrative    Family History: Family History  Problem Relation Age of Onset  . Heart disease Paternal Grandmother   . Cancer Paternal Grandmother     breast  . Thyroid disease Father   . Cancer Father     lung  . Hodgkin's lymphoma Father   . Kidney disease Mother     GN3N  . Other Maternal Grandmother     Guillian Barre syndrome    Allergies: Allergies  Allergen Reactions  . Codeine Hives and Shortness Of Breath    143/83  Prenatal Transfer Tool  Maternal Diabetes: No Genetic Screening: Normal Maternal Ultrasounds/Referrals: Normal Fetal Ultrasounds  or other Referrals:  None Maternal Substance Abuse:  No Significant Maternal Medications:  Meds include: Other: labetalol 200mg  BID; ASA 81mg  qd Significant Maternal Lab Results: None     Review of Systems   Constitutional: Negative for fever and chills Eyes: Negative for visual disturbances Respiratory: Negative for shortness of breath, dyspnea Cardiovascular: Negative for chest pain or palpitations  Gastrointestinal: Negative for vomiting, diarrhea and constipation.  No RUQ pain Genitourinary: Negative for dysuria and urgency Musculoskeletal: Negative for back pain, joint pain, myalgias  Neurological: POSITIVE for dizziness and headaches for several days, NONE NOW      Last menstrual period 09/14/2013. General appearance: alert, cooperative and no distress Lungs: clear to auscultation bilaterally Heart: regular rate and rhythm Abdomen: soft, non-tender; bowel sounds normal Pelvic: 1-2/20/-2 Extremities: Homans sign is negative, no sign of DVT DTR's 4+, no clonus Presentation: cephalic Fetal monitoring  Baseline: 145 bpm, Variability: Good {> 6 bpm), Accelerations: Reactive and Decelerations: Absent Uterine activity  none      Prenatal labs: ABO, Rh: O/POS/-- (06/23 1145) Antibody: NEG (11/30 0955) Rubella:  immune RPR: NON REAC (11/30 0955)  HBsAg: NEGATIVE (06/23 1145)  HIV: NONREACTIVE (11/30 0955)  GBS:   pending 2 hr Glucola 80/112/98 Genetic  screening  Normal NT/IT Anatomy US normal female.  EFW 05/26/14 35%   Assessment: Cheyene Mayers is a 29 y.o. [id number] with an IUP at [redacted]w[redacted]d presenting for IOL for CHTN with superimposed preeclampsia  Plan: #Labor: Foley/cytotec->pitocin.  MgSO4 for seizure prophylaxis #Pain:  epidural #FWB Cat 1. #ID: GBS: pending  #MOF:  breast #MOC: nexplanon #Circ: na   CRESENZO-DISHMAN,Goldye Tourangeau 05/29/2014, 8:29 PM

## 2014-05-29 NOTE — Progress Notes (Signed)
Reactive NST  BP noted GBS sent today   High Risk Pregnancy Diagnosis(es):   Chronic Hypertension  G3P2002 3668w5d Estimated Date of Delivery: 06/21/14  Blood pressure 158/120, weight 161 lb (73.029 kg), last menstrual period 09/14/2013.  Urinalysis: Negative   HPI: Pt continues to have bifrontal radiating headache and 5-10 minute episodes of visual disturbances like she was having before, flashing colored lights and bugs in front of her eyes She also had an episode of near syncope 2 days ago.  Her workup Monday night was normal BP dramatically up today, instructed pt to take 400 labetalol now  Talked with Dr Adrian BlackwaterStinson and he agrees with assessment that this has to be treated as superimposed pre eclampsia with CNS symptoms and as such cervical ripening and induction is indicated.  [facility]      BP weight and urine results all reviewed and noted. Patient reports good fetal movement, denies any bleeding and no rupture of membranes symptoms or regular contractions.  Fundal Height:  36 Fetal Heart rate:  140 Edema:  trace  Patient is without complaints. All questions were answered.  Assessment:  4968w5d,   Chronic hypertension with superimposed pre eclampsia, with severe feature of CNS symptoms  Medication(s) Plans:  Take 400 labetalol now  Treatment Plan:  To L&D for induction  Follow up in end of next week for BP check

## 2014-05-29 NOTE — H&P (Signed)
H&P    Expand All Collapse All    Bianca [name] is a 29 y.o. female G3P2002 with IUP at 598w5d presenting for IOL for CHTN with superimposed preeclampsia with severe features (HA, vision changes). She has been in twice weekly testing since 32 weeks, all has been reassuring. Monday she was worked up for preeclampsia d/t HA, all labs were negative. Today, her b/p was 158/120 with HA, negative urine on dip. HA is now gone and bp improved (took 400mg  Labetalol at 1800). [facility]  Prenatal History/Complications:  SVD at term X2 without problems   Past Medical History: Past Medical History  Diagnosis Date  . Hypertension   . History of recurrent UTIs     Past Surgical History: Past Surgical History  Procedure Laterality Date  . No past surgeries      Obstetrical History: OB History    Gravida Para Term Preterm AB TAB SAB Ectopic Multiple Living   3 2 2       2        Social History: History   Social History  . Marital Status: Married    Spouse Name: N/A    Number of Children: N/A  . Years of Education: N/A   Social History Main Topics  . Smoking status: Former Smoker    Types: Cigarettes    Quit date: 03/25/2014  . Smokeless tobacco: Never Used  . Alcohol Use: No  . Drug Use: No  . Sexual Activity: Yes    Birth Control/ Protection: None   Other Topics Concern  . Not on file   Social History Narrative    Family History: Family History  Problem Relation Age of Onset  . Heart disease Paternal Grandmother   . Cancer Paternal Grandmother     breast  . Thyroid disease Father   . Cancer Father     lung  . Hodgkin's lymphoma Father   . Kidney disease Mother     GN3N  . Other Maternal Grandmother     Guillian Barre syndrome    Allergies: Allergies  Allergen Reactions  .  Codeine Hives and Shortness Of Breath    143/83  Prenatal Transfer Tool  Maternal Diabetes: No Genetic Screening: Normal Maternal Ultrasounds/Referrals: Normal Fetal Ultrasounds or other Referrals: None Maternal Substance Abuse: No Significant Maternal Medications: Meds include: Other: labetalol 200mg  BID; ASA 81mg  qd Significant Maternal Lab Results: None     Review of Systems   Constitutional: Negative for fever and chills Eyes: Negative for visual disturbances Respiratory: Negative for shortness of breath, dyspnea Cardiovascular: Negative for chest pain or palpitations  Gastrointestinal: Negative for vomiting, diarrhea and constipation. No RUQ pain Genitourinary: Negative for dysuria and urgency Musculoskeletal: Negative for back pain, joint pain, myalgias  Neurological: POSITIVE for dizziness and headaches for several days, NONE NOW      Last menstrual period 09/14/2013. General appearance: alert, cooperative and no distress Lungs: clear to auscultation bilaterally Heart: regular rate and rhythm Abdomen: soft, non-tender; bowel sounds normal Pelvic: 1-2/20/-2 Extremities: Homans sign is negative, no sign of DVT DTR's 4+, no clonus Presentation: cephalic Fetal monitoring Baseline: 145 bpm, Variability: Good {> 6 bpm), Accelerations: Reactive and Decelerations: Absent Uterine activity none      Prenatal labs: ABO, Rh: O/POS/-- (06/23 1145) Antibody: NEG (11/30 0955) Rubella:  immune RPR: NON REAC (11/30 0955)  HBsAg: NEGATIVE (06/23 1145)  HIV: NONREACTIVE (11/30 0955)  GBS:   pending 2 hr Glucola 80/112/98  Genetic screening Normal NT/IT Anatomy US normal female. EFW 05/26/14 35%   Assessment: Bianca [name] is a 29 y.o. [id number] with an IUP at [redacted]w[redacted]d presenting for IOL for CHTN with superimposed preeclampsia  Plan: #Labor: Foley/cytotec->pitocin. MgSO4 for seizure prophylaxis #Pain: epidural #FWB Cat 1. #ID: GBS: pending  #MOF:  breast #MOC: nexplanon #Circ: na   CRESENZO-DISHMAN,Cortez Steelman 05/29/2014, 8:29 PM

## 2014-05-30 ENCOUNTER — Inpatient Hospital Stay (HOSPITAL_COMMUNITY): Payer: Managed Care, Other (non HMO) | Admitting: Anesthesiology

## 2014-05-30 ENCOUNTER — Encounter (HOSPITAL_COMMUNITY): Payer: Self-pay | Admitting: Anesthesiology

## 2014-05-30 DIAGNOSIS — O113 Pre-existing hypertension with pre-eclampsia, third trimester: Secondary | ICD-10-CM

## 2014-05-30 DIAGNOSIS — O1002 Pre-existing essential hypertension complicating childbirth: Secondary | ICD-10-CM

## 2014-05-30 DIAGNOSIS — Z87440 Personal history of urinary (tract) infections: Secondary | ICD-10-CM

## 2014-05-30 LAB — CBC
HEMATOCRIT: 32.7 % — AB (ref 36.0–46.0)
HEMOGLOBIN: 11.4 g/dL — AB (ref 12.0–15.0)
MCH: 31.9 pg (ref 26.0–34.0)
MCHC: 34.9 g/dL (ref 30.0–36.0)
MCV: 91.6 fL (ref 78.0–100.0)
Platelets: 223 10*3/uL (ref 150–400)
RBC: 3.57 MIL/uL — AB (ref 3.87–5.11)
RDW: 13.2 % (ref 11.5–15.5)
WBC: 14.8 10*3/uL — AB (ref 4.0–10.5)

## 2014-05-30 LAB — OB RESULTS CONSOLE GBS: STREP GROUP B AG: NEGATIVE

## 2014-05-30 LAB — ABO/RH: ABO/RH(D): O POS

## 2014-05-30 MED ORDER — EPHEDRINE 5 MG/ML INJ
10.0000 mg | INTRAVENOUS | Status: DC | PRN
  Filled 2014-05-30: qty 2

## 2014-05-30 MED ORDER — LACTATED RINGERS IV SOLN: 500.0000 mL | Freq: Once | INTRAVENOUS | Status: DC

## 2014-05-30 MED ORDER — DIPHENHYDRAMINE HCL 50 MG/ML IJ SOLN: 12.5000 mg | INTRAMUSCULAR | Status: DC | PRN

## 2014-05-30 MED ORDER — PHENYLEPHRINE 40 MCG/ML (10ML) SYRINGE FOR IV PUSH (FOR BLOOD PRESSURE SUPPORT)
80.0000 ug | PREFILLED_SYRINGE | INTRAVENOUS | Status: DC | PRN
  Filled 2014-05-30: qty 2
  Filled 2014-05-30: qty 20

## 2014-05-30 MED ORDER — PHENYLEPHRINE 40 MCG/ML (10ML) SYRINGE FOR IV PUSH (FOR BLOOD PRESSURE SUPPORT)
80.0000 ug | PREFILLED_SYRINGE | INTRAVENOUS | Status: DC | PRN
  Filled 2014-05-30: qty 2

## 2014-05-30 MED ORDER — OXYTOCIN 40 UNITS IN LACTATED RINGERS INFUSION - SIMPLE MED
1.0000 m[IU]/min | INTRAVENOUS | Status: DC
  Administered 2014-05-30: 2 m[IU]/min via INTRAVENOUS
  Filled 2014-05-30: qty 1000

## 2014-05-30 MED ORDER — FENTANYL 2.5 MCG/ML BUPIVACAINE 1/10 % EPIDURAL INFUSION (WH - ANES)
14.0000 mL/h | INTRAMUSCULAR | Status: DC | PRN
  Administered 2014-05-30 (×2): 14 mL/h via EPIDURAL
  Filled 2014-05-30 (×2): qty 125

## 2014-05-30 MED ORDER — CALCIUM CARBONATE ANTACID 500 MG PO CHEW
400.0000 mg | CHEWABLE_TABLET | Freq: Three times a day (TID) | ORAL | Status: DC | PRN
  Filled 2014-05-30: qty 1

## 2014-05-30 MED ORDER — FENTANYL 2.5 MCG/ML BUPIVACAINE 1/10 % EPIDURAL INFUSION (WH - ANES)
INTRAMUSCULAR | Status: DC | PRN
  Administered 2014-05-30: 12.5 mL/h via EPIDURAL

## 2014-05-30 MED ORDER — LIDOCAINE HCL (PF) 1 % IJ SOLN
INTRAMUSCULAR | Status: DC | PRN
  Administered 2014-05-30 (×2): 4 mL

## 2014-05-30 MED ORDER — IBUPROFEN 600 MG PO TABS
600.0000 mg | ORAL_TABLET | Freq: Four times a day (QID) | ORAL | Status: DC
  Administered 2014-05-31 – 2014-06-01 (×7): 600 mg via ORAL
  Filled 2014-05-30 (×8): qty 1

## 2014-05-30 MED ORDER — FENTANYL CITRATE 0.05 MG/ML IJ SOLN: 50.0000 ug | INTRAMUSCULAR | Status: DC | PRN

## 2014-05-30 NOTE — Progress Notes (Signed)
Patient ID: Bianca [name], female   DOB: 1985/07/30, 29 y.o.   MRN: [id number] Doing well Pitocin on 14 mu/min  FHR stable UCs spacing out, every 4-5 min  Filed Vitals:   05/30/14 1401 05/30/14 1402 05/30/14 1431 05/30/14 1459  BP: 124/84  116/81   Pulse: 80  80   Temp:    97.4 F (36.3 C)  TempSrc:    Oral  Resp:  16    Height:      Weight:      SpO2:       MgSO4 running with no signs of toxicity  Plan to increase Pitocin and await active labor

## 2014-05-30 NOTE — Progress Notes (Signed)
Patient ID: Bianca [name], female   DOB: [birth year], 29 y.o.   MRN: [id number]  Pt has no complaints at this time. Foley cath placed. Cervix: 4/50/-3 ballottable. Will increase pitocin.

## 2014-05-30 NOTE — Progress Notes (Signed)
Patient ID: Bianca [name], female   DOB: 07/30/1985, 29 y.o.   MRN: [id number] Doing well but frustrated  Filed Vitals:   05/30/14 1801 05/30/14 1831 05/30/14 1901 05/30/14 1907  BP: 130/67 141/92 130/85   Pulse: 84 92 83   Temp:      TempSrc:      Resp:    16  Height:      Weight:      SpO2:       FHR stable and reassuring UCs every 2-4 minutes  Dilation: 4 Effacement (%): 80 Cervical Position: Middle Station: -2 Presentation: Vertex Exam by:: Hanalei Glace,CNM  Amniotomy performed for copious clear fluid  Anticipate increased labor now

## 2014-05-30 NOTE — Progress Notes (Signed)
Bianca [name] is a 29 y.o. G3P2002 at 2411w6d admitted for [facility].  Subjective: Pt comfortable with epidural.    Objective: BP 116/59 mmHg  Pulse 75  Temp(Src) 98.3 F (36.8 C) (Oral)  Resp 18  Ht 5\' 2"  (1.575 m)  Wt 73.029 kg (161 lb)  BMI 29.44 kg/m2  SpO2 98%  LMP 09/14/2013 I/O last 3 completed shifts: In: 4898.3 [P.O.:3040; I.V.:1858.3] Out: 4255 [Urine:4155; Emesis/NG output:100] Total I/O In: 630 [P.O.:480; I.V.:150] Out: 225 [Urine:225]  FHT:  FHR: 145 bpm, variability: moderate,  accelerations:  Present,  decelerations:  Absent UC:   regular, every 2-3 minutes SVE:   Dilation: 5 Effacement (%): 80 Station: -2 Exam by:: Misty StanleyLisa Leftwich-Kirby CNM   IUPC placed without difficulty, pt tolerated well.  Some blood return into catheter, flushed with NS, clear fluid noted in catheter after flush  Labs: Lab Results  Component Value Date   WBC 14.8* 05/30/2014   HGB 11.4* 05/30/2014   HCT 32.7* 05/30/2014   MCV 91.6 05/30/2014   PLT 223 05/30/2014    Assessment / Plan: [facility] and superimposed preecl[facility],  progressing well on pitocin  Labor: Progressing normally Preeclampsia:  on magnesium sulfate Fetal Wellbeing:  Category I Pain Control:  Epidural I/D:  n/a Anticipated MOD:  NSVD  LEFTWICH-KIRBY, Bianca [name] 05/30/2014, 9:16 PM

## 2014-05-30 NOTE — Anesthesia Procedure Notes (Signed)
Epidural Patient location during procedure: OB Start time: 05/30/2014 8:24 AM  Staffing Anesthesiologist: Malen GauzeFOSTER, Nandita Mathenia A. Performed by: anesthesiologist   Preanesthetic Checklist Completed: patient identified, site marked, surgical consent, pre-op evaluation, timeout performed, IV checked, risks and benefits discussed and monitors and equipment checked  Epidural Patient position: sitting Prep: site prepped and draped and DuraPrep Patient monitoring: continuous pulse ox and blood pressure Approach: midline Location: L3-L4 Injection technique: LOR air  Needle:  Needle type: Tuohy  Needle gauge: 17 G Needle length: 9 cm and 9 Needle insertion depth: 5 cm cm Catheter type: closed end flexible Catheter size: 19 Gauge Catheter at skin depth: 10 cm Test dose: negative and Other  Assessment Events: blood not aspirated, injection not painful, no injection resistance, negative IV test and no paresthesia  Additional Notes Patient identified. Risks and benefits discussed including failed block, incomplete  Pain control, post dural puncture headache, nerve damage, paralysis, blood pressure Changes, nausea, vomiting, reactions to medications-both toxic and allergic and post Partum back pain. All questions were answered. Patient expressed understanding and wished to proceed. Sterile technique was used throughout procedure. Epidural site was Dressed with sterile barrier dressing. No paresthesias, signs of intravascular injection Or signs of intrathecal spread were encountered.  Patient was more comfortable after the epidural was dosed. Please see RN's note for documentation of vital signs and FHR which are stable.

## 2014-05-30 NOTE — Progress Notes (Signed)
Bianca [name] is a 29 y.o. G3P2002 at 3738w6d admitted [facility]  Subjective: Feels mild cramping, but able to rest.  Denies HA at this time.  Objective: BP 146/75 mmHg  Pulse 92  Temp(Src) 98 F (36.7 C) (Oral)  Resp 18  Ht 5\' 2"  (1.575 m)  Wt 73.029 kg (161 lb)  BMI 29.44 kg/m2  LMP 09/14/2013 Total I/O In: 1293.3 [P.O.:960; I.V.:333.3] Out: 375 [Urine:375]  FHT:  FHR: 135 bpm, variability: moderate,  accelerations:  Present,  decelerations:  Absent UC:   Regular/mild, every 4-5 minutes  SVE:   Deferred, foley bulb still in place.  Labs: Lab Results  Component Value Date   WBC 16.1* 05/29/2014   HGB 12.0 05/29/2014   HCT 34.7* 05/29/2014   MCV 91.8 05/29/2014   PLT 250 05/29/2014    Assessment: IOL for CHTN with superimposed preeclampsia, progressing normally with cytotec & foley bulb. Category 2 tracing. Pre-eclampsia:  Labs & BP stable, no headache, good output GBS negative  Plan: Continue foley bulb/cytotec->pitocin/arom Continue MgSo4 &labetalol Pain meds prn Anticipate NSVD   [name] SNM 05/30/2014, 12:29 AM

## 2014-05-30 NOTE — Anesthesia Preprocedure Evaluation (Signed)
Anesthesia Evaluation  Patient identified by MRN, date of birth, ID band Patient awake    Reviewed: Allergy & Precautions, NPO status , Patient's Chart, lab work & pertinent test results  Airway Mallampati: II  TM Distance: >3 FB Neck ROM: Full    Dental no notable dental hx. (+) Teeth Intact   Pulmonary former smoker,  breath sounds clear to auscultation  Pulmonary exam normal       Cardiovascular hypertension, Pt. on medications and Pt. on home beta blockers Rhythm:Regular Rate:Normal     Neuro/Psych negative neurological ROS  negative psych ROS   GI/Hepatic negative GI ROS, Neg liver ROS,   Endo/Other  negative endocrine ROS  Renal/GU Renal diseaseRecurrent UTI  negative genitourinary   Musculoskeletal negative musculoskeletal ROS (+)   Abdominal   Peds  Hematology  (+) anemia ,   Anesthesia Other Findings   Reproductive/Obstetrics (+) Pregnancy 36 6/7 weeks Pre eclampsia                              Anesthesia Physical Anesthesia Plan  ASA: II  Anesthesia Plan: Epidural   Post-op Pain Management:    Induction:   Airway Management Planned: Natural Airway  Additional Equipment:   Intra-op Plan:   Post-operative Plan:   Informed Consent: I have reviewed the patients History and Physical, chart, labs and discussed the procedure including the risks, benefits and alternatives for the proposed anesthesia with the patient or authorized representative who has indicated his/her understanding and acceptance.     Plan Discussed with: Anesthesiologist  Anesthesia Plan Comments:         Anesthesia Quick Evaluation

## 2014-05-30 NOTE — Progress Notes (Signed)
Amiayah Marden NobleKemp is a 29 y.o. G3P2002 at 6772w6d admitted [facility] with [facility].  Subjective: Reports foley bulb fell out at 0605. Feeling more discomfort with contractions, declines pain medication at this time.  Objective: BP 123/84 mmHg  Pulse 84  Temp(Src) 97.2 F (36.2 C) (Axillary)  Resp 18  Ht 5\' 2"  (1.575 m)  Wt 73.029 kg (161 lb)  BMI 29.44 kg/m2  LMP 09/14/2013 Total I/O In: 3278.3 [P.O.:2370; I.V.:908.3] Out: 2200 [Urine:2200]  FHT:  FHR: 125 bpm, variability: moderate,  accelerations:  Present,  decelerations:  Absent UC:   Every 4-5 minutes  SVE:  Deferred  Pitocin to be started at 0800  Labs: Lab Results  Component Value Date   WBC 16.1* 05/29/2014   HGB 12.0 05/29/2014   HCT 34.7* 05/29/2014   MCV 91.8 05/29/2014   PLT 250 05/29/2014    Assessment / Plan: IOL progressing well  Labor: Early labor, s/p foley bulb; will start pitocin at 0800 Fetal Wellbeing:  Category I Pain Control:  Planning epidural later I/D:  GBS negative Anticipated MOD:  NSVD  Tawsha Terrero Wynne SNM 05/30/2014, 6:25 AM

## 2014-05-30 NOTE — Progress Notes (Signed)
   Bianca [name] is a 29 y.o. G3P2002 at 3061w6d  admitted for induction of labor due to Pre-eclamptic toxemia of pregnancy..  Subjective:  sleeping Objective: Filed Vitals:   05/30/14 0100 05/30/14 0200 05/30/14 0300 05/30/14 0400  BP: 126/70 127/79 130/76 138/72  Pulse: 90 75 79 74  Temp:    97.2 F (36.2 C)  TempSrc:    Axillary  Resp: 20 20  18   Height:      Weight:       Total I/O In: 2938.3 [P.O.:2130; I.V.:808.3] Out: 1850 [Urine:1850]  FHT:  FHR: 130 bpm, variability: moderate,  accelerations:  Present,  decelerations:  Absent UC:   irregular, every 3-6 minutes SVE:  Foley still in   Labs: Lab Results  Component Value Date   WBC 16.1* 05/29/2014   HGB 12.0 05/29/2014   HCT 34.7* 05/29/2014   MCV 91.8 05/29/2014   PLT 250 05/29/2014    Assessment / Plan: IOL for preeclampsia with severe features, ripening phase Just got 3rd cytotec. Will start pit in 4 hours Labor: no Fetal Wellbeing:  Category I Pain Control:  Labor support without medications Anticipated MOD:  NSVD  CRESENZO-DISHMAN,Bianca [name] 05/30/2014, 4:26 AM

## 2014-05-30 NOTE — Progress Notes (Signed)
Bianca [name] is a 29 y.o. G3P2002 at 7554w6d admitted [facility].  Subjective: Pt feeling nauseous and c/o of new headache. Tylenol relieved headache earlier today. No other complaints.  Objective: BP 141/92 mmHg  Pulse 92  Temp(Src) 97.4 F (36.3 C) (Oral)  Resp 16  Ht 5\' 2"  (1.575 m)  Wt 73.029 kg (161 lb)  BMI 29.44 kg/m2  SpO2 98%  LMP 09/14/2013 I/O last 3 completed shifts: In: 3598.3 [P.O.:2490; I.V.:1108.3] Out: 2600 [Urine:2600] Total I/O In: 1225 [P.O.:550; I.V.:675] Out: 1455 [Urine:1455]  FHT:  FHR: 125 bpm, variability: moderate,  accelerations:  Present,  decelerations:  Absent UC:   irregular, every 1-5 minutes SVE:   Dilation: 4 Effacement (%): 50 Station: -2 Exam by:: Julie-Anne Torain/lee  Labs: Lab Results  Component Value Date   WBC 14.8* 05/30/2014   HGB 11.4* 05/30/2014   HCT 32.7* 05/30/2014   MCV 91.6 05/30/2014   PLT 223 05/30/2014    Assessment / Plan: IOl for CHTN with superimposed preeclampsia  Labor: little cervical change. pit @ 22. consider AROM. Preeclampsia:  on magnesium sulfate and headache, relieved by tylenol Fetal Wellbeing:  Category I Pain Control:  Epidural I/D:  gbs neg Anticipated MOD:  NSVD  Rolm BookbinderMoss, Merel Santoli 05/30/2014, 6:47 PM

## 2014-05-30 NOTE — Progress Notes (Addendum)
Bianca [name] is a 29 y.o. G3P2002 at 6363w6d by ultrasound admitted for induction of labor due to Hypertension/preeclampsia.  Subjective: Comfortable with contractions (epidural) but has a headache.  Objective: BP 130/83 mmHg  Pulse 87  Temp(Src) 97.2 F (36.2 C) (Axillary)  Resp 16  Ht 5\' 2"  (1.575 m)  Wt 161 lb (73.029 kg)  BMI 29.44 kg/m2  SpO2 99%  LMP 09/14/2013 I/O last 3 completed shifts: In: 3598.3 [P.O.:2490; I.V.:1108.3] Out: 2600 [Urine:2600] Total I/O In: 75 [I.V.:75] Out: 400 [Urine:400]  FHT:  FHR: 135 bpm, variability: moderate,  accelerations:  Present,  decelerations:  Absent UC:   irregular, every 2-4 minutes SVE:   Dilation: 1.5 Effacement (%): 20 Exam by:: lee  Labs: Lab Results  Component Value Date   WBC 14.8* 05/30/2014   HGB 11.4* 05/30/2014   HCT 32.7* 05/30/2014   MCV 91.6 05/30/2014   PLT 223 05/30/2014    Assessment / Plan: Induction of labor due to preeclampsia,  progressing well on pitocin  Headache, will give a dose of Fentanyl  Labor: Progressing normally, Pitocin started Preeclampsia:  on magnesium sulfate and no signs or symptoms of toxicity Fetal Wellbeing:  Category I Pain Control:  Epidural I/D:  n/a Anticipated MOD:  NSVD  Hortencia Martire 05/30/2014, 9:13 AM

## 2014-05-30 NOTE — Progress Notes (Signed)
Bianca [name] is a 29 y.o. G3P2002 at 3228w6d admitted [facility] of [facility] pre[facility] with severe features (headache, vision changes).  Subjective: Epidural placed. No complaints.  Objective: BP 122/87 mmHg  Pulse 96  Temp(Src) 97.2 F (36.2 C) (Axillary)  Resp 16  Ht 5\' 2"  (1.575 m)  Wt 73.029 kg (161 lb)  BMI 29.44 kg/m2  SpO2 100%  LMP 09/14/2013 I/O last 3 completed shifts: In: 3598.3 [P.O.:2490; I.V.:1108.3] Out: 2600 [Urine:2600] Total I/O In: 75 [I.V.:75] Out: 400 [Urine:400]  FHT:  FHR: 130 bpm, variability: moderate,  accelerations:  Present,  decelerations:  Absent UC:   irregular, every 2-3 minutes SVE:   Dilation: 1.5 Effacement (%): 20 Exam by:: lee  Labs: Lab Results  Component Value Date   WBC 14.8* 05/30/2014   HGB 11.4* 05/30/2014   HCT 32.7* 05/30/2014   MCV 91.6 05/30/2014   PLT 223 05/30/2014    Assessment / Plan: IOL for CHTN with superimposed preeclampsia with severe features.  Labor: foley bulb out @ [id number]. pit started. currently on 4 pitocin. Preeclampsia:  on magnesium sulfate Fetal Wellbeing:  Category I Pain Control:  Epidural I/D:  gbs neg Anticipated MOD:  NSVD  Bianca [name] 05/30/2014, 8:40 AM

## 2014-05-30 NOTE — Progress Notes (Signed)
Bianca [name] is a 29 y.o. G3P2002 at 3877w6d admitted for induction of labor dMarden Nobleue [facility].  Subjective: Pt is feeling tired and hungry. No other complaints. Pit @ 18.  Objective: BP 121/76 mmHg  Pulse 72  Temp(Src) 97.4 F (36.3 C) (Oral)  Resp 16  Ht 5\' 2"  (1.575 m)  Wt 73.029 kg (161 lb)  BMI 29.44 kg/m2  SpO2 98%  LMP 09/14/2013 I/O last 3 completed shifts: In: 3598.3 [P.O.:2490; I.V.:1108.3] Out: 2600 [Urine:2600] Total I/O In: 850 [P.O.:400; I.V.:450] Out: 1175 [Urine:1175]  FHT:  FHR: 120 bpm, variability: moderate,  accelerations:  Abscent,  decelerations:  Absent UC:   irregular, every 3-4 minutes SVE:   Dilation: 4 Effacement (%): 50 Station: -2 Exam by:: Dr. Rachelle HoraMoss and LEE  Labs: Lab Results  Component Value Date   WBC 14.8* 05/30/2014   HGB 11.4* 05/30/2014   HCT 32.7* 05/30/2014   MCV 91.6 05/30/2014   PLT 223 05/30/2014    Assessment / Plan: IOL for CHTN with superimposed preeclampsia  Labor: little progression with pitocin. will continue to increase pit as baby tolerates. did not check cervix at this time due to spaced out contractions. will check again this evening with plan to AROM. consider pit break this evening as well. Preeclampsia:  on magnesium sulfate and no signs or symptoms of toxicity Fetal Wellbeing:  Category I Pain Control:  Epidural I/D:  gbs neg Anticipated MOD:  NSVD  Rolm BookbinderMoss, Echo Propp 05/30/2014, 4:54 PM

## 2014-05-30 NOTE — Progress Notes (Signed)
Bianca [name] is a 29 y.o. G3P2002 at 1042w6d admitted for induction of labor [facility] with superimposed pre[facility].  Subjective: Pt has headache. No other complaints  Objective: BP 123/78 mmHg  Pulse 91  Temp(Src) 97.2 F (36.2 C) (Axillary)  Resp 16  Ht 5\' 2"  (1.575 m)  Wt 73.029 kg (161 lb)  BMI 29.44 kg/m2  SpO2 98%  LMP 09/14/2013 I/O last 3 completed shifts: In: 3598.3 [P.O.:2490; I.V.:1108.3] Out: 2600 [Urine:2600] Total I/O In: 520 [P.O.:220; I.V.:300] Out: 1000 [Urine:1000]  FHT:  FHR: 125 bpm, variability: moderate,  accelerations:  Present,  decelerations:  Absent UC:   regular, every 3 minutes SVE:   Dilation: 4 Effacement (%): 50 Station: -2 Exam by:: Dr. Rachelle HoraMoss and LEE  Labs: Lab Results  Component Value Date   WBC 14.8* 05/30/2014   HGB 11.4* 05/30/2014   HCT 32.7* 05/30/2014   MCV 91.6 05/30/2014   PLT 223 05/30/2014    Assessment / Plan: IOL for CHTN with superimposed preeclampsia  Labor: on pitocin. little cervical change. will not AROM at this time. Preeclampsia:  on magnesium sulfate and headache. will give tylenol per pt request. Fetal Wellbeing:  Category I Pain Control:  Epidural I/D:  gbs neg Anticipated MOD:  NSVD  Bianca [name] 05/30/2014, 1:42 PM

## 2014-05-31 ENCOUNTER — Encounter (HOSPITAL_COMMUNITY): Payer: Self-pay

## 2014-05-31 LAB — CBC
HEMATOCRIT: 29.2 % — AB (ref 36.0–46.0)
HEMOGLOBIN: 10.2 g/dL — AB (ref 12.0–15.0)
MCH: 32.4 pg (ref 26.0–34.0)
MCHC: 34.9 g/dL (ref 30.0–36.0)
MCV: 92.7 fL (ref 78.0–100.0)
Platelets: 221 10*3/uL (ref 150–400)
RBC: 3.15 MIL/uL — ABNORMAL LOW (ref 3.87–5.11)
RDW: 13.2 % (ref 11.5–15.5)
WBC: 15.9 10*3/uL — ABNORMAL HIGH (ref 4.0–10.5)

## 2014-05-31 LAB — MRSA PCR SCREENING: MRSA BY PCR: NEGATIVE

## 2014-05-31 LAB — RPR: RPR Ser Ql: NONREACTIVE

## 2014-05-31 MED ORDER — ONDANSETRON HCL 4 MG PO TABS: 4.0000 mg | ORAL_TABLET | ORAL | Status: DC | PRN

## 2014-05-31 MED ORDER — OXYCODONE-ACETAMINOPHEN 5-325 MG PO TABS
2.0000 | ORAL_TABLET | ORAL | Status: DC | PRN
  Filled 2014-05-31: qty 2

## 2014-05-31 MED ORDER — PRENATAL MULTIVITAMIN CH
1.0000 | ORAL_TABLET | Freq: Every day | ORAL | Status: DC
  Administered 2014-05-31 – 2014-06-01 (×2): 1 via ORAL
  Filled 2014-05-31 (×2): qty 1

## 2014-05-31 MED ORDER — DIBUCAINE 1 % RE OINT: 1.0000 "application " | TOPICAL_OINTMENT | RECTAL | Status: DC | PRN

## 2014-05-31 MED ORDER — LACTATED RINGERS IV SOLN
INTRAVENOUS | Status: DC
  Administered 2014-05-31 (×3): via INTRAVENOUS

## 2014-05-31 MED ORDER — DIPHENHYDRAMINE HCL 25 MG PO CAPS: 25.0000 mg | ORAL_CAPSULE | Freq: Four times a day (QID) | ORAL | Status: DC | PRN

## 2014-05-31 MED ORDER — ONDANSETRON HCL 4 MG/2ML IJ SOLN: 4.0000 mg | INTRAMUSCULAR | Status: DC | PRN

## 2014-05-31 MED ORDER — LANOLIN HYDROUS EX OINT
TOPICAL_OINTMENT | CUTANEOUS | Status: DC | PRN
Start: 2014-05-31 — End: 2014-06-01

## 2014-05-31 MED ORDER — WITCH HAZEL-GLYCERIN EX PADS: 1.0000 "application " | MEDICATED_PAD | CUTANEOUS | Status: DC | PRN

## 2014-05-31 MED ORDER — SENNOSIDES-DOCUSATE SODIUM 8.6-50 MG PO TABS
2.0000 | ORAL_TABLET | ORAL | Status: DC
  Administered 2014-05-31 – 2014-06-01 (×2): 2 via ORAL
  Filled 2014-05-31 (×2): qty 2

## 2014-05-31 MED ORDER — ZOLPIDEM TARTRATE 5 MG PO TABS: 5.0000 mg | ORAL_TABLET | Freq: Every evening | ORAL | Status: DC | PRN

## 2014-05-31 MED ORDER — TETANUS-DIPHTH-ACELL PERTUSSIS 5-2.5-18.5 LF-MCG/0.5 IM SUSP
0.5000 mL | Freq: Once | INTRAMUSCULAR | Status: DC
  Filled 2014-05-31: qty 0.5

## 2014-05-31 MED ORDER — OXYCODONE-ACETAMINOPHEN 5-325 MG PO TABS
1.0000 | ORAL_TABLET | ORAL | Status: DC | PRN
  Administered 2014-05-31 (×3): 1 via ORAL
  Filled 2014-05-31 (×2): qty 1

## 2014-05-31 MED ORDER — BENZOCAINE-MENTHOL 20-0.5 % EX AERO
1.0000 "application " | INHALATION_SPRAY | CUTANEOUS | Status: DC | PRN
  Administered 2014-05-31: 1 via TOPICAL

## 2014-05-31 MED ORDER — MAGNESIUM SULFATE 40 G IN LACTATED RINGERS - SIMPLE
2.0000 g/h | INTRAVENOUS | Status: AC
  Filled 2014-05-31 (×2): qty 500

## 2014-05-31 MED ORDER — SIMETHICONE 80 MG PO CHEW: 80.0000 mg | CHEWABLE_TABLET | ORAL | Status: DC | PRN

## 2014-05-31 NOTE — Anesthesia Postprocedure Evaluation (Signed)
Anesthesia Post Note  Patient: Bianca [name]  Procedure(s) Performed: * No procedures listed *  Anesthesia type: Epidural  Patient location: Mother/Baby  Post pain: Pain level controlled  Post assessment: Post-op Vital signs reviewed  Last Vitals:  Filed Vitals:   05/31/14 0800  BP: 100/57  Pulse:   Temp: 36.3 C  Resp: 18    Post vital signs: Reviewed  Level of consciousness:alert  Complications: No apparent anesthesia complications

## 2014-05-31 NOTE — Progress Notes (Signed)
Post Partum Day 1 Subjective: no complaints and tolerating PO  Objective: Blood pressure 111/60, pulse 62, temperature 98.4 F (36.9 C), temperature source Oral, resp. rate 16, height 5\' 2"  (1.575 m), weight 73.029 kg (161 lb), last menstrual period 09/14/2013, SpO2 98 %, unknown if currently breastfeeding.  Physical Exam:  General: alert, cooperative and no distress Lochia: appropriate Uterine Fundus: firm DVT Evaluation: No evidence of DVT seen on physical exam.   Recent Labs  05/30/14 0742 05/31/14 0510  HGB 11.4* 10.2*  HCT 32.7* 29.2*    Assessment/Plan: BP normal now [facility] doing well postpartum  LOS: 2 days   ARNOLD,JAMES 05/31/2014, 7:21 AM

## 2014-06-01 LAB — GC/CHLAMYDIA PROBE AMP
CHLAMYDIA, DNA PROBE: NEGATIVE
NEISSERIA GONORRHOEAE BY PCR: NEGATIVE

## 2014-06-01 MED ORDER — IBUPROFEN 600 MG PO TABS: 600.0000 mg | ORAL_TABLET | Freq: Four times a day (QID) | ORAL | Status: DC | PRN

## 2014-06-01 NOTE — Discharge Instructions (Signed)

## 2014-06-01 NOTE — Lactation Note (Signed)
This note was copied from the chart of Bianca Britain Marden NobleKemp. Lactation Consultation Note: Follow up visit with mom. Reports baby has been nursing well but only for 5 min, then she is giving formula. Reports she has pumped 2 times and obtained about 10 cc's. Encouraged to try to keep the baby awake and nursing for 15 min. Baby had formula about 30 min ago and is asleep in bassinet at present. To call for assist prn   Patient Name: Bianca [name]'s Date: 06/01/2014 Reason for consult: Follow-up assessment   Maternal Data Formula Feeding for Exclusion: No  Feeding   LATCH Score/Interventions Latch: Grasps breast easily, tongue down, lips flanged, rhythmical sucking.  Audible Swallowing: A few with stimulation Intervention(s): Skin to skin  Type of Nipple: Everted at rest and after stimulation  Comfort (Breast/Nipple): Soft / non-tender     Hold (Positioning): No assistance needed to correctly position infant at breast.  LATCH Score: 9  Lactation Tools Discussed/Used     Consult Status Consult Status: PRN    Pamelia HoitWeeks, Gloriann Riede D 06/01/2014, 9:49 AM

## 2014-06-01 NOTE — Progress Notes (Signed)
Pt discharged to room 127. Baby is to stay one more day. Pt without complaints or concerns. Discharge instructions and follow care discussed-both pt and SO verbalized understanding.

## 2014-06-01 NOTE — Progress Notes (Signed)
Post Partum Day 2 Subjective: no complaints and tolerating PO. Concerning about baby's weight loss ~ 1 lb, baby evaluated by pediatric team.  Objective: Blood pressure 107/70, pulse 75, temperature 97.7 F (36.5 C), temperature source Oral, resp. rate 18, height 5\' 2"  (1.575 m), weight 161 lb (73.029 kg), last menstrual period 09/14/2013, SpO2 99 %, unknown if currently breastfeeding.  Physical Exam:  General: alert, cooperative and no distress Lochia: appropriate Uterine Fundus: firm DVT Evaluation: No evidence of DVT seen on physical exam.   Recent Labs  05/30/14 0742 05/31/14 0510  HGB 11.4* 10.2*  HCT 32.7* 29.2*    Assessment/Plan: CHTN with superimposed severe preeclampsia, doing well postpartum. BP stable. Breastfeeding. Desires Nexplanon for contraception Likely discharge to home later today; patient may have to room in with infant Routine postpartum care    LOS: 3 days   Bianca [name] A, MD 06/01/2014, 7:45 AM

## 2014-06-01 NOTE — Discharge Summary (Signed)
Obstetric Discharge Summary Reason for Admission: induction of labor  Prenatal Procedures: NST, Preeclampsia and ultrasound Intrapartum Procedures: spontaneous vaginal delivery Postpartum Procedures: none Complications-Operative and Postpartum: none  29 y.o. [id number] @[redacted]w[redacted]d  pt of [facility] was admitted for IOL for CHTN with superimposed preeclampsia based on severe range blood pressures. Foley bulb was placed and came out 05/30/14 in the am. Pt had Pitocin for several hours without change then had AROM at 7 pm. She progressed rapidly to complete and pushed <30 minutes to deliver over intact perineum. Infant placed in mother's arms immediately after delivery. Cord clamped by CNM and cut by FOB. Placenta intact, delivered with cord traction without complication. Pitocin IV bolus after placenta delivery. Infant and mother doing well, plan to transfer in stable condition to mother baby for couplet care.   HEMOGLOBIN  Date Value Ref Range Status  05/31/2014 10.2* 12.0 - 15.0 g/dL Final   HCT  Date Value Ref Range Status  05/31/2014 29.2* 36.0 - 46.0 % Final    Physical Exam:  General: alert, cooperative and no distress Lochia: appropriate Uterine Fundus: firm Incision: n/a DVT Evaluation: No evidence of DVT seen on physical exam. Negative Homan's sign. No cords or calf tenderness. No significant calf/ankle edema.  Discharge Diagnoses: Term Pregnancy-delivered and Preelampsia  Discharge Information: Date: 06/01/2014 Activity: pelvic rest Diet: routine Medications: PNV, Ibuprofen and Baby Aspirin Condition: stable Instructions: refer to practice specific booklet Discharge to: home Follow-up Information    Follow up with [facility]. Go on 06/05/2014.   Why:  as scheduled   Contact information:   [address] [place] [phone]      Newborn Data: Live born female  Birth Weight: 6 lb 10 oz (3005 g) APGAR: 7, 8  Infant being observed in  nursery for 24 hours for weight loss.  Bianca [name] 06/01/2014, 11:32 AM

## 2014-06-02 ENCOUNTER — Ambulatory Visit: Payer: Self-pay

## 2014-06-02 LAB — CULTURE, BETA STREP (GROUP B ONLY): Strep Gp B Culture: NEGATIVE

## 2014-06-02 LAB — HIV ANTIBODY (ROUTINE TESTING W REFLEX): HIV Screen 4th Generation wRfx: NONREACTIVE

## 2014-06-02 NOTE — Lactation Note (Signed)
This note was copied from the chart of Bianca [name]. Lactation Consultation Note      Mom has been breast and bottle feeding, supplementing with alimentum, feeding baby every 3 hours, since she is less than 6 pounds. The baby's weight loss is showing 175, but mom feels the original weight recorded was erroneous. The baby is  voiding and stooling well, so does not appear dehydrated. The baby is being started on double phototherapy today. I set up a DEP for mom, instructed her in its use and part care. I decreased her to 21 flanges with a good fit, and reviewed hand expression. Within 4 minutes, mom had pumped at least 10 mls from each breast. i advised mom to feed the baby EBM prior to formula, and to supplement with alimentum as needed. Mom knows to call for questions/concerns  Patient Name: Bianca Elna Breslowmy Farrelly ZOXWR'UToday's Date: 06/02/2014 Reason for consult: Follow-up assessment   Maternal Data    Feeding    LATCH Score/Interventions                      Lactation Tools Discussed/Used Pump Review: Setup, frequency, and cleaning;Milk Storage;Other (comment) (hand expression reviewed) Initiated by:: clee rn lc Date initiated:: 06/02/14   Consult Status Consult Status: Follow-up Date: 06/03/14 Follow-up type: In-patient    Alfred LevinsLee, Neola Worrall Anne 06/02/2014, 10:44 AM

## 2014-06-02 NOTE — Progress Notes (Signed)
Post discharge chart review completed.  

## 2014-06-05 ENCOUNTER — Encounter: Payer: Managed Care, Other (non HMO) | Admitting: Obstetrics & Gynecology

## 2014-06-05 ENCOUNTER — Ambulatory Visit (INDEPENDENT_AMBULATORY_CARE_PROVIDER_SITE_OTHER): Payer: Managed Care, Other (non HMO) | Admitting: Obstetrics & Gynecology

## 2014-06-05 ENCOUNTER — Encounter: Payer: Self-pay | Admitting: Obstetrics & Gynecology

## 2014-06-05 ENCOUNTER — Other Ambulatory Visit: Payer: Managed Care, Other (non HMO)

## 2014-06-05 VITALS — BP 150/90 | Wt 148.4 lb

## 2014-06-05 DIAGNOSIS — O119 Pre-existing hypertension with pre-eclampsia, unspecified trimester: Secondary | ICD-10-CM

## 2014-06-05 DIAGNOSIS — IMO0002 Reserved for concepts with insufficient information to code with codable children: Secondary | ICD-10-CM

## 2014-06-05 DIAGNOSIS — O1092 Unspecified pre-existing hypertension complicating childbirth: Secondary | ICD-10-CM

## 2014-06-05 NOTE — Progress Notes (Signed)
Patient ID: Bianca [name], female   DOB: Mar 29, 1986, 29 y.o.   MRN: [id number] Delivered last week as an induction for CNS severe features of pre eclampsia Off labetalol  Blood pressure 150/90, weight 148 lb 6.4 oz (67.314 kg), unknown if currently breastfeeding.  Abdomen soft nt/nd  ROS No CNS symptoms, no blurry vision  Restart labetalol 200 BID Re evaluate 4 weeks Nexplanon

## 2014-06-09 ENCOUNTER — Other Ambulatory Visit: Payer: Managed Care, Other (non HMO) | Admitting: Obstetrics & Gynecology

## 2014-07-02 ENCOUNTER — Encounter: Payer: Self-pay | Admitting: Women's Health

## 2014-07-02 ENCOUNTER — Ambulatory Visit (INDEPENDENT_AMBULATORY_CARE_PROVIDER_SITE_OTHER): Payer: Managed Care, Other (non HMO) | Admitting: Women's Health

## 2014-07-02 DIAGNOSIS — O8612 Endometritis following delivery: Secondary | ICD-10-CM

## 2014-07-02 MED ORDER — CIPROFLOXACIN HCL 500 MG PO TABS: 500.0000 mg | ORAL_TABLET | Freq: Two times a day (BID) | ORAL | Status: DC

## 2014-07-02 NOTE — Progress Notes (Signed)
Patient ID: Bianca [name], female   DOB: February 13, 1986, 29 y.o.   MRN: [id number] Subjective:    Bianca [name] is a 29 y.o. [id number] Caucasian female who presents for a postpartum visit. She is 4 weeks postpartum following a spontaneous vaginal delivery at 29.6 gestational weeks after [facility] w/ superimposed severe pre-e. Anesthesia: epidural. I have fully reviewed the prenatal and intrapartum course. Postpartum course has been uncomplicated. Baby's course has been uncomplicated. Baby is feeding by breast x 2 weeks, now bottle. Bleeding minimal bleeding, but lots of small clots. Bowel function is normal. Bladder function is normal. Patient is not sexually active. Last sexual activity: prior to birth of baby. Contraception method is abstinence and and wants nexplanon. Postpartum depression screening: negative. Score 6.  Last pap 10/22/13 and was neg. CHTN dx early in pregnancy- was not d/c'd home on antihypertensives, they were restarted in Feb on visit w/ [facility]. On Labetalol 200mg  bid now.  She has noted some uterine/abdominal tenderness. No fever/chills.   The following portions of the patient's history were reviewed and updated as appropriate: allergies, current medications, past medical history, past surgical history and problem list.  Review of Systems Pertinent items are noted in HPI.   Filed Vitals:   07/02/14 1014  BP: 120/70  Pulse: 72  Weight: 145 lb (65.772 kg)   No LMP recorded.  Objective:   General:  alert, cooperative and no distress   Breasts:  deferred, no complaints  Lungs: clear to auscultation bilaterally  Heart:  regular rate and rhythm  Abdomen: soft, nontender   Vulva: normal  Vagina: normal vagina  Cervix:  closed  Corpus: Well-involuted, + uterine tenderness  Adnexa:  Non-palpable  Rectal Exam: No hemorrhoids        Assessment:   Postpartum exam 4 wks s/p SVB at 36.6wks after IOL for CHTN w/ SI severe pre-e Bottlefeeding Mild endometritis Depression  screening Contraception counseling   Plan:  Rx cipro 500mg  bid x 10d for mild endometritis/uterine tenderness Contraception: abstinence and nexplanon placement on friday  Follow up in: friday for nexplanon placment as already scheduled, then 2weeks for f/u- stop labetalol 2d before   Bianca [name], Junnie Loschiavo Randall CNM, WHNP-BC 07/02/2014 10:54 AM

## 2014-07-02 NOTE — Patient Instructions (Signed)
Stop labetalol 2 days before your visit in 2 weeks  Etonogestrel implant What is this medicine? ETONOGESTREL (et oh noe JES trel) is a contraceptive (birth control) device. It is used to prevent pregnancy. It can be used for up to 3 years. This medicine may be used for other purposes; ask your health care provider or pharmacist if you have questions. COMMON BRAND NAME(S): Implanon, Nexplanon What should I tell my health care provider before I take this medicine? They need to know if you have any of these conditions: -abnormal vaginal bleeding -blood vessel disease or blood clots -cancer of the breast, cervix, or liver -depression -diabetes -gallbladder disease -headaches -heart disease or recent heart attack -high blood pressure -high cholesterol -kidney disease -liver disease -renal disease -seizures -tobacco smoker -an unusual or allergic reaction to etonogestrel, other hormones, anesthetics or antiseptics, medicines, foods, dyes, or preservatives -pregnant or trying to get pregnant -breast-feeding How should I use this medicine? This device is inserted just under the skin on the inner side of your upper arm by a health care professional. Talk to your pediatrician regarding the use of this medicine in children. Special care may be needed. Overdosage: If you think you've taken too much of this medicine contact a poison control center or emergency room at once. Overdosage: If you think you have taken too much of this medicine contact a poison control center or emergency room at once. NOTE: This medicine is only for you. Do not share this medicine with others. What if I miss a dose? This does not apply. What may interact with this medicine? Do not take this medicine with any of the following medications: -amprenavir -bosentan -fosamprenavir This medicine may also interact with the following medications: -barbiturate medicines for inducing sleep or treating seizures -certain  medicines for fungal infections like ketoconazole and itraconazole -griseofulvin -medicines to treat seizures like carbamazepine, felbamate, oxcarbazepine, phenytoin, topiramate -modafinil -phenylbutazone -rifampin -some medicines to treat HIV infection like atazanavir, indinavir, lopinavir, nelfinavir, tipranavir, ritonavir -St. John's wort This list may not describe all possible interactions. Give your health care provider a list of all the medicines, herbs, non-prescription drugs, or dietary supplements you use. Also tell them if you smoke, drink alcohol, or use illegal drugs. Some items may interact with your medicine. What should I watch for while using this medicine? This product does not protect you against HIV infection (AIDS) or other sexually transmitted diseases. You should be able to feel the implant by pressing your fingertips over the skin where it was inserted. Tell your doctor if you cannot feel the implant. What side effects may I notice from receiving this medicine? Side effects that you should report to your doctor or health care professional as soon as possible: -allergic reactions like skin rash, itching or hives, swelling of the face, lips, or tongue -breast lumps -changes in vision -confusion, trouble speaking or understanding -dark urine -depressed mood -general ill feeling or flu-like symptoms -light-colored stools -loss of appetite, nausea -right upper belly pain -severe headaches -severe pain, swelling, or tenderness in the abdomen -shortness of breath, chest pain, swelling in a leg -signs of pregnancy -sudden numbness or weakness of the face, arm or leg -trouble walking, dizziness, loss of balance or coordination -unusual vaginal bleeding, discharge -unusually weak or tired -yellowing of the eyes or skin Side effects that usually do not require medical attention (Report these to your doctor or health care professional if they continue or are  bothersome.): -acne -breast pain -changes in weight -  cough -fever or chills -headache -irregular menstrual bleeding -itching, burning, and vaginal discharge -pain or difficulty passing urine -sore throat This list may not describe all possible side effects. Call your doctor for medical advice about side effects. You may report side effects to FDA at 1-800-FDA-1088. Where should I keep my medicine? This drug is given in a hospital or clinic and will not be stored at home. NOTE: This sheet is a summary. It may not cover all possible information. If you have questions about this medicine, talk to your doctor, pharmacist, or health care provider.  2015, Elsevier/Gold Standard. (2011-10-17 15:37:45)

## 2014-07-03 ENCOUNTER — Ambulatory Visit: Payer: Managed Care, Other (non HMO) | Admitting: Obstetrics & Gynecology

## 2014-07-04 ENCOUNTER — Encounter: Payer: Managed Care, Other (non HMO) | Admitting: Obstetrics & Gynecology

## 2014-07-16 ENCOUNTER — Ambulatory Visit: Payer: Managed Care, Other (non HMO) | Admitting: Women's Health

## 2014-07-16 ENCOUNTER — Encounter: Payer: Self-pay | Admitting: Women's Health

## 2014-08-18 ENCOUNTER — Ambulatory Visit (INDEPENDENT_AMBULATORY_CARE_PROVIDER_SITE_OTHER): Payer: Managed Care, Other (non HMO) | Admitting: Obstetrics and Gynecology

## 2014-08-18 ENCOUNTER — Encounter: Payer: Self-pay | Admitting: Obstetrics and Gynecology

## 2014-08-18 VITALS — BP 122/90 | Ht 62.0 in | Wt 149.0 lb

## 2014-08-18 DIAGNOSIS — Z308 Encounter for other contraceptive management: Secondary | ICD-10-CM | POA: Diagnosis not present

## 2014-08-18 DIAGNOSIS — O165 Unspecified maternal hypertension, complicating the puerperium: Secondary | ICD-10-CM | POA: Insufficient documentation

## 2014-08-18 MED ORDER — NORETHINDRONE ACET-ETHINYL EST 1-20 MG-MCG PO TABS: 1.0000 | ORAL_TABLET | Freq: Every day | ORAL | Status: DC

## 2014-08-18 NOTE — Progress Notes (Signed)
Patient ID: Bianca [name], female   DOB: 22-Aug-1985, 29 y.o.   MRN: [id number] Pt here today for BP check and bleeding. Pt states that she has not stopped bleeding since she delivered. Pt was told to stop taking here labetalol before coming in for this visit.  BP 122/90 mmHg  Ht 5\' 2"  (1.575 m)  Wt 149 lb (67.586 kg)  BMI 27.25 kg/m2  Breastfeeding? No   [facility]  Patient name: Bianca [name] MRN [id number]  Date of birth: 22-Aug-1985  CC & HPI:  Bianca [name] is a 29 y.o. female presenting today for bp rechk  ROS:  No ha scotoma, ruq pain, had heavy menses with resolution over he weekend  Pertinent History Reviewed:   Reviewed: Significant for pp htn that required labetalol for a couple of weeks Medical         Past Medical History  Diagnosis Date  . Hypertension   . History of recurrent UTIs                               Surgical Hx:    Past Surgical History  Procedure Laterality Date  . No past surgeries     Medications: Reviewed & Updated - see associated section                      No current outpatient prescriptions on file.   Social History: Reviewed -  reports that she quit smoking about 4 months ago. Her smoking use included Cigarettes. She has never used smokeless tobacco.  Objective Findings:  Vitals: Blood pressure 122/90, height 5\' 2"  (1.575 m), weight 149 lb (67.586 kg), not currently breastfeeding. was pumping , but dried up/  Physical Examination: General appearance - alert, well appearing, and in no distress, oriented to person, place, and time and normal appearing weight Mental status - alert, oriented to person, place, and time, normal mood, behavior, speech, dress, motor activity, and thought processes Abdomen - soft, nontender, nondistended, no masses or organomegaly Pelvic - deferred. Has had normal exam since del and bleeding is rosolving   Assessment & Plan:   A:  1. Had normal menses finally 2. contr management 3. Resolved pp htn  P:  1.   Loloestrin

## 2015-09-14 ENCOUNTER — Other Ambulatory Visit: Payer: Self-pay | Admitting: Nurse Practitioner

## 2015-09-14 DIAGNOSIS — N6310 Unspecified lump in the right breast, unspecified quadrant: Principal | ICD-10-CM

## 2015-09-14 DIAGNOSIS — N6315 Unspecified lump in the right breast, overlapping quadrants: Secondary | ICD-10-CM

## 2015-09-16 ENCOUNTER — Ambulatory Visit
Admission: RE | Admit: 2015-09-16 | Discharge: 2015-09-16 | Disposition: A | Discharge status: Home / Self Care | Payer: Managed Care, Other (non HMO) | Source: Ambulatory Visit | Attending: Nurse Practitioner | Admitting: Nurse Practitioner

## 2015-09-16 ENCOUNTER — Other Ambulatory Visit: Payer: Self-pay | Admitting: Nurse Practitioner

## 2015-09-16 DIAGNOSIS — N6310 Unspecified lump in the right breast, unspecified quadrant: Principal | ICD-10-CM

## 2015-09-16 DIAGNOSIS — N6315 Unspecified lump in the right breast, overlapping quadrants: Secondary | ICD-10-CM

## 2015-09-17 ENCOUNTER — Other Ambulatory Visit: Payer: Managed Care, Other (non HMO)

## 2015-09-23 ENCOUNTER — Other Ambulatory Visit: Payer: Self-pay | Admitting: Nurse Practitioner

## 2015-09-23 DIAGNOSIS — N6315 Unspecified lump in the right breast, overlapping quadrants: Secondary | ICD-10-CM

## 2015-09-23 DIAGNOSIS — N6310 Unspecified lump in the right breast, unspecified quadrant: Principal | ICD-10-CM

## 2015-09-25 ENCOUNTER — Ambulatory Visit
Admission: RE | Admit: 2015-09-25 | Discharge: 2015-09-25 | Disposition: A | Discharge status: Home / Self Care | Payer: Managed Care, Other (non HMO) | Source: Ambulatory Visit | Attending: Nurse Practitioner | Admitting: Nurse Practitioner

## 2015-09-25 DIAGNOSIS — N6310 Unspecified lump in the right breast, unspecified quadrant: Principal | ICD-10-CM

## 2015-09-25 DIAGNOSIS — N6315 Unspecified lump in the right breast, overlapping quadrants: Secondary | ICD-10-CM

## 2016-09-06 ENCOUNTER — Other Ambulatory Visit (HOSPITAL_COMMUNITY)
Admission: RE | Admit: 2016-09-06 | Discharge: 2016-09-06 | Disposition: A | Discharge status: Home / Self Care | Payer: Managed Care, Other (non HMO) | Source: Ambulatory Visit | Attending: Adult Health | Admitting: Adult Health

## 2016-09-06 ENCOUNTER — Ambulatory Visit (INDEPENDENT_AMBULATORY_CARE_PROVIDER_SITE_OTHER): Payer: Managed Care, Other (non HMO) | Admitting: Adult Health

## 2016-09-06 ENCOUNTER — Encounter: Payer: Self-pay | Admitting: Adult Health

## 2016-09-06 VITALS — BP 118/78 | HR 74 | Ht 60.5 in | Wt 163.0 lb

## 2016-09-06 DIAGNOSIS — N941 Unspecified dyspareunia: Secondary | ICD-10-CM

## 2016-09-06 DIAGNOSIS — Z13220 Encounter for screening for lipoid disorders: Secondary | ICD-10-CM

## 2016-09-06 DIAGNOSIS — Z01419 Encounter for gynecological examination (general) (routine) without abnormal findings: Secondary | ICD-10-CM

## 2016-09-06 DIAGNOSIS — N920 Excessive and frequent menstruation with regular cycle: Secondary | ICD-10-CM

## 2016-09-06 DIAGNOSIS — N946 Dysmenorrhea, unspecified: Secondary | ICD-10-CM | POA: Insufficient documentation

## 2016-09-06 NOTE — Progress Notes (Signed)
Patient ID: Bianca [name], female   DOB: 08-11-1985, 31 y.o.   MRN: [id number] History of Present Illness: [name] is a 31 year old white female, married,G3P3, in for well woman gyn exam and pap. She is complaining of pain with ovulation and periods and with sex, and spotting after sex, has been happening for years.Periods are heavy and changes pads every 30 minutes or so. She is not on any birth control and OK if gets pregnant she says.  Current Medications, Allergies, Past Medical History, Past Surgical History, Family History and Social History were reviewed in [facility] electronic [facility] record.     Review of Systems: Patient denies any headaches, hearing loss, fatigue, blurred vision, shortness of breath, chest pain, problems with  urination. No joint pain or mood swings. See HPI for positives.   Physical Exam:BP 118/78 (BP Location: Left Arm, Patient Position: Sitting, Cuff Size: Normal)   Pulse 74   Ht 5' 0.5" (1.537 m)   Wt 163 lb (73.9 kg)   LMP 08/23/2016 (Approximate)   BMI 31.31 kg/m  General:  Well developed, well nourished, no acute distress Skin:  Warm and dry Neck:  Midline trachea, normal thyroid, good ROM, no lymphadenopathy Lungs; Clear to auscultation bilaterally Breast:  No dominant palpable mass, retraction, or nipple discharge Cardiovascular: Regular rate and rhythm Abdomen:  Soft, non tender, no hepatosplenomegaly Pelvic:  External genitalia is normal in appearance, no lesions.  The vagina is normal in appearance. Urethra has no lesions or masses. The cervix is bulbous.Pap with HPV performed.  Uterus is felt to be normal size, shape, and contour.Mildly tender.  No adnexal masses or tenderness noted.Bladder is non tender, no masses felt. Extremities/musculoskeletal:  No swelling or varicosities noted, no clubbing or cyanosis Psych:  No mood changes, alert and cooperative,seems happy PHQ 2 score 1.Will check labs and get back in for GYN US, and then talk when results  back.   Impression: 1. Pap smear, as part of routine gynecological examination   2. Menorrhagia with regular cycle   3. Dysmenorrhea   4. Dyspareunia, female   5. Screening cholesterol level       Plan: Check CBC,CMP,TSH and lipids Return in 1 week for GYN US Physical in 1 year, pap in 3 if normal Mammogram at 40

## 2016-09-07 LAB — COMPREHENSIVE METABOLIC PANEL
ALK PHOS: 107 IU/L (ref 39–117)
ALT: 17 IU/L (ref 0–32)
AST: 15 IU/L (ref 0–40)
Albumin/Globulin Ratio: 1.7 (ref 1.2–2.2)
Albumin: 4.3 g/dL (ref 3.5–5.5)
BILIRUBIN TOTAL: 0.2 mg/dL (ref 0.0–1.2)
BUN/Creatinine Ratio: 14 (ref 9–23)
BUN: 10 mg/dL (ref 6–20)
CHLORIDE: 100 mmol/L (ref 96–106)
CO2: 24 mmol/L (ref 18–29)
Calcium: 8.6 mg/dL — ABNORMAL LOW (ref 8.7–10.2)
Creatinine, Ser: 0.74 mg/dL (ref 0.57–1.00)
GFR calc Af Amer: 126 mL/min/{1.73_m2} (ref >59)
GFR calc non Af Amer: 109 mL/min/{1.73_m2} (ref >59)
GLUCOSE: 75 mg/dL (ref 65–99)
Globulin, Total: 2.5 g/dL (ref 1.5–4.5)
Potassium: 4.2 mmol/L (ref 3.5–5.2)
Sodium: 140 mmol/L (ref 134–144)
Total Protein: 6.8 g/dL (ref 6.0–8.5)

## 2016-09-07 LAB — LIPID PANEL
CHOL/HDL RATIO: 4.6 ratio — AB (ref 0.0–4.4)
Cholesterol, Total: 161 mg/dL (ref 100–199)
HDL: 35 mg/dL — ABNORMAL LOW (ref >39)
LDL Calculated: 88 mg/dL (ref 0–99)
Triglycerides: 191 mg/dL — ABNORMAL HIGH (ref 0–149)
VLDL Cholesterol Cal: 38 mg/dL (ref 5–40)

## 2016-09-07 LAB — CYTOLOGY - PAP
Diagnosis: NEGATIVE
HPV (WINDOPATH): NOT DETECTED

## 2016-09-07 LAB — CBC
Hematocrit: 46.5 % (ref 34.0–46.6)
Hemoglobin: 15.3 g/dL (ref 11.1–15.9)
MCH: 30.3 pg (ref 26.6–33.0)
MCHC: 32.9 g/dL (ref 31.5–35.7)
MCV: 92 fL (ref 79–97)
PLATELETS: 233 10*3/uL (ref 150–379)
RBC: 5.05 x10E6/uL (ref 3.77–5.28)
RDW: 13.5 % (ref 12.3–15.4)
WBC: 8.3 10*3/uL (ref 3.4–10.8)

## 2016-09-07 LAB — TSH: TSH: 2.17 u[IU]/mL (ref 0.450–4.500)

## 2016-09-13 ENCOUNTER — Ambulatory Visit (INDEPENDENT_AMBULATORY_CARE_PROVIDER_SITE_OTHER): Payer: Managed Care, Other (non HMO)

## 2016-09-13 DIAGNOSIS — N920 Excessive and frequent menstruation with regular cycle: Secondary | ICD-10-CM

## 2016-09-13 DIAGNOSIS — N941 Unspecified dyspareunia: Secondary | ICD-10-CM | POA: Diagnosis not present

## 2016-09-13 DIAGNOSIS — N946 Dysmenorrhea, unspecified: Secondary | ICD-10-CM | POA: Diagnosis not present

## 2016-09-13 NOTE — Progress Notes (Signed)
PELVIC US TA/TV: homogeneous anteverted uterus,wnl,EEC 10.3 mm,normal right ovary,complex left ovarian cyst w/a septation 2.3 x 2 x 1.9 cm,no free fluid,left adnexal pain during ultrasound,ovaries appear mobile

## 2016-09-15 ENCOUNTER — Telehealth: Payer: Self-pay | Admitting: Adult Health

## 2016-09-15 NOTE — Telephone Encounter (Signed)
Pt aware US shows left ovarian cyst, will recheck US  in 1 month.

## 2016-10-14 ENCOUNTER — Other Ambulatory Visit: Payer: Self-pay | Admitting: Adult Health

## 2016-10-14 DIAGNOSIS — N83209 Unspecified ovarian cyst, unspecified side: Secondary | ICD-10-CM

## 2016-10-17 ENCOUNTER — Ambulatory Visit (INDEPENDENT_AMBULATORY_CARE_PROVIDER_SITE_OTHER): Payer: Managed Care, Other (non HMO)

## 2016-10-17 DIAGNOSIS — N83209 Unspecified ovarian cyst, unspecified side: Secondary | ICD-10-CM

## 2016-10-17 NOTE — Progress Notes (Signed)
PELVIC US TA/TV: Homogeneous anteverted uterus,wnl,normal left ovary,dominate follicle right ovary 2 x 1.9 x 2.2 cm,no fee fluid,left adnexal pain during ultrasound,ovaries appear mobile

## 2017-03-28 ENCOUNTER — Inpatient Hospital Stay (HOSPITAL_COMMUNITY)
Admission: AD | Admit: 2017-03-28 | Discharge: 2017-03-28 | Disposition: A | Discharge status: Home / Self Care | Payer: Managed Care, Other (non HMO) | Source: Ambulatory Visit | Attending: Obstetrics and Gynecology | Admitting: Obstetrics and Gynecology

## 2017-03-28 ENCOUNTER — Inpatient Hospital Stay (HOSPITAL_COMMUNITY): Payer: Managed Care, Other (non HMO)

## 2017-03-28 ENCOUNTER — Encounter (HOSPITAL_COMMUNITY): Payer: Self-pay | Admitting: *Deleted

## 2017-03-28 ENCOUNTER — Other Ambulatory Visit: Payer: Self-pay

## 2017-03-28 DIAGNOSIS — Z807 Family history of other malignant neoplasms of lymphoid, hematopoietic and related tissues: Secondary | ICD-10-CM | POA: Diagnosis not present

## 2017-03-28 DIAGNOSIS — Z87442 Personal history of urinary calculi: Secondary | ICD-10-CM

## 2017-03-28 DIAGNOSIS — R1031 Right lower quadrant pain: Secondary | ICD-10-CM | POA: Diagnosis present

## 2017-03-28 DIAGNOSIS — Z87440 Personal history of urinary (tract) infections: Secondary | ICD-10-CM | POA: Diagnosis not present

## 2017-03-28 DIAGNOSIS — Z841 Family history of disorders of kidney and ureter: Secondary | ICD-10-CM | POA: Diagnosis not present

## 2017-03-28 DIAGNOSIS — R112 Nausea with vomiting, unspecified: Secondary | ICD-10-CM

## 2017-03-28 DIAGNOSIS — I10 Essential (primary) hypertension: Secondary | ICD-10-CM | POA: Diagnosis not present

## 2017-03-28 DIAGNOSIS — Z8249 Family history of ischemic heart disease and other diseases of the circulatory system: Secondary | ICD-10-CM | POA: Diagnosis not present

## 2017-03-28 DIAGNOSIS — Z801 Family history of malignant neoplasm of trachea, bronchus and lung: Secondary | ICD-10-CM | POA: Insufficient documentation

## 2017-03-28 DIAGNOSIS — Z8489 Family history of other specified conditions: Secondary | ICD-10-CM | POA: Diagnosis not present

## 2017-03-28 DIAGNOSIS — Z885 Allergy status to narcotic agent status: Secondary | ICD-10-CM | POA: Insufficient documentation

## 2017-03-28 DIAGNOSIS — Z803 Family history of malignant neoplasm of breast: Secondary | ICD-10-CM | POA: Insufficient documentation

## 2017-03-28 DIAGNOSIS — Z791 Long term (current) use of non-steroidal anti-inflammatories (NSAID): Secondary | ICD-10-CM | POA: Diagnosis not present

## 2017-03-28 DIAGNOSIS — Z79899 Other long term (current) drug therapy: Secondary | ICD-10-CM | POA: Diagnosis not present

## 2017-03-28 DIAGNOSIS — M549 Dorsalgia, unspecified: Secondary | ICD-10-CM

## 2017-03-28 DIAGNOSIS — N200 Calculus of kidney: Secondary | ICD-10-CM | POA: Insufficient documentation

## 2017-03-28 DIAGNOSIS — R319 Hematuria, unspecified: Secondary | ICD-10-CM

## 2017-03-28 DIAGNOSIS — Z87891 Personal history of nicotine dependence: Secondary | ICD-10-CM | POA: Diagnosis not present

## 2017-03-28 HISTORY — DX: Calculus of kidney: N20.0

## 2017-03-28 LAB — WET PREP, GENITAL
Clue Cells Wet Prep HPF POC: NONE SEEN
SPERM: NONE SEEN
Trich, Wet Prep: NONE SEEN
YEAST WET PREP: NONE SEEN

## 2017-03-28 LAB — URINALYSIS, ROUTINE W REFLEX MICROSCOPIC
BACTERIA UA: NONE SEEN
BILIRUBIN URINE: NEGATIVE
Glucose, UA: NEGATIVE mg/dL
KETONES UR: 20 mg/dL — AB
Leukocytes, UA: NEGATIVE
Nitrite: NEGATIVE
PH: 5 (ref 5.0–8.0)
Protein, ur: NEGATIVE mg/dL
Specific Gravity, Urine: 1.017 (ref 1.005–1.030)

## 2017-03-28 LAB — POCT PREGNANCY, URINE: Preg Test, Ur: NEGATIVE

## 2017-03-28 MED ORDER — OXYCODONE-ACETAMINOPHEN 5-325 MG PO TABS: 1.0000 | ORAL_TABLET | ORAL | 0 refills | Status: DC | PRN

## 2017-03-28 MED ORDER — TAMSULOSIN HCL 0.4 MG PO CAPS: 0.4000 mg | ORAL_CAPSULE | Freq: Every day | ORAL | 0 refills | Status: AC

## 2017-03-28 MED ORDER — PROMETHAZINE HCL 25 MG PO TABS: 25.0000 mg | ORAL_TABLET | Freq: Four times a day (QID) | ORAL | 0 refills | Status: DC | PRN

## 2017-03-28 MED ORDER — PROMETHAZINE HCL 25 MG RE SUPP: 25.0000 mg | Freq: Four times a day (QID) | RECTAL | 0 refills | Status: DC | PRN

## 2017-03-28 MED ORDER — OXYCODONE-ACETAMINOPHEN 5-325 MG PO TABS
1.0000 | ORAL_TABLET | Freq: Once | ORAL | Status: AC
  Administered 2017-03-28: 1 via ORAL
  Filled 2017-03-28: qty 1

## 2017-03-28 MED ORDER — IBUPROFEN 600 MG PO TABS: 600.0000 mg | ORAL_TABLET | Freq: Four times a day (QID) | ORAL | 0 refills | Status: DC | PRN

## 2017-03-28 MED ORDER — LACTATED RINGERS IV SOLN
INTRAVENOUS | Status: DC
  Administered 2017-03-28: 20:00:00 via INTRAVENOUS

## 2017-03-28 MED ORDER — PROMETHAZINE HCL 25 MG/ML IJ SOLN
25.0000 mg | Freq: Once | INTRAMUSCULAR | Status: AC
  Administered 2017-03-28: 25 mg via INTRAVENOUS
  Filled 2017-03-28: qty 1

## 2017-03-28 NOTE — MAU Note (Addendum)
Pain in RLQ and low back.  Almost feels like a kidney stone, but hurts a whole lot worse in her stomach. Hasn't done preg test. Has seen a little blood in the toilet, states can't really pee, urge to go- only going small drops, lots of pressure.  Pt unable to stand up straight or sit still

## 2017-03-28 NOTE — MAU Provider Note (Signed)
History     CSN: [id number]  Arrival date and time: 03/28/17 1807   First Provider Initiated Contact with Patient 03/28/17 1907      Chief Complaint  Patient presents with  . Abdominal Pain  . Back Pain   HPI Bianca [name] 31 y.o. Comes to MAU with Right sided back pain that radiates to RLQ of her abdomen.  Has dysuria and sees drops of blood in the toilet.  Has had this pain for 2 days.  Has nausea and vomiting.  Vomited last on the way to the hospital.  Has been taking tylenol and ibuprofen but the pain is not relieved.  Last took medication at 5 pm.  History is significant for kidney stones in her pregnancy 2.5 years ago.  Has not seen a physician about the kidney stones.  Her previous ones passed on their own.   OB History    Gravida Para Term Preterm AB Living   3 3 2 1   3    SAB TAB Ectopic Multiple Live Births         0 3      Past Medical History:  Diagnosis Date  . History of recurrent UTIs   . Hypertension   . Kidney stones     Past Surgical History:  Procedure Laterality Date  . NO PAST SURGERIES      Family History  Problem Relation Age of Onset  . Heart disease Paternal Grandmother   . Cancer Paternal Grandmother        breast  . Thyroid disease Father   . Cancer Father        lung  . Hodgkin's lymphoma Father   . Kidney disease Mother        GN3N  . Other Maternal Grandmother        Guillian Barre syndrome    Social History   Tobacco Use  . Smoking status: Former Smoker    Types: Cigarettes    Last attempt to quit: 03/25/2014    Years since quitting: 3.0  . Smokeless tobacco: Never Used  Substance Use Topics  . Alcohol use: No  . Drug use: No    Allergies:  Allergies  Allergen Reactions  . Codeine Hives and Shortness Of Breath    Medications Prior to Admission  Medication Sig Dispense Refill Last Dose  . acetaminophen (TYLENOL) 500 MG tablet Take 1,000 mg by mouth every 6 (six) hours as needed for moderate pain.   03/28/2017 at Unknown  time  . ibuprofen (ADVIL,MOTRIN) 200 MG tablet Take 800 mg by mouth every 6 (six) hours as needed for moderate pain.   03/28/2017 at Unknown time    Review of Systems  Constitutional: Negative for fever.  Gastrointestinal: Positive for abdominal pain, nausea and vomiting. Negative for constipation and diarrhea.  Genitourinary: Positive for decreased urine volume, dysuria, frequency and urgency. Negative for vaginal bleeding and vaginal discharge.  Musculoskeletal: Positive for back pain.   Physical Exam   Blood pressure (!) 143/93, pulse (!) 110, temperature 98.2 F (36.8 C), resp. rate 20, height 5\' 2"  (1.575 m), weight 164 lb 12 oz (74.7 kg), last menstrual period 02/19/2017, SpO2 100 %.  Physical Exam  Nursing note and vitals reviewed. Constitutional: She is oriented to person, place, and time. She appears well-developed and well-nourished.  HENT:  Head: Normocephalic.  Obvious dental caries  Eyes: EOM are normal.  Neck: Neck supple.  Respiratory: Effort normal.  GI: Soft. There is no tenderness. There is  no rebound and no guarding.  Mild tenderness  Genitourinary:  Genitourinary Comments: Speculum exam: Vagina - Minimal discharge, no odor, no blood Cervix - No contact bleeding Bimanual exam: Cervix closed Uterus tender with palpation, normal size Adnexa non tender, no masses bilaterally GC/Chlam, wet prep done Chaperone present for exam.   Musculoskeletal: Normal range of motion.  Neurological: She is alert and oriented to person, place, and time.  Skin: Skin is warm and dry.  Psychiatric: She has a normal mood and affect.    MAU Course  Procedures Results for orders placed or performed during the hospital encounter of 03/28/17 (from the past 24 hour(s))  Urinalysis, Routine w reflex microscopic     Status: Abnormal   Collection Time: 03/28/17  6:23 PM  Result Value Ref Range   Color, Urine YELLOW YELLOW   APPearance HAZY (A) CLEAR   Specific Gravity, Urine 1.017  1.005 - 1.030   pH 5.0 5.0 - 8.0   Glucose, UA NEGATIVE NEGATIVE mg/dL   Hgb urine dipstick LARGE (A) NEGATIVE   Bilirubin Urine NEGATIVE NEGATIVE   Ketones, ur 20 (A) NEGATIVE mg/dL   Protein, ur NEGATIVE NEGATIVE mg/dL   Nitrite NEGATIVE NEGATIVE   Leukocytes, UA NEGATIVE NEGATIVE   RBC / HPF TOO NUMEROUS TO COUNT 0 - 5 RBC/hpf   WBC, UA 0-5 0 - 5 WBC/hpf   Bacteria, UA NONE SEEN NONE SEEN   Squamous Epithelial / LPF 0-5 (A) NONE SEEN   Mucus PRESENT    Ca Oxalate Crys, UA PRESENT   Pregnancy, urine POC     Status: None   Collection Time: 03/28/17  6:35 PM  Result Value Ref Range   Preg Test, Ur NEGATIVE NEGATIVE  Wet prep, genital     Status: Abnormal   Collection Time: 03/28/17  7:18 PM  Result Value Ref Range   Yeast Wet Prep HPF POC NONE SEEN NONE SEEN   Trich, Wet Prep NONE SEEN NONE SEEN   Clue Cells Wet Prep HPF POC NONE SEEN NONE SEEN   WBC, Wet Prep HPF POC FEW (A) NONE SEEN   Sperm NONE SEEN    CLINICAL DATA:  Back pain and right lower quadrant pain  EXAM: RENAL / URINARY TRACT ULTRASOUND COMPLETE  COMPARISON:  03/27/2017  FINDINGS: Right Kidney:  Length: 9.8 cm. Mild hydronephrosis is noted which appears improved from the prior exam. A a nonobstructing renal stone is noted similar to that seen on prior CT examination.  Left Kidney:  Length: 9.8 cm. Echogenicity within normal limits. No mass or hydronephrosis visualized.  Bladder:  Appears normal for degree of bladder distention. Bilateral ureteral jets are noted.  IMPRESSION: Nonobstructing right renal stone.  Improved hydronephrosis is noted when compared with the prior exam.  Bilateral ureteral jets are noted.   MDM Given IV of LR - given Phenergan 25 mg IVP in MAU prior to going to ultrasound.  Assessment and Plan  Nonobstructing right renal stone  Plan Will discharge with phenergan tablets and phenergan suppositories. Will give Percocet here before going home (has had  Percocet for pain relief since allergy to codeine identified - reviewed with hospital pharmacist.) Will eprescribe Flowmax to be taken daily Minimum of 64 ounces of fluids daily. Strain all urine. See a urologist or primary care physician for follow up. Continue to take ibuprofen also to assist with management of pain. If you become worse, [facility] for further care.  Clarence Cogswell L Briston Lax 03/28/2017,  8:12 PM

## 2017-03-28 NOTE — Discharge Instructions (Signed)
Get your medications filled - Flomax, Ibuprofen, Percocet, phenergan - tablets and suppositories Minimum of 64 ounces of fluids daily. Strain all urine. See a urologist or primary care physician for follow up. Continue to take ibuprofen also to assist with management of pain. If you become worse, go to [facility] or [facility] for further care.  In late 2019, the [facility] will be moving to the [facility] campus. At that time, the MAU (Maternity Admissions Unit), where you are being seen today, will no longer see non-pregnant patients. We strongly encourage you to find a doctor's office before that time, so that you can be seen with any GYN concerns, like vaginal discharge, urinary tract infection, etc.. in a timely manner.   In order to make the office visit more convenient, the Center for [facility] at [facility] will be offering evening hours from 4pm-7:30pm on Monday. There will be same-day appointments, walk-in appointments and scheduled appointments available during this time. We will be adding more evening hours over the next year before the move.   Center for [facility] @ [facility] [phone]  For urgent needs, [facility] is also available for management of urgent GYN complaints such as vaginal discharge or urinary tract infections.   Primary care follow up  [facility] (will see you even if you do not have sickle cell): [phone] [facility]: [phone] [facility]: [phone]

## 2017-03-28 NOTE — MAU Note (Signed)
Pt states she feels fine after taking her percocet medication.

## 2017-03-29 LAB — GC/CHLAMYDIA PROBE AMP (~~LOC~~) NOT AT ARMC
Chlamydia: NEGATIVE
NEISSERIA GONORRHEA: NEGATIVE

## 2017-03-30 LAB — URINE CULTURE

## 2017-05-26 ENCOUNTER — Encounter: Payer: Self-pay | Admitting: Cardiology

## 2017-05-26 ENCOUNTER — Ambulatory Visit: Payer: Managed Care, Other (non HMO) | Admitting: Cardiology

## 2017-05-26 VITALS — BP 122/70 | HR 87 | Ht 62.0 in | Wt 166.0 lb

## 2017-05-26 DIAGNOSIS — R002 Palpitations: Secondary | ICD-10-CM

## 2017-05-26 DIAGNOSIS — R0789 Other chest pain: Secondary | ICD-10-CM | POA: Diagnosis not present

## 2017-05-26 NOTE — Patient Instructions (Addendum)
NO MEDICATION CHANGES    SCHEDULE AT [address] Your physician has requested that you have an echocardiogram. Echocardiography is a painless test that uses sound waves to create images of your heart. It provides your doctor with information about the size and shape of your heart and how well your heart's chambers and valves are working. This procedure takes approximately one hour. There are no restrictions for this procedure. AND Your physician has recommended that you wear an event monitor 30 DAYS. Event monitors are medical devices that record the heart's electrical activity. Doctors most often us these monitors to diagnose arrhythmias. Arrhythmias are problems with the speed or rhythm of the heartbeat. The monitor is a small, portable device. You can wear one while you do your normal daily activities. This is usually used to diagnose what is causing palpitations/syncope (passing out).    Your physician recommends that you schedule a follow-up appointment in 2 MONTHS WITH DR HARDING.  

## 2017-05-26 NOTE — Progress Notes (Signed)
PCP: Patient, No Pcp Per -hopes to establish with Dr. Celene SkeenIbarra  Clinic Note: Chief Complaint  Patient presents with  . New Patient (Initial Visit)    Chest pain    HPI: Bianca [name] is a 32 y.o. female who is being seen today for the evaluation of CHEST DISCOMFORT - / PALPITATIONS at the request of Bianca [name], Maria, MD.  Bianca [name] was last seen on 05/11/2017 #@ [facility]  Recent Hospitalizations:   March 28, 2017 admitted with dysuria and abdominal/back pain felt to have nephrolithiasis.  Studies Personally Reviewed - (if available, images/films reviewed: From Epic Chart or Care Everywhere)  None  Interval History: Bianca [name] presents here today noticing symptoms that started coming on over the last 4-5 months where she feels episodes of feeling that her heart is beating forcibly and fast, beating out of her chest.  They may be last a minute or 2 at the most, usually longer.  They can she can have as many as 1 or 2 a day or if he was 1-2 in a week.  Usually it can happen with either rest or when she is up walking around.  It is not necessarily made worse with walking around.  She indicates that when the symptoms occur she feels discomfort in her chest and it takes her breath away.  Usually the Bianca [name] returns once this symptom abates.  She says that when her heart is racing, she feels lightheaded but no syncope or near syncope.  She says she can have episodes that recur off and on over the course of an hour, but the actual symptoms do not last an hour Symptoms are associated with dyspnea almost diaphoresis and nausea as well as discomfort in her left side of her chest.  When the chest discomfort happens it is a dull aching discomfort radiating to the left arm, and as soon as the palpitations go away, the discomfort goes away.  No PND, orthopnea or edema.  No TIA/amaurosis fugax symptoms. No claudication.  ROS: A comprehensive was performed. Review of Systems  Constitutional: Negative for  chills, fever, malaise/fatigue and weight loss.  HENT: Negative for congestion and nosebleeds.   Respiratory: Positive for shortness of breath (With forceful heartbeats). Negative for cough.        Per HPI  Cardiovascular:       Per HPI  Gastrointestinal: Negative for abdominal pain, blood in stool and heartburn.  Genitourinary: Negative for hematuria.  Musculoskeletal: Negative for joint pain.  Neurological: Negative for dizziness (Only with rapid/forceful heartbeats).  Endo/Heme/Allergies: Negative for environmental allergies.  Psychiatric/Behavioral: Negative for depression and memory loss. The patient is nervous/anxious. The patient does not have insomnia.   All other systems reviewed and are negative.   I have reviewed and (if needed) personally updated the patient's problem list, medications, allergies, past medical and surgical history, social and family history.   Past Medical History:  Diagnosis Date  . History of recurrent UTIs   . Hypertension   . Kidney stones     Past Surgical History:  Procedure Laterality Date  . NO PAST SURGERIES      Current Meds  Medication Sig  . acetaminophen (TYLENOL) 500 MG tablet Take 1,000 mg by mouth every 6 (six) hours as needed for moderate pain.  Marland Kitchen. ibuprofen (ADVIL,MOTRIN) 600 MG tablet Take 1 tablet (600 mg total) by mouth every 6 (six) hours as needed.    Allergies  Allergen Reactions  . Codeine Hives and Shortness Of Breath  Social History   Tobacco Use  . Smoking status: Former Smoker    Types: Cigarettes    Last attempt to quit: 03/25/2014    Years since quitting: 3.1  . Smokeless tobacco: Never Used  Substance Use Topics  . Alcohol use: No  . Drug use: No   Social History   Social History Narrative  . Not on file    family history includes Cancer in her father and paternal grandmother; Heart disease in her paternal grandmother; Hodgkin's lymphoma in her father; Kidney disease in her mother; Other in her  maternal grandmother; Sudden Cardiac Death in her father; Thyroid disease in her father.  Wt Readings from Last 3 Encounters:  05/26/17 166 lb (75.3 kg)  03/28/17 164 lb 12 oz (74.7 kg)  09/06/16 163 lb (73.9 kg)    PHYSICAL EXAM BP 122/70   Pulse 87   Ht 5\' 2"  (1.575 m)   Wt 166 lb (75.3 kg)   BMI 30.36 kg/m  Physical Exam  Constitutional: She is oriented to person, place, and time. She appears well-developed and well-nourished. No distress.  HENT:  Head: Normocephalic and atraumatic.  Mouth/Throat: No oropharyngeal exudate.  Poor dentition  Eyes: Conjunctivae are normal. Pupils are equal, round, and reactive to light. No scleral icterus.  Neck: Normal range of motion. Neck supple. No hepatojugular reflux and no JVD present. Carotid bruit is not present. No thyromegaly present.  Cardiovascular: Normal rate, regular rhythm, normal heart sounds and intact distal pulses.  No extrasystoles are present. PMI is not displaced. Exam reveals no gallop and no friction rub.  No murmur heard. Pulmonary/Chest: Effort normal and breath sounds normal. No respiratory distress. She has no wheezes. She has no rales.  Abdominal: Soft. Bowel sounds are normal. She exhibits no distension. There is no tenderness. There is no rebound.  Musculoskeletal: Normal range of motion. She exhibits no edema.  Lymphadenopathy:    She has no cervical adenopathy.  Neurological: She is alert and oriented to person, place, and time. No cranial nerve deficit.  Skin: Skin is warm and dry.  Psychiatric: She has a normal mood and affect. Her behavior is normal. Judgment and thought content normal.  Nursing note and vitals reviewed.     Adult ECG Report  Rate: 87;  Rhythm: normal sinus rhythm and Borderline left axis (-23).  Otherwise normal EKG.  Nonspecific ST changes.;   Narrative Interpretation: No notable change from prior   Other studies Reviewed: Additional studies/ records that were reviewed today include:    Recent Labs:   Lab Results  Component Value Date   CREATININE 0.74 09/06/2016   BUN 10 09/06/2016   NA 140 09/06/2016   K 4.2 09/06/2016   CL 100 09/06/2016   CO2 24 09/06/2016   Lab Results  Component Value Date   CHOL 161 09/06/2016   HDL 35 (L) 09/06/2016   LDLCALC 88 09/06/2016   TRIG 191 (H) 09/06/2016   CHOLHDL 4.6 (H) 09/06/2016    ASSESSMENT / PLAN: Problem List Items Addressed This Visit    Chest discomfort    Chest discomfort that seems to be more related to a rhythm issue or palpitations.  It does not sound anginal in nature at all.  Is not exertional.  Often occurs at rest.    Will check an echocardiogram      Relevant Orders   EKG 12-Lead   CARDIAC EVENT MONITOR   ECHOCARDIOGRAM COMPLETE   Heart palpitations - Primary (Chronic)  Forceful rapid heartbeats sounds most likely like PVCs or PACs with possible short bursts.  We will have aware cardiac event monitor in order to determine if there is a true arrhythmia or simply ectopy. Check 2D echocardiogram to exclude structural heart disease.      Relevant Orders   EKG 12-Lead   CARDIAC EVENT MONITOR   ECHOCARDIOGRAM COMPLETE      Current medicines are reviewed at length with the patient today. (+/- concerns) n/a The following changes have been made: n/a  Patient Instructions  NO MEDICATION CHANGES   SCHEDULE AT [address] Your physician has requested that you have an echocardiogram. Echocardiography is a painless test that uses sound waves to create images of your heart. It provides your doctor with information about the size and shape of your heart and how well your heart's chambers and valves are working. This procedure takes approximately one hour. There are no restrictions for this procedure.  AND Your physician has recommended that you wear an event monitor -- 30 DAYS. Event monitors are medical devices that record the heart's electrical activity. Doctors most often [place]  these monitors to diagnose arrhythmias. Arrhythmias are problems with the speed or rhythm of the heartbeat. The monitor is a small, portable device. You can wear one while you do your normal daily activities. This is usually used to diagnose what is causing palpitations/syncope (passing out).    Your physician recommends that you schedule a follow-up appointment in 2 MONTHS WITH DR HARDING.     Studies Ordered:   Orders Placed This Encounter  Procedures  . CARDIAC EVENT MONITOR  . EKG 12-Lead  . ECHOCARDIOGRAM COMPLETE      Bryan Lemma, M.D., M.S. Interventional Cardiologist   Pager # [phone] Phone # [phone] [address] [place], [place] [address]   Thank you for choosing Heartcare at [facility]!!

## 2017-05-28 ENCOUNTER — Encounter: Payer: Self-pay | Admitting: Cardiology

## 2017-05-28 NOTE — Assessment & Plan Note (Addendum)
Chest discomfort that seems to be more related to a rhythm issue or palpitations.  It does not sound anginal in nature at all.  Is not exertional.  Often occurs at rest.    Will check an echocardiogram

## 2017-05-28 NOTE — Assessment & Plan Note (Signed)
Forceful rapid heartbeats sounds most likely like PVCs or PACs with possible short bursts.  We will have aware cardiac event monitor in order to determine if there is a true arrhythmia or simply ectopy. Check 2D echocardiogram to exclude structural heart disease.

## 2017-06-08 ENCOUNTER — Ambulatory Visit (HOSPITAL_COMMUNITY): Payer: Managed Care, Other (non HMO) | Attending: Internal Medicine

## 2017-06-08 ENCOUNTER — Ambulatory Visit (INDEPENDENT_AMBULATORY_CARE_PROVIDER_SITE_OTHER): Payer: Managed Care, Other (non HMO)

## 2017-06-08 ENCOUNTER — Other Ambulatory Visit: Payer: Self-pay

## 2017-06-08 DIAGNOSIS — I10 Essential (primary) hypertension: Secondary | ICD-10-CM | POA: Diagnosis not present

## 2017-06-08 DIAGNOSIS — R079 Chest pain, unspecified: Secondary | ICD-10-CM | POA: Diagnosis present

## 2017-06-08 DIAGNOSIS — R0789 Other chest pain: Secondary | ICD-10-CM | POA: Diagnosis not present

## 2017-06-08 DIAGNOSIS — R002 Palpitations: Secondary | ICD-10-CM | POA: Diagnosis not present

## 2017-06-08 NOTE — Progress Notes (Signed)
Echo results: Good news: Essentially normal echocardiogram and normal pump function and normal valve function.  EF: 60-65%. No regional wall motion abnormalities - No signs to suggest heart attack.Bryan Lemma.   David Harding, MD  pls fwd to PCP: Patient, No Pcp Per

## 2017-07-25 ENCOUNTER — Ambulatory Visit: Payer: Managed Care, Other (non HMO) | Admitting: Cardiology

## 2017-08-22 ENCOUNTER — Ambulatory Visit: Payer: Managed Care, Other (non HMO) | Admitting: Cardiology

## 2018-06-09 ENCOUNTER — Emergency Department (HOSPITAL_COMMUNITY)
Admission: EM | Admit: 2018-06-09 | Discharge: 2018-06-09 | Disposition: A | Discharge status: Home / Self Care | Payer: Managed Care, Other (non HMO) | Attending: Emergency Medicine | Admitting: Emergency Medicine

## 2018-06-09 ENCOUNTER — Encounter: Payer: Self-pay | Admitting: Emergency Medicine

## 2018-06-09 DIAGNOSIS — I10 Essential (primary) hypertension: Secondary | ICD-10-CM | POA: Diagnosis not present

## 2018-06-09 DIAGNOSIS — F17210 Nicotine dependence, cigarettes, uncomplicated: Secondary | ICD-10-CM | POA: Insufficient documentation

## 2018-06-09 DIAGNOSIS — M79661 Pain in right lower leg: Secondary | ICD-10-CM | POA: Diagnosis present

## 2018-06-09 LAB — POC URINE PREG, ED
PREG TEST UR: NEGATIVE
PREG TEST UR: NEGATIVE

## 2018-06-09 MED ORDER — ENOXAPARIN SODIUM 80 MG/0.8ML ~~LOC~~ SOLN
80.0000 mg | Freq: Once | SUBCUTANEOUS | Status: AC
  Administered 2018-06-09: 16:00:00 80 mg via SUBCUTANEOUS
  Filled 2018-06-09: qty 0.8

## 2018-06-09 MED ORDER — IBUPROFEN 800 MG PO TABS: 800.0000 mg | ORAL_TABLET | Freq: Three times a day (TID) | ORAL | 0 refills | Status: DC

## 2018-06-09 NOTE — ED Triage Notes (Signed)
Pt states she has been having right leg pain in her shin moving down into her foot for several months.  Denies injury.

## 2018-06-09 NOTE — ED Provider Notes (Signed)
[facility] EMERGENCY DEPARTMENT Provider Note   CSN: [id number] Arrival date & time: 06/09/18  1420     History   Chief Complaint Chief Complaint  Patient presents with  . Leg Pain    HPI Bianca [name] is a 33 y.o. female.  HPI   Bianca [name] is a 33 y.o. female who presents to the Emergency Department complaining of persistent right lower leg pain.  Symptoms have been persistent for several months but began worsening for 1 to 2 weeks.  She states the pain now is radiating from her mid right leg down to her foot.  Pain is worse with weightbearing.  She is tried ice packs and warm compresses without relief.  She also states that she feels a "knot" to the front of her lower leg that is painful to touch.  This morning, she noticed some discoloration of her legs that lasted for several minutes.  She denies chest pain, shortness of breath, fever or redness of her leg.  No recent injury.  She is a cigarette smoker, but denies drug use, birth control, sedentary lifestyle or recent travel, and history of previous DVT or PE.   Past Medical History:  Diagnosis Date  . History of recurrent UTIs   . Hypertension   . Kidney stones     Patient Active Problem List   Diagnosis Date Noted  . Heart palpitations 05/26/2017  . Chest discomfort 05/26/2017  . Dyspareunia, female 09/06/2016  . Dysmenorrhea 09/06/2016  . Menorrhagia with regular cycle 09/06/2016  . Postpartum hypertension, resolved 08/18/2014  . NSVD (normal spontaneous vaginal delivery) 05/30/2014  . Preeclampsia, severe 05/29/2014  . Right nephrolithiasis 03/17/2014  . Hydronephrosis determined by ultrasound 03/16/2014  . Hydronephrosis of right kidney 03/15/2014  . Benign essential hypertension antepartum 10/22/2013  . Smoker 10/22/2013  . History of gestational hypertension 10/22/2013    Past Surgical History:  Procedure Laterality Date  . NO PAST SURGERIES       OB History    Gravida  3   Para  3   Term  2   Preterm  1   AB      Living  3     SAB      TAB      Ectopic      Multiple  0   Live Births  3            Home Medications    Prior to Admission medications   Medication Sig Start Date End Date Taking? Authorizing Provider  acetaminophen (TYLENOL) 500 MG tablet Take 1,000 mg by mouth every 6 (six) hours as needed for moderate pain.    [provider]  ibuprofen (ADVIL,MOTRIN) 600 MG tablet Take 1 tablet (600 mg total) by mouth every 6 (six) hours as needed. 03/28/17   Burleson, Brand Males, NP  oxyCODONE-acetaminophen (PERCOCET/ROXICET) 5-325 MG tablet Take 1 tablet by mouth every 4 (four) hours as needed for up to 30 doses for severe pain. 03/28/17   Burleson, Brand Males, NP  promethazine (PHENERGAN) 25 MG suppository Place 1 suppository (25 mg total) rectally every 6 (six) hours as needed for nausea or vomiting. 03/28/17   Burleson, Brand Males, NP  promethazine (PHENERGAN) 25 MG tablet Take 1 tablet (25 mg total) by mouth every 6 (six) hours as needed for nausea or vomiting. 03/28/17   Currie Paris, NP    Family History Family History  Problem Relation Age of Onset  . Heart disease Paternal Grandmother   .  Cancer Paternal Grandmother        breast  . Thyroid disease Father   . Cancer Father        lung  . Hodgkin's lymphoma Father   . Sudden Cardiac Death Father        Was not sure if was an MI or not.  Was during cancer treatment.  No autopsy done  . Kidney disease Mother        GN3N  . Other Maternal Grandmother        Guillian Barre syndrome    Social History Social History   Tobacco Use  . Smoking status: Current Every Day Smoker    Packs/day: 0.50    Types: Cigarettes    Last attempt to quit: 03/25/2014    Years since quitting: 4.2  . Smokeless tobacco: Never Used  Substance Use Topics  . Alcohol use: No  . Drug use: No     Allergies   Codeine   Review of Systems Review of Systems  Constitutional: Negative for chills and fever.    Respiratory: Negative for chest tightness and shortness of breath.   Cardiovascular: Negative for chest pain.  Gastrointestinal: Negative for abdominal pain, nausea and vomiting.  Genitourinary: Negative for difficulty urinating and dysuria.  Musculoskeletal: Positive for myalgias (right lower leg pain). Negative for arthralgias and joint swelling.  Skin: Positive for color change. Negative for wound.  Neurological: Negative for dizziness and weakness.     Physical Exam Updated Vital Signs BP (!) 160/95 (BP Location: Right Arm)   Pulse 70   Temp 97.9 F (36.6 C) (Oral)   Resp 15   Ht 5\' 2"  (1.575 m)   Wt 83.9 kg   LMP 05/28/2018   SpO2 99%   BMI 33.84 kg/m   Physical Exam Vitals signs and nursing note reviewed.  Constitutional:      General: She is not in acute distress.    Appearance: Normal appearance. She is not ill-appearing.  Neck:     Musculoskeletal: Normal range of motion.  Cardiovascular:     Rate and Rhythm: Normal rate and regular rhythm.     Pulses: Normal pulses.     Comments: DP and PT pulses are strong and palpable bilaterally Pulmonary:     Effort: Pulmonary effort is normal.     Breath sounds: Normal breath sounds.  Chest:     Chest wall: No tenderness.  Musculoskeletal:        General: Tenderness present. No signs of injury.     Comments: ttp of the mid right anterior and posterior right lower leg.  Mild varicosities noted.  Extremity is warm without skin discoloration.  No appreciable edema.  Right knee is nontender  Skin:    General: Skin is warm.     Capillary Refill: Capillary refill takes less than 2 seconds.  Neurological:     General: No focal deficit present.     Mental Status: She is alert.     Gait: Gait normal.      ED Treatments / Results  Labs (all labs ordered are listed, but only abnormal results are displayed) Labs Reviewed  POC URINE PREG, ED  POC URINE PREG, ED    EKG None  Radiology No results  found.  Procedures Procedures (including critical care time)  Medications Ordered in ED Medications  enoxaparin (LOVENOX) injection 80 mg (has no administration in time range)     Initial Impression / Assessment and Plan / ED Course  I  have reviewed the triage vital signs and the nursing notes.  Pertinent labs & imaging results that were available during my care of the patient were reviewed by me and considered in my medical decision making (see chart for details).     Patient with worsening right leg pain.  Clinical suspicion for DVT is low, but patient is a smoker.  She does also have some varicosities, so I will schedule outpatient venous imaging of the right lower extremity. Imaging is scheduled for tomorrow 06/10/2018 at 9 AM.  Patient advised of this and she agrees to plan.  Lovenox given  Final Clinical Impressions(s) / ED Diagnoses   Final diagnoses:  Pain in right lower leg    ED Discharge Orders         Ordered    US Venous Img Lower Unilateral Right     06/09/18 1525           Pauline Aus, PA-C 06/09/18 1651    Mancel Bale, MD 06/10/18 1326

## 2018-06-09 NOTE — ED Notes (Signed)
POC pregnancy test negative.

## 2018-06-09 NOTE — Discharge Instructions (Signed)
As discussed, you are scheduled to have an ultrasound of your right lower leg tomorrow morning at 9 AM.  Please arrive by 845 to register.  You may elevate your leg as needed.  Follow-up with your primary doctor for recheck and also to evaluate your blood pressure

## 2018-06-10 ENCOUNTER — Ambulatory Visit (HOSPITAL_COMMUNITY)
Admission: RE | Admit: 2018-06-10 | Discharge: 2018-06-10 | Disposition: A | Discharge status: Home / Self Care | Payer: Managed Care, Other (non HMO) | Source: Ambulatory Visit | Attending: Emergency Medicine | Admitting: Emergency Medicine

## 2018-06-10 DIAGNOSIS — M79669 Pain in unspecified lower leg: Secondary | ICD-10-CM | POA: Diagnosis present

## 2018-07-28 ENCOUNTER — Inpatient Hospital Stay (HOSPITAL_COMMUNITY)
Admission: EM | Admit: 2018-07-28 | Discharge: 2018-07-30 | DRG: 603 | Disposition: A | Discharge status: Home / Self Care | Payer: Managed Care, Other (non HMO) | Attending: Internal Medicine | Admitting: Internal Medicine

## 2018-07-28 ENCOUNTER — Other Ambulatory Visit: Payer: Self-pay

## 2018-07-28 ENCOUNTER — Inpatient Hospital Stay (HOSPITAL_COMMUNITY): Payer: Managed Care, Other (non HMO)

## 2018-07-28 ENCOUNTER — Encounter (HOSPITAL_COMMUNITY): Payer: Self-pay | Admitting: Emergency Medicine

## 2018-07-28 DIAGNOSIS — W5501XA Bitten by cat, initial encounter: Secondary | ICD-10-CM

## 2018-07-28 DIAGNOSIS — S61259D Open bite of unspecified finger without damage to nail, subsequent encounter: Secondary | ICD-10-CM | POA: Diagnosis not present

## 2018-07-28 DIAGNOSIS — I10 Essential (primary) hypertension: Secondary | ICD-10-CM | POA: Diagnosis present

## 2018-07-28 DIAGNOSIS — L03011 Cellulitis of right finger: Principal | ICD-10-CM | POA: Diagnosis present

## 2018-07-28 DIAGNOSIS — W5501XD Bitten by cat, subsequent encounter: Secondary | ICD-10-CM | POA: Diagnosis not present

## 2018-07-28 DIAGNOSIS — Z8249 Family history of ischemic heart disease and other diseases of the circulatory system: Secondary | ICD-10-CM

## 2018-07-28 DIAGNOSIS — F17200 Nicotine dependence, unspecified, uncomplicated: Secondary | ICD-10-CM | POA: Diagnosis not present

## 2018-07-28 DIAGNOSIS — Z87442 Personal history of urinary calculi: Secondary | ICD-10-CM

## 2018-07-28 DIAGNOSIS — Z791 Long term (current) use of non-steroidal anti-inflammatories (NSAID): Secondary | ICD-10-CM

## 2018-07-28 DIAGNOSIS — L089 Local infection of the skin and subcutaneous tissue, unspecified: Secondary | ICD-10-CM | POA: Diagnosis present

## 2018-07-28 DIAGNOSIS — Z885 Allergy status to narcotic agent status: Secondary | ICD-10-CM

## 2018-07-28 DIAGNOSIS — Z87440 Personal history of urinary (tract) infections: Secondary | ICD-10-CM | POA: Diagnosis not present

## 2018-07-28 DIAGNOSIS — L0390 Cellulitis, unspecified: Secondary | ICD-10-CM | POA: Diagnosis present

## 2018-07-28 DIAGNOSIS — F17210 Nicotine dependence, cigarettes, uncomplicated: Secondary | ICD-10-CM | POA: Diagnosis present

## 2018-07-28 DIAGNOSIS — S61250A Open bite of right index finger without damage to nail, initial encounter: Secondary | ICD-10-CM | POA: Diagnosis present

## 2018-07-28 DIAGNOSIS — S61259A Open bite of unspecified finger without damage to nail, initial encounter: Secondary | ICD-10-CM | POA: Diagnosis not present

## 2018-07-28 LAB — CBC WITH DIFFERENTIAL/PLATELET
Abs Immature Granulocytes: 0.02 10*3/uL (ref 0.00–0.07)
Basophils Absolute: 0.1 10*3/uL (ref 0.0–0.1)
Basophils Relative: 1 %
Eosinophils Absolute: 0.1 10*3/uL (ref 0.0–0.5)
Eosinophils Relative: 1 %
HCT: 46 % (ref 36.0–46.0)
Hemoglobin: 15.4 g/dL — ABNORMAL HIGH (ref 12.0–15.0)
Immature Granulocytes: 0 %
Lymphocytes Relative: 29 %
Lymphs Abs: 3 10*3/uL (ref 0.7–4.0)
MCH: 30.6 pg (ref 26.0–34.0)
MCHC: 33.5 g/dL (ref 30.0–36.0)
MCV: 91.3 fL (ref 80.0–100.0)
Monocytes Absolute: 0.4 10*3/uL (ref 0.1–1.0)
Monocytes Relative: 4 %
Neutro Abs: 6.5 10*3/uL (ref 1.7–7.7)
Neutrophils Relative %: 65 %
Platelets: 261 10*3/uL (ref 150–400)
RBC: 5.04 MIL/uL (ref 3.87–5.11)
RDW: 12.7 % (ref 11.5–15.5)
WBC: 10.1 10*3/uL (ref 4.0–10.5)
nRBC: 0 % (ref 0.0–0.2)

## 2018-07-28 LAB — BASIC METABOLIC PANEL
Anion gap: 10 (ref 5–15)
BUN: 8 mg/dL (ref 6–20)
CO2: 24 mmol/L (ref 22–32)
Calcium: 8.2 mg/dL — ABNORMAL LOW (ref 8.9–10.3)
Chloride: 106 mmol/L (ref 98–111)
Creatinine, Ser: 0.75 mg/dL (ref 0.44–1.00)
GFR calc Af Amer: >60 mL/min (ref >60)
GFR calc non Af Amer: >60 mL/min (ref >60)
Glucose, Bld: 92 mg/dL (ref 70–99)
Potassium: 3.9 mmol/L (ref 3.5–5.1)
Sodium: 140 mmol/L (ref 135–145)

## 2018-07-28 MED ORDER — ONDANSETRON HCL 4 MG PO TABS: 4.0000 mg | ORAL_TABLET | Freq: Four times a day (QID) | ORAL | Status: DC | PRN

## 2018-07-28 MED ORDER — OXYCODONE-ACETAMINOPHEN 5-325 MG PO TABS
1.0000 | ORAL_TABLET | ORAL | Status: DC | PRN
  Filled 2018-07-28: qty 1

## 2018-07-28 MED ORDER — KETOROLAC TROMETHAMINE 30 MG/ML IJ SOLN
30.0000 mg | Freq: Four times a day (QID) | INTRAMUSCULAR | Status: DC
  Administered 2018-07-28 – 2018-07-30 (×8): 30 mg via INTRAVENOUS
  Filled 2018-07-28 (×8): qty 1

## 2018-07-28 MED ORDER — HEPARIN SODIUM (PORCINE) 5000 UNIT/ML IJ SOLN
5000.0000 [IU] | Freq: Three times a day (TID) | INTRAMUSCULAR | Status: DC
  Filled 2018-07-28 (×2): qty 1

## 2018-07-28 MED ORDER — SODIUM CHLORIDE 0.9 % IV SOLN
3.0000 g | Freq: Once | INTRAVENOUS | Status: AC
  Administered 2018-07-28: 3 g via INTRAVENOUS
  Filled 2018-07-28: qty 3

## 2018-07-28 MED ORDER — SODIUM CHLORIDE 0.9 % IV SOLN
INTRAVENOUS | Status: AC
  Filled 2018-07-28 (×2): qty 3

## 2018-07-28 MED ORDER — NICOTINE 14 MG/24HR TD PT24
14.0000 mg | MEDICATED_PATCH | Freq: Every day | TRANSDERMAL | Status: DC
  Administered 2018-07-28 – 2018-07-30 (×3): 14 mg via TRANSDERMAL
  Filled 2018-07-28 (×3): qty 1

## 2018-07-28 MED ORDER — ACETAMINOPHEN 325 MG PO TABS: 650.0000 mg | ORAL_TABLET | Freq: Four times a day (QID) | ORAL | Status: DC | PRN

## 2018-07-28 MED ORDER — HYDRALAZINE HCL 20 MG/ML IJ SOLN
10.0000 mg | INTRAMUSCULAR | Status: DC | PRN
  Administered 2018-07-29 – 2018-07-30 (×2): 10 mg via INTRAVENOUS
  Filled 2018-07-28 (×2): qty 1

## 2018-07-28 MED ORDER — ONDANSETRON HCL 4 MG/2ML IJ SOLN
4.0000 mg | Freq: Four times a day (QID) | INTRAMUSCULAR | Status: DC | PRN
  Administered 2018-07-29 – 2018-07-30 (×2): 4 mg via INTRAVENOUS
  Filled 2018-07-28 (×2): qty 2

## 2018-07-28 MED ORDER — ACETAMINOPHEN 650 MG RE SUPP: 650.0000 mg | Freq: Four times a day (QID) | RECTAL | Status: DC | PRN

## 2018-07-28 MED ORDER — SODIUM CHLORIDE 0.9 % IV SOLN
INTRAVENOUS | Status: DC
  Administered 2018-07-28 – 2018-07-29 (×2): via INTRAVENOUS

## 2018-07-28 MED ORDER — SODIUM CHLORIDE 0.9 % IV SOLN
3.0000 g | Freq: Four times a day (QID) | INTRAVENOUS | Status: DC
  Administered 2018-07-28 – 2018-07-30 (×7): 3 g via INTRAVENOUS
  Filled 2018-07-28 (×12): qty 3

## 2018-07-28 NOTE — ED Notes (Addendum)
ED TO INPATIENT HANDOFF REPORT  ED Nurse Name and Phone #: Judie Grieve RN  S Name/Age/Gender Bianca [name] 33 y.o. female Room/Bed: APA12/APA12  Code Status   Code Status: Prior  Home/SNF/Other Home Patient oriented to: self, place, time and situation Is this baseline? Yes   Triage Complete: Triage complete  Chief Complaint cat bite  Triage Note Patient complains of cat bite to right index finger last night. 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 Swelling and redness to right index finger. Denies fever.    Allergies Allergies  Allergen Reactions  . Codeine Hives and Shortness Of Breath    Level of Care/Admitting Diagnosis ED Disposition    ED Disposition Condition Comment   Admit  Hospital [facility] [[id number]]  Level of Care: Med-Surg [16]  Diagnosis: Cellulitis [262035]  Admitting Physician: Erick Blinks [[id number]]  Attending Physician: Erick Blinks [[id number]]  Estimated length of stay: past midnight tomorrow  Certification:: I certify this patient will need inpatient services for at least 2 midnights  PT Class (Do Not Modify): Inpatient [101]  PT Acc Code (Do Not Modify): Private [1]       B Medical/Surgery History Past Medical History:  Diagnosis Date  . History of recurrent UTIs   . Hypertension   . Kidney stones    Past Surgical History:  Procedure Laterality Date  . NO PAST SURGERIES       A IV Location/Drains/Wounds Patient Lines/Drains/Airways Status   Active Line/Drains/Airways    Name:   Placement date:   Placement time:   Site:   Days:   Peripheral IV 07/28/18 Left Antecubital   07/28/18    1428    Antecubital   less than 1          Intake/Output Last 24 hours No intake or output data in the 24 hours ending 07/28/18 1625  Labs/Imaging Results for orders placed or performed during the hospital encounter of 07/28/18 (from the past 48 hour(s))  CBC with Differential/Platelet     Status: Abnormal   Collection Time: 07/28/18  3:30 PM  Result  Value Ref Range   WBC 10.1 4.0 - 10.5 K/uL   RBC 5.04 3.87 - 5.11 MIL/uL   Hemoglobin 15.4 (H) 12.0 - 15.0 g/dL   HCT 59.7 41.6 - 38.4 %   MCV 91.3 80.0 - 100.0 fL   MCH 30.6 26.0 - 34.0 pg   MCHC 33.5 30.0 - 36.0 g/dL   RDW 53.6 46.8 - 03.2 %   Platelets 261 150 - 400 K/uL   nRBC 0.0 0.0 - 0.2 %   Neutrophils Relative % 65 %   Neutro Abs 6.5 1.7 - 7.7 K/uL   Lymphocytes Relative 29 %   Lymphs Abs 3.0 0.7 - 4.0 K/uL   Monocytes Relative 4 %   Monocytes Absolute 0.4 0.1 - 1.0 K/uL   Eosinophils Relative 1 %   Eosinophils Absolute 0.1 0.0 - 0.5 K/uL   Basophils Relative 1 %   Basophils Absolute 0.1 0.0 - 0.1 K/uL   Immature Granulocytes 0 %   Abs Immature Granulocytes 0.02 0.00 - 0.07 K/uL    Comment: Performed at [facility], [address], [place], [place] [address]  Basic metabolic panel     Status: Abnormal   Collection Time: 07/28/18  3:30 PM  Result Value Ref Range   Sodium 140 135 - 145 mmol/L   Potassium 3.9 3.5 - 5.1 mmol/L   Chloride 106 98 - 111 mmol/L   CO2 24 22 -  32 mmol/L   Glucose, Bld 92 70 - 99 mg/dL   BUN 8 6 - 20 mg/dL   Creatinine, Ser 5.46 0.44 - 1.00 mg/dL   Calcium 8.2 (L) 8.9 - 10.3 mg/dL   GFR calc non Af Amer >60 >60 mL/min   GFR calc Af Amer >60 >60 mL/min   Anion gap 10 5 - 15    Comment: Performed at [facility], [address] [place], [place] [address]   No results found.  Pending Labs [employer] (From admission, onward)   None      Vitals/Pain Today's Vitals   07/28/18 1229  Weight: 71.7 kg  Height: 5\' 2"  (1.575 m)  PainSc: 6     Isolation Precautions No active isolations  Medications Medications  Ampicillin-Sulbactam (UNASYN) 3 g in sodium chloride 0.9 % 100 mL IVPB (3 g Intravenous New Bag/Given 07/28/18 1430)    Mobility walks Low fall risk   Focused Assessments entegumentary/musuloskeletal   R Recommendations: See Admitting Provider Note  Report given to: nancy RN  Additional Notes: Cat bite to right  distal index finger.

## 2018-07-28 NOTE — ED Provider Notes (Addendum)
[facility] EMERGENCY DEPARTMENT Provider Note   CSN: [id number] Arrival date & time: 07/28/18  1223    History   Chief Complaint Chief Complaint  Patient presents with  . Animal Bite    HPI Bianca [name] is a 33 y.o. female.     The history is provided by the patient. No language interpreter was used.  Animal Bite  Contact animal:  Cat Location:  Finger Finger injury location:  R index finger Pain details:    Quality:  Aching   Severity:  Moderate   Timing:  Constant   Progression:  Worsening Incident location:  Home Provoked: provoked   Animal's rabies vaccination status:  Not immunized Animal in possession: yes   Tetanus status:  Up to date Relieved by:  Prescription drugs Ineffective treatments:  None tried Pt reports she was bitten by a stray cat.  Pt reports she is on Augmentin.  Pt reports area is now painful and red.   Past Medical History:  Diagnosis Date  . History of recurrent UTIs   . Hypertension   . Kidney stones     Patient Active Problem List   Diagnosis Date Noted  . Heart palpitations 05/26/2017  . Chest discomfort 05/26/2017  . Dyspareunia, female 09/06/2016  . Dysmenorrhea 09/06/2016  . Menorrhagia with regular cycle 09/06/2016  . Postpartum hypertension, resolved 08/18/2014  . NSVD (normal spontaneous vaginal delivery) 05/30/2014  . Preeclampsia, severe 05/29/2014  . Right nephrolithiasis 03/17/2014  . Hydronephrosis determined by ultrasound 03/16/2014  . Hydronephrosis of right kidney 03/15/2014  . Benign essential hypertension antepartum 10/22/2013  . Smoker 10/22/2013  . History of gestational hypertension 10/22/2013    Past Surgical History:  Procedure Laterality Date  . NO PAST SURGERIES       OB History    Gravida  3   Para  3   Term  2   Preterm  1   AB      Living  3     SAB      TAB      Ectopic      Multiple  0   Live Births  3            Home Medications    Prior to Admission medications    Medication Sig Start Date End Date Taking? Authorizing Provider  acetaminophen (TYLENOL) 500 MG tablet Take 1,000 mg by mouth every 6 (six) hours as needed for moderate pain.    [provider]  ibuprofen (ADVIL,MOTRIN) 800 MG tablet Take 1 tablet (800 mg total) by mouth 3 (three) times daily. Take with food 06/09/18   Triplett, Tammy, PA-C  oxyCODONE-acetaminophen (PERCOCET/ROXICET) 5-325 MG tablet Take 1 tablet by mouth every 4 (four) hours as needed for up to 30 doses for severe pain. 03/28/17   Burleson, Brand Males, NP  promethazine (PHENERGAN) 25 MG suppository Place 1 suppository (25 mg total) rectally every 6 (six) hours as needed for nausea or vomiting. 03/28/17   Burleson, Brand Males, NP  promethazine (PHENERGAN) 25 MG tablet Take 1 tablet (25 mg total) by mouth every 6 (six) hours as needed for nausea or vomiting. 03/28/17   Currie Paris, NP    Family History Family History  Problem Relation Age of Onset  . Heart disease Paternal Grandmother   . Cancer Paternal Grandmother        breast  . Thyroid disease Father   . Cancer Father        lung  .  Hodgkin's lymphoma Father   . Sudden Cardiac Death Father        Was not sure if was an MI or not.  Was during cancer treatment.  No autopsy done  . Kidney disease Mother        GN3N  . Other Maternal Grandmother        Guillian Barre syndrome    Social History Social History   Tobacco Use  . Smoking status: Current Every Day Smoker    Packs/day: 0.50    Types: Cigarettes    Last attempt to quit: 03/25/2014    Years since quitting: 4.3  . Smokeless tobacco: Never Used  Substance Use Topics  . Alcohol use: No  . Drug use: No     Allergies   Codeine   Review of Systems Review of Systems  Musculoskeletal: Positive for myalgias.  All other systems reviewed and are negative.    Physical Exam Updated Vital Signs Ht 5\' 2"  (1.575 m)   Wt 71.7 kg   LMP 07/24/2018   BMI 28.90 kg/m   Physical Exam Vitals signs  and nursing note reviewed.  Musculoskeletal:        General: Swelling and tenderness present.     Comments: Swollen tender distal right 2nd finger.  From nv and ns intact   Skin:    General: Skin is warm.  Neurological:     General: No focal deficit present.     Mental Status: She is alert.  Psychiatric:        Mood and Affect: Mood normal.        About 190.  a  ED Treatments / Results  Labs (all labs ordered are listed, but only abnormal results are displayed) Labs Reviewed - No data to display  EKG None  Radiology No results found.  Procedures Procedures (including critical care time)  Medications Ordered in ED Medications - No data to display   Initial Impression / Assessment and Plan / ED Course  I have reviewed the triage vital signs and the nursing notes.  Pertinent labs & imaging results that were available during my care of the patient were reviewed by me and considered in my medical decision making (see chart for details).        MDM  Pt is on Augmentin already.  I spoke to Dr. Romeo Apple who advised admit for Iv antibiotics  unasyn ordered.  Hospitalist consulted. Dr. Kerry Hough will see here. Final Clinical Impressions(s) / ED Diagnoses   Final diagnoses:  Cat bite, initial encounter  Finger infection    ED Discharge Orders    None     An After Visit Summary was printed and given to the patient.    Elson Areas, PA-C 07/28/18 1350    Elson Areas, [place] 07/28/18 1501    Samuel Jester, DO 07/30/18 Ernestina Columbia

## 2018-07-28 NOTE — ED Triage Notes (Signed)
Patient complains of cat bite to right index finger last night. 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 Swelling and redness to right index finger. Denies fever.

## 2018-07-28 NOTE — H&P (Signed)
History and Physical    Bianca [name] ZOX:[id number] DOB: 1986-02-22 DOA: 07/28/2018  PCP: Patient, No Pcp Per  Patient coming from: home  I have personally briefly reviewed patient's old medical [facility]  Chief Complaint: cat bite  HPI: Bianca [name] is a 33 y.o. female with medical history significant of tobacco use, presents to the hospital after suffering a cat bite.  She suffered this bite approximately 2 days ago.  She was in contact with a stray cat that she had brought into her home.  The cat is currently at home and is being observed.  She denies having any fevers.  She was seen by her primary care physician and was prescribed a course of Augmentin.  She took approximately 2 to 3 tablets of this and her symptoms continue to worsen.  She describes worsening swelling, pain in her distal index finger with associated redness.  There is been no drainage.  Her finger is swollen with decreased range of motion.  She was evaluated in the emergency room and case was discussed with orthopedics who recommended admission for intravenous antibiotics.  Review of Systems: As per HPI otherwise 10 point review of systems negative.    Past Medical History:  Diagnosis Date  . History of recurrent UTIs   . Hypertension   . Kidney stones     Past Surgical History:  Procedure Laterality Date  . NO PAST SURGERIES      Social History:  reports that she has been smoking cigarettes. She has been smoking about 0.50 packs per day. She has never used smokeless tobacco. She reports that she does not drink alcohol or use drugs.  Allergies  Allergen Reactions  . Codeine Hives and Shortness Of Breath    Family History  Problem Relation Age of Onset  . Heart disease Paternal Grandmother   . Cancer Paternal Grandmother        breast  . Thyroid disease Father   . Cancer Father        lung  . Hodgkin's lymphoma Father   . Sudden Cardiac Death Father        Was not sure if was an MI or not.   Was during cancer treatment.  No autopsy done  . Kidney disease Mother        GN3N  . Other Maternal Grandmother        Guillian Barre syndrome     Prior to Admission medications   Medication Sig Start Date End Date Taking? Authorizing Provider  acetaminophen (TYLENOL) 500 MG tablet Take 1,000 mg by mouth every 6 (six) hours as needed for moderate pain.    [provider]  ibuprofen (ADVIL,MOTRIN) 800 MG tablet Take 1 tablet (800 mg total) by mouth 3 (three) times daily. Take with food 06/09/18   Triplett, Tammy, PA-C  oxyCODONE-acetaminophen (PERCOCET/ROXICET) 5-325 MG tablet Take 1 tablet by mouth every 4 (four) hours as needed for up to 30 doses for severe pain. 03/28/17   Burleson, Brand Maleserri L, NP  promethazine (PHENERGAN) 25 MG suppository Place 1 suppository (25 mg total) rectally every 6 (six) hours as needed for nausea or vomiting. 03/28/17   Burleson, Brand Maleserri L, NP  promethazine (PHENERGAN) 25 MG tablet Take 1 tablet (25 mg total) by mouth every 6 (six) hours as needed for nausea or vomiting. 03/28/17   Currie ParisBurleson, Terri L, NP    Physical Exam: Vitals:   07/28/18 1229 07/28/18 1634  BP:  (!) 171/110  Weight: 71.7  kg   Height: 5\' 2"  (1.575 m)     Constitutional: NAD, calm, comfortable Eyes: PERRL, lids and conjunctivae normal ENMT: Mucous membranes are moist. Posterior pharynx clear of any exudate or lesions.Normal dentition.  Neck: normal, supple, no masses, no thyromegaly Respiratory: clear to auscultation bilaterally, no wheezing, no crackles. Normal respiratory effort. No accessory muscle use.  Cardiovascular: Regular rate and rhythm, no murmurs / rubs / gallops. No extremity edema. 2+ pedal pulses. No carotid bruits.  Abdomen: no tenderness, no masses palpated. No hepatosplenomegaly. Bowel sounds positive.  Musculoskeletal: no clubbing / cyanosis. No joint deformity upper and lower extremities. Good ROM, no contractures. Normal muscle tone.  Skin: pustule noted over right  index finger with surrounding induration and tenderness of distal phalange with significant swelling of entire finger Neurologic: CN 2-12 grossly intact. Sensation intact, DTR normal. Strength 5/5 in all 4.  Psychiatric: Normal judgment and insight. Alert and oriented x 3. Normal mood.    Labs on Admission: I have personally reviewed following labs and imaging studies  CBC: Recent Labs  Lab 07/28/18 1530  WBC 10.1  NEUTROABS 6.5  HGB 15.4*  HCT 46.0  MCV 91.3  PLT 261   Basic Metabolic Panel: Recent Labs  Lab 07/28/18 1530  NA 140  K 3.9  CL 106  CO2 24  GLUCOSE 92  BUN 8  CREATININE 0.75  CALCIUM 8.2*   GFR: Estimated Creatinine Clearance: 93.6 mL/min (by C-G formula based on SCr of 0.75 mg/dL). Liver Function Tests: No results for input(s): AST, ALT, ALKPHOS, BILITOT, PROT, ALBUMIN in the last 168 hours. No results for input(s): LIPASE, AMYLASE in the last 168 hours. No results for input(s): AMMONIA in the last 168 hours. Coagulation Profile: No results for input(s): INR, PROTIME in the last 168 hours. Cardiac Enzymes: No results for input(s): CKTOTAL, CKMB, CKMBINDEX, TROPONINI in the last 168 hours. BNP (last 3 results) No results for input(s): PROBNP in the last 8760 hours. HbA1C: No results for input(s): HGBA1C in the last 72 hours. CBG: No results for input(s): GLUCAP in the last 168 hours. Lipid Profile: No results for input(s): CHOL, HDL, LDLCALC, TRIG, CHOLHDL, LDLDIRECT in the last 72 hours. Thyroid Function Tests: No results for input(s): TSH, T4TOTAL, FREET4, T3FREE, THYROIDAB in the last 72 hours. Anemia Panel: No results for input(s): VITAMINB12, FOLATE, FERRITIN, TIBC, IRON, RETICCTPCT in the last 72 hours. Urine analysis:    Component Value Date/Time   COLORURINE YELLOW 03/28/2017 1823   APPEARANCEUR HAZY (A) 03/28/2017 1823   LABSPEC 1.017 03/28/2017 1823   PHURINE 5.0 03/28/2017 1823   GLUCOSEU NEGATIVE 03/28/2017 1823   HGBUR LARGE (A)  03/28/2017 1823   BILIRUBINUR NEGATIVE 03/28/2017 1823   KETONESUR 20 (A) 03/28/2017 1823   PROTEINUR NEGATIVE 03/28/2017 1823   UROBILINOGEN 4.0 (H) 05/26/2014 1803   NITRITE NEGATIVE 03/28/2017 1823   LEUKOCYTESUR NEGATIVE 03/28/2017 1823    Radiological Exams on Admission: No results found.   Assessment/Plan Active Problems:   Smoker   Cellulitis   Cat bite of finger     1. Cellulitis secondary to cat bite.  We will start the patient on intravenous Unasyn.  Check plain films to rule out any underlying gas.  Continue supportive management.  If fails to improve, may need incision and drainage. 2. History of tobacco use.  Will provide nicotine patch 3. Elevated blood pressure.  Patient denies any history of hypertension.  Blood pressure may be acutely elevated due to pain.  Use PRN hydralazine.  Monitor for now.  DVT prophylaxis: heparin Code Status: full code  Family Communication: no family present  Disposition Plan: discharge home once infection has improved  Consults called:   Admission status: inpatient, medsurg   Erick Blinks MD [facility]   If 7PM-7AM, please contact night-coverage [email or web address]   07/28/2018, 4:49 PM

## 2018-07-29 DIAGNOSIS — S61259A Open bite of unspecified finger without damage to nail, initial encounter: Secondary | ICD-10-CM

## 2018-07-29 DIAGNOSIS — W5501XA Bitten by cat, initial encounter: Secondary | ICD-10-CM

## 2018-07-29 DIAGNOSIS — S61259D Open bite of unspecified finger without damage to nail, subsequent encounter: Secondary | ICD-10-CM

## 2018-07-29 DIAGNOSIS — L03011 Cellulitis of right finger: Principal | ICD-10-CM

## 2018-07-29 DIAGNOSIS — W5501XD Bitten by cat, subsequent encounter: Secondary | ICD-10-CM

## 2018-07-29 MED ORDER — AMLODIPINE BESYLATE 5 MG PO TABS
5.0000 mg | ORAL_TABLET | Freq: Every day | ORAL | Status: DC
  Administered 2018-07-29 – 2018-07-30 (×2): 5 mg via ORAL
  Filled 2018-07-29 (×2): qty 1

## 2018-07-29 NOTE — Progress Notes (Signed)
PROGRESS NOTE    Bianca [name]  TDD:[id number] DOB: 09-Mar-1986 DOA: 07/28/2018 PCP: Patient, No Pcp Per    Brief Narrative:  33 year old female admitted to the hospital after a cat bit her on the finger.  She developed cellulitis and underlying infection.  She was admitted for IV antibiotics.   Assessment & Plan:   Active Problems:   Smoker   Cellulitis   Cat bite of finger   1. Cellulitis secondary to cat bite.  Patient is currently on intravenous Unasyn.  Overall, clinically she appears to be improving.  Plain films of right hand are unrevealing.  Seen by orthopedics with recommendations to continue current treatments.  Decision on I&D will be made tomorrow. 2. History of tobacco use.  Continue nicotine patch. 3. Elevated blood pressure.  Blood pressure remains elevated.  Will start on Norvasc.   DVT prophylaxis: Heparin Code Status: Full code Family Communication: No family present Disposition Plan: Discharge home once infection has improved   Consultants:   Orthopedics  Procedures:     Antimicrobials:   Unasyn 4/4 >   Subjective: Feels her swelling and finger is better.  Pain is also improving.  Objective: Vitals:   07/28/18 1942 07/28/18 2125 07/29/18 0648 07/29/18 1454  BP:  (!) 142/105 (!) 159/104 (!) 160/94  Pulse: 89 73 62 68  Resp: 16 16 20 18   Temp:  98.3 F (36.8 C) 97.8 F (36.6 C) 98.1 F (36.7 C)  TempSrc:  Oral Oral Oral  SpO2: 99% 98% 98% 99%  Weight:      Height:        Intake/Output Summary (Last 24 hours) at 07/29/2018 1625 Last data filed at 07/29/2018 1500 Gross per 24 hour  Intake 1566.96 ml  Output -  Net 1566.96 ml   Filed Weights   07/28/18 1229 07/28/18 1816  Weight: 71.7 kg 72.3 kg    Examination:  General exam: Appears calm and comfortable  Respiratory system: Clear to auscultation. Respiratory effort normal. Cardiovascular system: S1 & S2 heard, RRR. No JVD, murmurs, rubs, gallops or clicks. No pedal edema.  Gastrointestinal system: Abdomen is nondistended, soft and nontender. No organomegaly or masses felt. Normal bowel sounds heard. Central nervous system: Alert and oriented. No focal neurological deficits. Extremities: Symmetric 5 x 5 power. Skin: overall swelling right index finger is better.  She still has some erythema and a pustule Psychiatry: Judgement and insight appear normal. Mood & affect appropriate.     Data Reviewed: I have personally reviewed following labs and imaging studies  CBC: Recent Labs  Lab 07/28/18 1530  WBC 10.1  NEUTROABS 6.5  HGB 15.4*  HCT 46.0  MCV 91.3  PLT 261   Basic Metabolic Panel: Recent Labs  Lab 07/28/18 1530  NA 140  K 3.9  CL 106  CO2 24  GLUCOSE 92  BUN 8  CREATININE 0.75  CALCIUM 8.2*   GFR: Estimated Creatinine Clearance: 94 mL/min (by C-G formula based on SCr of 0.75 mg/dL). Liver Function Tests: No results for input(s): AST, ALT, ALKPHOS, BILITOT, PROT, ALBUMIN in the last 168 hours. No results for input(s): LIPASE, AMYLASE in the last 168 hours. No results for input(s): AMMONIA in the last 168 hours. Coagulation Profile: No results for input(s): INR, PROTIME in the last 168 hours. Cardiac Enzymes: No results for input(s): CKTOTAL, CKMB, CKMBINDEX, TROPONINI in the last 168 hours. BNP (last 3 results) No results for input(s): PROBNP in the last 8760 hours. HbA1C: No results for input(s): HGBA1C in  the last 72 hours. CBG: No results for input(s): GLUCAP in the last 168 hours. Lipid Profile: No results for input(s): CHOL, HDL, LDLCALC, TRIG, CHOLHDL, LDLDIRECT in the last 72 hours. Thyroid Function Tests: No results for input(s): TSH, T4TOTAL, FREET4, T3FREE, THYROIDAB in the last 72 hours. Anemia Panel: No results for input(s): VITAMINB12, FOLATE, FERRITIN, TIBC, IRON, RETICCTPCT in the last 72 hours. Sepsis Labs: No results for input(s): PROCALCITON, LATICACIDVEN in the last 168 hours.  No results found for this or  any previous visit (from the past 240 hour(s)).       Radiology Studies: Dg Hand Complete Right  Result Date: 07/28/2018 CLINICAL DATA:  Cat bite on Thursday to distal phalanx of 2nd finger on right hand. Pain extends up hand. Redness and swelling present. EXAM: RIGHT HAND - COMPLETE 3+ VIEW COMPARISON:  None. FINDINGS: There is no evidence of fracture or dislocation. There is no evidence of arthropathy or other focal bone abnormality. Soft tissues are unremarkable. IMPRESSION: No acute osseous injury of the right hand. Electronically Signed   By: Elige Ko   On: 07/28/2018 17:26        Scheduled Meds: . heparin  5,000 Units Subcutaneous Q8H  . ketorolac  30 mg Intravenous Q6H  . nicotine  14 mg Transdermal Daily   Continuous Infusions: . sodium chloride 50 mL/hr at 07/29/18 1537  . ampicillin-sulbactam (UNASYN) IV 3 g (07/29/18 1355)     LOS: 1 day    Time spent:    Erick Blinks, MD [facility]   If 7PM-7AM, please contact night-coverage [email or web address]  07/29/2018, 4:25 PM

## 2018-07-29 NOTE — Consult Note (Addendum)
Reason for Consult: Cat bite index finger cellulitis Referring Physician: Dr. [name] is an 33 y.o. female.  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  Bianca [name] is a 33 y.o. female with medical history significant of tobacco use, presents to the hospital after suffering a cat bite.  She suffered this bite approximately 2 days ago.  She was in contact with a stray cat that she had brought into her home.  The cat is currently at home and is being observed.  She denies having any fevers.  She was seen by her primary care physician and was prescribed a course of Augmentin.  She took approximately 2 to 3 tablets of this and her symptoms continue to worsen.  She describes worsening swelling, pain in her distal index finger with associated redness.  There is been no drainage.  Her finger is swollen with decreased range of motion.  She was evaluated in the emergency room and case was discussed with orthopedics who recommended admission for intravenous antibiotics.     Past Medical History:  Diagnosis Date  . History of recurrent UTIs   . Hypertension   . Kidney stones     Past Surgical History:  Procedure Laterality Date  . NO PAST SURGERIES      Family History  Problem Relation Age of Onset  . Heart disease Paternal Grandmother   . Cancer Paternal Grandmother        breast  . Thyroid disease Father   . Cancer Father        lung  . Hodgkin's lymphoma Father   . Sudden Cardiac Death Father        Was not sure if was an MI or not.  Was during cancer treatment.  No autopsy done  . Kidney disease Mother        GN3N  .  Other Maternal Grandmother        Guillian Barre syndrome    Social History:  reports that she has been smoking cigarettes. She has been smoking about 0.50 packs per day. She has never used smokeless tobacco. She reports that she does not drink alcohol or use drugs.  Allergies:  Allergies  Allergen Reactions  . Codeine Hives and Shortness Of Breath    Medications: I have reviewed the patient's current medications.  Results for orders placed or performed during the hospital encounter of 07/28/18 (from the past 48 hour(s))  CBC with Differential/Platelet     Status: Abnormal   Collection Time: 07/28/18  3:30 PM  Result Value Ref Range   WBC 10.1 4.0 - 10.5 K/uL   RBC 5.04 3.87 - 5.11 MIL/uL   Hemoglobin 15.4 (H) 12.0 - 15.0 g/dL   HCT 78.4 69.6 - 29.5 %   MCV 91.3 80.0 - 100.0 fL   MCH 30.6 26.0 - 34.0 pg   MCHC 33.5 30.0 - 36.0 g/dL   RDW 28.4 13.2 - 44.0 %   Platelets 261 150 - 400 K/uL   nRBC 0.0 0.0 - 0.2 %   Neutrophils Relative % 65 %   Neutro Abs 6.5 1.7 -  7.7 K/uL   Lymphocytes Relative 29 %   Lymphs Abs 3.0 0.7 - 4.0 K/uL   Monocytes Relative 4 %   Monocytes Absolute 0.4 0.1 - 1.0 K/uL   Eosinophils Relative 1 %   Eosinophils Absolute 0.1 0.0 - 0.5 K/uL   Basophils Relative 1 %   Basophils Absolute 0.1 0.0 - 0.1 K/uL   Immature Granulocytes 0 %   Abs Immature Granulocytes 0.02 0.00 - 0.07 K/uL    Comment: Performed at [facility], [address], [place], [place] [address]  Basic metabolic panel     Status: Abnormal   Collection Time: 07/28/18  3:30 PM  Result Value Ref Range   Sodium 140 135 - 145 mmol/L   Potassium 3.9 3.5 - 5.1 mmol/L   Chloride 106 98 - 111 mmol/L   CO2 24 22 - 32 mmol/L   Glucose, Bld 92 70 - 99 mg/dL   BUN 8 6 - 20 mg/dL   Creatinine, Ser 0.37 0.44 - 1.00 mg/dL   Calcium 8.2 (L) 8.9 - 10.3 mg/dL   GFR calc non Af Amer >60 >60 mL/min   GFR calc Af Amer >60 >60 mL/min   Anion gap 10 5 - 15    Comment: Performed at [facility],  [address], [place], [place] [address]    Dg Hand Complete Right  Result Date: 07/28/2018 CLINICAL DATA:  Cat bite on Thursday to distal phalanx of 2nd finger on right hand. Pain extends up hand. Redness and swelling present. EXAM: RIGHT HAND - COMPLETE 3+ VIEW COMPARISON:  None. FINDINGS: There is no evidence of fracture or dislocation. There is no evidence of arthropathy or other focal bone abnormality. Soft tissues are unremarkable. IMPRESSION: No acute osseous injury of the right hand. Electronically Signed   By: Elige Ko   On: 07/28/2018 17:26    Review of Systems  Constitutional: Negative for chills and fever.  Musculoskeletal: Negative for joint pain.  Skin:       Erythema and puncture wounds x2  Neurological: Negative for tingling.   Blood pressure (!) 159/104, pulse 62, temperature 97.8 F (36.6 C), temperature source Oral, resp. rate 20, height 5\' 2"  (1.575 m), weight 72.3 kg, last menstrual period 07/24/2018, SpO2 98 %. Physical Exam  Constitutional: She is oriented to person, place, and time. She appears well-developed and well-nourished. No distress.  HENT:  Head: Normocephalic and atraumatic.  Eyes: Pupils are equal, round, and reactive to light. Conjunctivae and EOM are normal. Right eye exhibits no discharge. Left eye exhibits no discharge. No scleral icterus.  Cardiovascular: Normal rate, regular rhythm and intact distal pulses.  No peripheral edema  Musculoskeletal:     Left hand: She exhibits normal range of motion and no tenderness. Normal strength noted.       Hands:  Lymphadenopathy:       Right: No epitrochlear adenopathy present.       Left: No epitrochlear adenopathy present.  Neurological: She is alert and oriented to person, place, and time. She has normal reflexes. She displays no atrophy and no tremor. No sensory deficit. She exhibits normal muscle tone.  Skin: Skin is intact. No rash noted. There is erythema. No pallor.  Psychiatric: She has a normal mood  and affect. Her behavior is normal. Judgment and thought content normal.    Assessment/Plan: Recommend 3 times a day soaking of the index finger with warm salt water for 20 minutes Continue IV antibiotics Tomorrow I will reassess the digit  for possible incision drainage versus home with oral antibiotics and continued soaks Sed rate and C-reactive protein will be ordered for tomorrow morning to follow-up as an outpatient Fuller CanadaStanley Harrison 07/29/2018, 11:14 AM

## 2018-07-30 ENCOUNTER — Telehealth: Payer: Self-pay | Admitting: Orthopedic Surgery

## 2018-07-30 DIAGNOSIS — I10 Essential (primary) hypertension: Secondary | ICD-10-CM

## 2018-07-30 LAB — CBC WITH DIFFERENTIAL/PLATELET
Abs Immature Granulocytes: 0.03 10*3/uL (ref 0.00–0.07)
Basophils Absolute: 0.1 10*3/uL (ref 0.0–0.1)
Basophils Relative: 1 %
Eosinophils Absolute: 0.2 10*3/uL (ref 0.0–0.5)
Eosinophils Relative: 2 %
HCT: 42.7 % (ref 36.0–46.0)
Hemoglobin: 14.5 g/dL (ref 12.0–15.0)
Immature Granulocytes: 0 %
Lymphocytes Relative: 33 %
Lymphs Abs: 2.8 10*3/uL (ref 0.7–4.0)
MCH: 30.5 pg (ref 26.0–34.0)
MCHC: 34 g/dL (ref 30.0–36.0)
MCV: 89.7 fL (ref 80.0–100.0)
Monocytes Absolute: 0.4 10*3/uL (ref 0.1–1.0)
Monocytes Relative: 4 %
Neutro Abs: 5.2 10*3/uL (ref 1.7–7.7)
Neutrophils Relative %: 60 %
Platelets: 230 10*3/uL (ref 150–400)
RBC: 4.76 MIL/uL (ref 3.87–5.11)
RDW: 12.8 % (ref 11.5–15.5)
WBC: 8.6 10*3/uL (ref 4.0–10.5)
nRBC: 0 % (ref 0.0–0.2)

## 2018-07-30 LAB — HIV ANTIBODY (ROUTINE TESTING W REFLEX): HIV Screen 4th Generation wRfx: NONREACTIVE

## 2018-07-30 LAB — C-REACTIVE PROTEIN: CRP: <0.8 mg/dL (ref <1.0)

## 2018-07-30 LAB — SEDIMENTATION RATE: Sed Rate: 3 mm/hr (ref 0–22)

## 2018-07-30 MED ORDER — NICOTINE 14 MG/24HR TD PT24: 14.0000 mg | MEDICATED_PATCH | Freq: Every day | TRANSDERMAL | 0 refills | Status: DC

## 2018-07-30 MED ORDER — AMOXICILLIN-POT CLAVULANATE 875-125 MG PO TABS: 1.0000 | ORAL_TABLET | Freq: Two times a day (BID) | ORAL | 0 refills | Status: DC

## 2018-07-30 MED ORDER — AMLODIPINE BESYLATE 5 MG PO TABS: 5.0000 mg | ORAL_TABLET | Freq: Every day | ORAL | 0 refills | Status: DC

## 2018-07-30 NOTE — Telephone Encounter (Signed)
Per staff message from Dr Romeo Apple, called patient regarding hospital follow-up appointment for right index finger* *Re-verified when to schedule, due to no clinic this Friday (Good Friday, 08/03/18) - per Dr Romeo Apple, schedule for Wednesday, 08/01/18. Called back to patient; scheduled; patient aware.

## 2018-07-30 NOTE — Telephone Encounter (Signed)
-----   Message from Vickki Hearing, MD sent at 07/30/2018  8:11 AM EDT ----- Regarding: follow up Friday in office in person appointment follow-up for cat bite right index finger

## 2018-07-30 NOTE — Clinical Social Work Note (Signed)
Patient scheduled with Dr. Felecia Shelling for 08/30/2018 @ 9:30 a.m. Patient provided appointment information.     Nichoel Digiulio, Juleen China, LCSW

## 2018-07-30 NOTE — Discharge Summary (Signed)
Physician Discharge Summary  Bianca [name] WUJ:[id number] DOB: 1985/08/25 DOA: 07/28/2018  PCP: Patient, No Pcp Per  Admit date: 07/28/2018 Discharge date: 07/30/2018  Admitted From: Home Disposition: Home  Recommendations for Outpatient Follow-up:  1. Follow up with PCP in 1-2 weeks 2. Please obtain BMP/CBC in one week 3. Follow-up with orthopedics on 4/8   Discharge Condition: Stable CODE STATUS: Full code Diet recommendation: Heart healthy  Brief/Interim Summary: 33 year old female was admitted to the hospital after a cat bite around the finger resulting in cellulitis.  She was bitten on her right index finger by a stray cat that she brought into her home.  She reports that the cat's behavior has been normal and is being monitored.  She reports that she is up-to-date on her tetanus vaccinations.  She had taken approximately 2-3 tablets of Augmentin prior to arrival to the emergency room.  When her symptoms including swelling, pain and redness of her finger progressed, she came to the ER for evaluation.  Plain films of her right hand were unrevealing.  She was started on intravenous Unasyn.  She was also followed by orthopedics.  Fortunately, her overall infection has improved.  She is recommended to continue with soaking her finger in warm water with Epsom salts for 20 minutes, 3 times a day.  She will follow-up with orthopedics on 4/8 for follow-up.  Was also noted that she had elevated blood pressures during her hospital stay.  She reports being on antihypertensive medication in the past, but does not recall what this is.  She is been started on Norvasc and can follow-up with a primary care physician for further adjustment of medications.  She is otherwise stable for discharge.  Discharge Diagnoses:  Active Problems:   Smoker   Cellulitis   Cat bite of finger 07/27/2018   Essential hypertension    Discharge Instructions  Discharge Instructions    Diet - low sodium heart healthy   Complete  by:  As directed    Increase activity slowly   Complete by:  As directed      Allergies as of 07/30/2018      Reactions   Codeine Hives, Shortness Of Breath      Medication List    TAKE these medications   acetaminophen 500 MG tablet Commonly known as:  TYLENOL Take 1,000 mg by mouth every 6 (six) hours as needed for moderate pain.   amLODipine 5 MG tablet Commonly known as:  NORVASC Take 1 tablet (5 mg total) by mouth daily. Start taking on:  July 31, 2018   amoxicillin-clavulanate 875-125 MG tablet Commonly known as:  AUGMENTIN Take 1 tablet by mouth 2 (two) times daily.   ibuprofen 800 MG tablet Commonly known as:  ADVIL,MOTRIN Take 1 tablet (800 mg total) by mouth 3 (three) times daily. Take with food   nicotine 14 mg/24hr patch Commonly known as:  NICODERM CQ - dosed in mg/24 hours Place 1 patch (14 mg total) onto the skin daily. Start taking on:  July 31, 2018   Pazeo 0.7 % Soln Generic drug:  Olopatadine HCl Place 1 drop into both eyes daily.   Restasis 0.05 % ophthalmic emulsion Generic drug:  cycloSPORINE Place 1 drop into both eyes 2 (two) times daily.      Follow-up Information    Vickki Hearing, MD On 08/01/2018.   Specialties:  Orthopedic Surgery, Radiology Why:  11:20 Contact information: [address] [place] [address] [phone]  Allergies  Allergen Reactions  . Codeine Hives and Shortness Of Breath    Consultations:  Orthopedics   Procedures/Studies: Dg Hand Complete Right  Result Date: 07/28/2018 CLINICAL DATA:  Cat bite on Thursday to distal phalanx of 2nd finger on right hand. Pain extends up hand. Redness and swelling present. EXAM: RIGHT HAND - COMPLETE 3+ VIEW COMPARISON:  None. FINDINGS: There is no evidence of fracture or dislocation. There is no evidence of arthropathy or other focal bone abnormality. Soft tissues are unremarkable. IMPRESSION: No acute osseous injury of the right hand.  Electronically Signed   By: Elige Ko   On: 07/28/2018 17:26      Subjective: Feeling better.  Right index finger is less painful.  She is noticing some drainage.  Discharge Exam: Vitals:   07/29/18 1454 07/29/18 2017 07/29/18 2152 07/30/18 0517  BP: (!) 160/94  (!) 182/94 (!) 165/107  Pulse: 68  (!) 57 70  Resp: 18  18 19   Temp: 98.1 F (36.7 C)  97.9 F (36.6 C) 97.7 F (36.5 C)  TempSrc: Oral  Oral Oral  SpO2: 99% 98% 99% 99%  Weight:      Height:        General: Pt is alert, awake, not in acute distress Cardiovascular: RRR, S1/S2 +, no rubs, no gallops Respiratory: CTA bilaterally, no wheezing, no rhonchi Abdominal: Soft, NT, ND, bowel sounds + Extremities: Right index finger is less swollen, improved erythema.    The results of significant diagnostics from this hospitalization (including imaging, microbiology, ancillary and laboratory) are listed below for reference.     Microbiology: No results found for this or any previous visit (from the past 240 hour(s)).   Labs: BNP (last 3 results) No results for input(s): BNP in the last 8760 hours. Basic Metabolic Panel: Recent Labs  Lab 07/28/18 1530  NA 140  K 3.9  CL 106  CO2 24  GLUCOSE 92  BUN 8  CREATININE 0.75  CALCIUM 8.2*   Liver Function Tests: No results for input(s): AST, ALT, ALKPHOS, BILITOT, PROT, ALBUMIN in the last 168 hours. No results for input(s): LIPASE, AMYLASE in the last 168 hours. No results for input(s): AMMONIA in the last 168 hours. CBC: Recent Labs  Lab 07/28/18 1530 07/30/18 0506  WBC 10.1 8.6  NEUTROABS 6.5 5.2  HGB 15.4* 14.5  HCT 46.0 42.7  MCV 91.3 89.7  PLT 261 230   Cardiac Enzymes: No results for input(s): CKTOTAL, CKMB, CKMBINDEX, TROPONINI in the last 168 hours. BNP: Invalid input(s): POCBNP CBG: No results for input(s): GLUCAP in the last 168 hours. D-Dimer No results for input(s): DDIMER in the last 72 hours. Hgb A1c No results for input(s): HGBA1C  in the last 72 hours. Lipid Profile No results for input(s): CHOL, HDL, LDLCALC, TRIG, CHOLHDL, LDLDIRECT in the last 72 hours. Thyroid function studies No results for input(s): TSH, T4TOTAL, T3FREE, THYROIDAB in the last 72 hours.  Invalid input(s): FREET3 Anemia work up No results for input(s): VITAMINB12, FOLATE, FERRITIN, TIBC, IRON, RETICCTPCT in the last 72 hours. Urinalysis    Component Value Date/Time   COLORURINE YELLOW 03/28/2017 1823   APPEARANCEUR HAZY (A) 03/28/2017 1823   LABSPEC 1.017 03/28/2017 1823   PHURINE 5.0 03/28/2017 1823   GLUCOSEU NEGATIVE 03/28/2017 1823   HGBUR LARGE (A) 03/28/2017 1823   BILIRUBINUR NEGATIVE 03/28/2017 1823   KETONESUR 20 (A) 03/28/2017 1823   PROTEINUR NEGATIVE 03/28/2017 1823   UROBILINOGEN 4.0 (H) 05/26/2014 1803   NITRITE NEGATIVE  03/28/2017 1823   LEUKOCYTESUR NEGATIVE 03/28/2017 1823   Sepsis Labs Invalid input(s): PROCALCITONIN,  WBC,  LACTICIDVEN Microbiology No results found for this or any previous visit (from the past 240 hour(s)).   Time coordinating discharge: 35mins  SIGNED:   Erick BlinksJehanzeb Sarahbeth Cashin, MD  [facility] 07/30/2018, 6:39 PM   If 7PM-7AM, please contact night-coverage [email or web address]

## 2018-07-30 NOTE — Progress Notes (Signed)
Nsg Discharge Note  Admit Date:  07/28/2018 Discharge date: 07/30/2018   Suan Sillman to be D/C'd Home per MD order.  AVS completed.  Copy for chart, and copy for patient signed, and dated. Patient/caregiver able to verbalize understanding.  Discharge Medication: Allergies as of 07/30/2018      Reactions   Codeine Hives, Shortness Of Breath      Medication List    TAKE these medications   acetaminophen 500 MG tablet Commonly known as:  TYLENOL Take 1,000 mg by mouth every 6 (six) hours as needed for moderate pain.   amLODipine 5 MG tablet Commonly known as:  NORVASC Take 1 tablet (5 mg total) by mouth daily. Start taking on:  July 31, 2018   amoxicillin-clavulanate 875-125 MG tablet Commonly known as:  AUGMENTIN Take 1 tablet by mouth 2 (two) times daily.   ibuprofen 800 MG tablet Commonly known as:  ADVIL,MOTRIN Take 1 tablet (800 mg total) by mouth 3 (three) times daily. Take with food   nicotine 14 mg/24hr patch Commonly known as:  NICODERM CQ - dosed in mg/24 hours Place 1 patch (14 mg total) onto the skin daily. Start taking on:  July 31, 2018   Pazeo 0.7 % Soln Generic drug:  Olopatadine HCl Place 1 drop into both eyes daily.   Restasis 0.05 % ophthalmic emulsion Generic drug:  cycloSPORINE Place 1 drop into both eyes 2 (two) times daily.       Discharge Assessment: Vitals:   07/29/18 2152 07/30/18 0517  BP: (!) 182/94 (!) 165/107  Pulse: (!) 57 70  Resp: 18 19  Temp: 97.9 F (36.6 C) 97.7 F (36.5 C)  SpO2: 99% 99%   Skin clean, dry and intact without evidence of skin break down, no evidence of skin tears noted. IV catheter discontinued intact. Site without signs and symptoms of complications - no redness or edema noted at insertion site, patient denies c/o pain - only slight tenderness at site.  Dressing with slight pressure applied.  D/c Instructions-Education: Discharge instructions given to patient/family with verbalized understanding. D/c education  completed with patient/family including follow up instructions, medication list, d/c activities limitations if indicated, with other d/c instructions as indicated by MD - patient able to verbalize understanding, all questions fully answered. Patient instructed to return to ED, call 911, or call MD for any changes in condition.  Patient escorted via WC, and D/C home via private auto.  Andria Rhein, RN 07/30/2018 12:11 PM

## 2018-07-30 NOTE — Final Consult Note (Signed)
07/28/2018   HD 3   unasyn day 3   Cat bite right index finger   Right index finger but has shown significant improvement on IV antibiotics.  Patient has decreased swelling decreased pain decreased redness in the pustular area has decompressed  Recommend   discharge on Augmentin for 14 days Soak 3 times a day in warm water and Epson salt for 20 minutes Patient would like a nicotine patch Patient expressed desire to take blood pressure medication Follow-up with me on Friday  CBC Latest Ref Rng & Units 07/30/2018 07/28/2018 09/06/2016  WBC 4.0 - 10.5 K/uL 8.6 10.1 8.3  Hemoglobin 12.0 - 15.0 g/dL 93.2 15.4(H) 15.3  Hematocrit 36.0 - 46.0 % 42.7 46.0 46.5  Platelets 150 - 400 K/uL 230 261 233    Erythrocyte Sedimentation Rate     Component Value Date/Time   ESRSEDRATE 3 07/30/2018 0506   C-reactive protein pnding

## 2018-08-01 ENCOUNTER — Encounter: Payer: Self-pay | Admitting: Orthopedic Surgery

## 2018-08-01 ENCOUNTER — Ambulatory Visit (INDEPENDENT_AMBULATORY_CARE_PROVIDER_SITE_OTHER): Payer: Managed Care, Other (non HMO) | Admitting: Orthopedic Surgery

## 2018-08-01 ENCOUNTER — Other Ambulatory Visit: Payer: Self-pay

## 2018-08-01 VITALS — BP 154/97 | HR 74 | Ht 62.0 in | Wt 159.0 lb

## 2018-08-01 DIAGNOSIS — W5501XD Bitten by cat, subsequent encounter: Secondary | ICD-10-CM

## 2018-08-01 DIAGNOSIS — S61259D Open bite of unspecified finger without damage to nail, subsequent encounter: Secondary | ICD-10-CM

## 2018-08-01 NOTE — Patient Instructions (Signed)
Continue soaking 3 x a day  Finish the antibiotics   Virtual visit in 1 week

## 2018-08-01 NOTE — Progress Notes (Signed)
Consultation follow-up visit:  Chief Complaint  Patient presents with  . Finger Injury    cat bite to right index/ improving //around April 2    Encounter Diagnosis  Name Primary?  . Cat bite of finger, subsequent encounter 07/27/2018 Yes   Right index finger continues to improve there is no evidence of felon or paronychia at this time.  She has good range of motion in the digit  We are recommending that she continue soaking continue antibiotics and set up a virtual visit for next week  The consultation and discharge summary listed below   Reason for Consult: Cat bite index finger cellulitis Referring Physician: Dr. [name] is an 33 y.o. female.  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   Bianca [name] is a 33 y.o. female with medical history significant of tobacco use, presents to the hospital after suffering a cat bite.  She suffered this bite approximately 2 days ago.  She was in contact with a stray cat that she had brought into her home.  The cat is currently at home and is being observed.  She denies having any fevers.  She was seen by her primary care physician and was prescribed a course of Augmentin.  She took approximately 2 to 3 tablets of this and her symptoms continue to worsen.  She describes worsening swelling, pain in her distal index finger with associated redness.  There is been no drainage.  Her finger is swollen with decreased range of motion.  She was evaluated in the emergency room and case was discussed with orthopedics who recommended admission for intravenous antibiotics.        Medication List      TAKE these medications   acetaminophen 500 MG tablet Commonly known as:  TYLENOL Take 1,000 mg by mouth every 6 (six) hours as needed for moderate pain.    amLODipine 5 MG tablet Commonly known as:  NORVASC Take 1 tablet (5 mg total) by mouth daily. Start taking on:  July 31, 2018    amoxicillin-clavulanate 875-125 MG tablet Commonly known as:  AUGMENTIN Take 1 tablet by mouth 2 (two) times daily.    ibuprofen 800 MG tablet Commonly known as:  ADVIL,MOTRIN Take 1 tablet (800 mg total) by mouth 3 (three) times daily. Take with food    nicotine 14 mg/24hr patch Commonly known as:  NICODERM CQ - dosed in mg/24 hours Place 1 patch (14 mg total) onto the skin daily. Start taking on:  July 31, 2018    Pazeo 0.7 % Soln Generic drug:  Olopatadine HCl Place 1 drop into both eyes daily.    Restasis 0.05 % ophthalmic emulsion Generic drug:  cycloSPORINE Place 1 drop into both eyes 2 (two) times daily.     Bianca [name] UQJ:[id number] DOB: [birth year] DOA: 07/28/2018   PCP: Patient, No Pcp Per   Admit date: 07/28/2018 Discharge date: 07/30/2018   Admitted From: Home Disposition: Home   Recommendations for Outpatient Follow-up:  1. Follow up with PCP in 1-2 weeks 2. Please obtain BMP/CBC in one week 3. Follow-up with orthopedics on  4/8  

## 2018-08-08 ENCOUNTER — Ambulatory Visit (INDEPENDENT_AMBULATORY_CARE_PROVIDER_SITE_OTHER): Payer: Managed Care, Other (non HMO) | Admitting: Orthopedic Surgery

## 2018-08-08 ENCOUNTER — Other Ambulatory Visit: Payer: Self-pay

## 2018-08-08 DIAGNOSIS — S61200D Unspecified open wound of right index finger without damage to nail, subsequent encounter: Secondary | ICD-10-CM

## 2018-08-08 DIAGNOSIS — S61259D Open bite of unspecified finger without damage to nail, subsequent encounter: Secondary | ICD-10-CM

## 2018-08-08 DIAGNOSIS — W5501XD Bitten by cat, subsequent encounter: Secondary | ICD-10-CM

## 2018-08-08 NOTE — Progress Notes (Signed)
Virtual Visit via Video Note  I connected with Bianca [name] on 08/08/18 at  3:40 PM EDT by a video enabled telemedicine application and verified that I am speaking with the correct person using two identifiers.   I discussed the limitations of evaluation and management by telemedicine and the availability of in person appointments. The patient expressed understanding and agreed to proceed.  History of Present Illness: 33 year old female cat bite index finger  Treated with IV antibiotics followed by oral Augmentin for right index finger cat bite infection  She says she can bend her finger fine she has a little bit of numbness and pain with pressure such as pushing on an aerosol can but overall her finger looks very good   Our plan is to stop the soaks finish her antibiotics and follow-up with [place] if anything turns for the worse but otherwise she has been released   I discussed the assessment and treatment plan with the patient. The patient was provided an opportunity to ask questions and all were answered. The patient agreed with the plan and demonstrated an understanding of the instructions.   The patient was advised to call back or seek an in-person evaluation if the symptoms worsen or if the condition fails to improve as anticipated.  I provided 8 minutes of non-face-to-face time during this encounter.   Fuller Canada, MD

## 2018-08-08 NOTE — Patient Instructions (Signed)
Stop soaks Stop antibiotics

## 2019-06-13 ENCOUNTER — Encounter (HOSPITAL_COMMUNITY): Payer: Self-pay | Admitting: Emergency Medicine

## 2019-06-13 ENCOUNTER — Observation Stay (HOSPITAL_COMMUNITY)
Admission: EM | Admit: 2019-06-13 | Discharge: 2019-06-14 | Disposition: A | Discharge status: Home / Self Care | Payer: Managed Care, Other (non HMO) | Attending: Urology | Admitting: Urology

## 2019-06-13 ENCOUNTER — Other Ambulatory Visit: Payer: Self-pay

## 2019-06-13 ENCOUNTER — Emergency Department (HOSPITAL_COMMUNITY): Payer: Managed Care, Other (non HMO)

## 2019-06-13 DIAGNOSIS — Z79899 Other long term (current) drug therapy: Secondary | ICD-10-CM | POA: Insufficient documentation

## 2019-06-13 DIAGNOSIS — Z8616 Personal history of COVID-19: Secondary | ICD-10-CM | POA: Insufficient documentation

## 2019-06-13 DIAGNOSIS — K219 Gastro-esophageal reflux disease without esophagitis: Secondary | ICD-10-CM | POA: Diagnosis not present

## 2019-06-13 DIAGNOSIS — N132 Hydronephrosis with renal and ureteral calculous obstruction: Principal | ICD-10-CM | POA: Insufficient documentation

## 2019-06-13 DIAGNOSIS — Z87442 Personal history of urinary calculi: Secondary | ICD-10-CM | POA: Insufficient documentation

## 2019-06-13 DIAGNOSIS — N1330 Unspecified hydronephrosis: Secondary | ICD-10-CM

## 2019-06-13 DIAGNOSIS — I10 Essential (primary) hypertension: Secondary | ICD-10-CM

## 2019-06-13 DIAGNOSIS — F17210 Nicotine dependence, cigarettes, uncomplicated: Secondary | ICD-10-CM | POA: Diagnosis not present

## 2019-06-13 DIAGNOSIS — N201 Calculus of ureter: Secondary | ICD-10-CM | POA: Diagnosis present

## 2019-06-13 LAB — URINALYSIS, ROUTINE W REFLEX MICROSCOPIC
Bilirubin Urine: NEGATIVE
Glucose, UA: NEGATIVE mg/dL
Ketones, ur: NEGATIVE mg/dL
Leukocytes,Ua: NEGATIVE
Nitrite: NEGATIVE
Protein, ur: NEGATIVE mg/dL
Specific Gravity, Urine: 1.013 (ref 1.005–1.030)
pH: 6 (ref 5.0–8.0)

## 2019-06-13 LAB — CBC
HCT: 43.6 % (ref 36.0–46.0)
Hemoglobin: 15 g/dL (ref 12.0–15.0)
MCH: 30.8 pg (ref 26.0–34.0)
MCHC: 34.4 g/dL (ref 30.0–36.0)
MCV: 89.5 fL (ref 80.0–100.0)
Platelets: 284 10*3/uL (ref 150–400)
RBC: 4.87 MIL/uL (ref 3.87–5.11)
RDW: 12.9 % (ref 11.5–15.5)
WBC: 10.9 10*3/uL — ABNORMAL HIGH (ref 4.0–10.5)
nRBC: 0 % (ref 0.0–0.2)

## 2019-06-13 LAB — BASIC METABOLIC PANEL
Anion gap: 9 (ref 5–15)
BUN: 7 mg/dL (ref 6–20)
CO2: 24 mmol/L (ref 22–32)
Calcium: 9.1 mg/dL (ref 8.9–10.3)
Chloride: 107 mmol/L (ref 98–111)
Creatinine, Ser: 0.83 mg/dL (ref 0.44–1.00)
GFR calc Af Amer: >60 mL/min (ref >60)
GFR calc non Af Amer: >60 mL/min (ref >60)
Glucose, Bld: 102 mg/dL — ABNORMAL HIGH (ref 70–99)
Potassium: 3.7 mmol/L (ref 3.5–5.1)
Sodium: 140 mmol/L (ref 135–145)

## 2019-06-13 LAB — I-STAT BETA HCG BLOOD, ED (MC, WL, AP ONLY): I-stat hCG, quantitative: <5 m[IU]/mL (ref <5)

## 2019-06-13 MED ORDER — AMLODIPINE BESYLATE 5 MG PO TABS
5.0000 mg | ORAL_TABLET | Freq: Once | ORAL | Status: AC
  Administered 2019-06-13: 5 mg via ORAL
  Filled 2019-06-13: qty 1

## 2019-06-13 MED ORDER — ZOLPIDEM TARTRATE 5 MG PO TABS: 5.0000 mg | ORAL_TABLET | Freq: Every evening | ORAL | Status: DC | PRN

## 2019-06-13 MED ORDER — HYDROMORPHONE HCL 1 MG/ML IJ SOLN
1.0000 mg | Freq: Once | INTRAMUSCULAR | Status: AC
  Administered 2019-06-13: 23:00:00 1 mg via INTRAVENOUS
  Filled 2019-06-13: qty 1

## 2019-06-13 MED ORDER — OXYCODONE-ACETAMINOPHEN 5-325 MG PO TABS
1.0000 | ORAL_TABLET | ORAL | Status: DC | PRN
  Administered 2019-06-14: 1 via ORAL
  Filled 2019-06-13: qty 1

## 2019-06-13 MED ORDER — HYDROMORPHONE HCL 1 MG/ML IJ SOLN
1.0000 mg | Freq: Once | INTRAMUSCULAR | Status: AC
  Administered 2019-06-13: 17:00:00 1 mg via INTRAVENOUS
  Filled 2019-06-13: qty 1

## 2019-06-13 MED ORDER — ONDANSETRON HCL 4 MG/2ML IJ SOLN
4.0000 mg | Freq: Once | INTRAMUSCULAR | Status: AC
  Administered 2019-06-13: 17:00:00 4 mg via INTRAVENOUS
  Filled 2019-06-13: qty 2

## 2019-06-13 MED ORDER — KETOROLAC TROMETHAMINE 30 MG/ML IJ SOLN
30.0000 mg | Freq: Once | INTRAMUSCULAR | Status: AC
  Administered 2019-06-13: 20:00:00 30 mg via INTRAVENOUS
  Filled 2019-06-13: qty 1

## 2019-06-13 MED ORDER — SODIUM CHLORIDE 0.9 % IV BOLUS
500.0000 mL | Freq: Once | INTRAVENOUS | Status: AC
  Administered 2019-06-13: 500 mL via INTRAVENOUS

## 2019-06-13 MED ORDER — HYDROMORPHONE HCL 1 MG/ML IJ SOLN
0.5000 mg | INTRAMUSCULAR | Status: DC | PRN
  Administered 2019-06-14 (×2): 1 mg via INTRAVENOUS
  Filled 2019-06-13 (×2): qty 1

## 2019-06-13 MED ORDER — DIPHENHYDRAMINE HCL 50 MG/ML IJ SOLN
12.5000 mg | Freq: Four times a day (QID) | INTRAMUSCULAR | Status: DC | PRN
  Administered 2019-06-14: 12.5 mg via INTRAVENOUS
  Filled 2019-06-13: qty 1

## 2019-06-13 MED ORDER — DIPHENHYDRAMINE HCL 12.5 MG/5ML PO ELIX: 12.5000 mg | ORAL_SOLUTION | Freq: Four times a day (QID) | ORAL | Status: DC | PRN

## 2019-06-13 MED ORDER — ACETAMINOPHEN 325 MG PO TABS: 650.0000 mg | ORAL_TABLET | ORAL | Status: DC | PRN

## 2019-06-13 MED ORDER — ONDANSETRON HCL 4 MG/2ML IJ SOLN
4.0000 mg | Freq: Once | INTRAMUSCULAR | Status: AC
  Administered 2019-06-13: 22:00:00 4 mg via INTRAVENOUS
  Filled 2019-06-13: qty 2

## 2019-06-13 MED ORDER — SODIUM CHLORIDE 0.9 % IV SOLN: INTRAVENOUS | Status: DC

## 2019-06-13 MED ORDER — HYDROMORPHONE HCL 1 MG/ML IJ SOLN
1.0000 mg | Freq: Once | INTRAMUSCULAR | Status: AC
  Administered 2019-06-13: 1 mg via INTRAVENOUS
  Filled 2019-06-13: qty 1

## 2019-06-13 MED ORDER — PROMETHAZINE HCL 25 MG/ML IJ SOLN
12.5000 mg | Freq: Four times a day (QID) | INTRAMUSCULAR | Status: DC | PRN
  Administered 2019-06-14: 25 mg via INTRAVENOUS
  Filled 2019-06-13: qty 1

## 2019-06-13 NOTE — H&P (View-Only) (Signed)
Urology Admission H&P  Chief Complaint: right flank pain  History of Present Illness: Bianca [name] is a 34yo with a hx of nephrolithiasis who presents to the ER tonight with worsening right flank pain. The pain is sharp, intermittent severe and nonradiating. The pain started to worsen earlier today. She was seen at [facility] on 04/25/2019 for the same right ureteral calculus and was transferred to [facility] for stent placement. Her case was cancelled due to the patient being COVID positive. She has not had additional followup for her calculus. She has associated nausea and vomiting. He pain is poorly controlled with toradol and narcotics. CT obtained in the ER shows a 4x34mm right distal ureteral calculus with severe right hydronephrosis. She denies any fevers/chills/sweats.  Past Medical History:  Diagnosis Date  . History of recurrent UTIs   . Hypertension   . Kidney stones    Past Surgical History:  Procedure Laterality Date  . NO PAST SURGERIES      Home Medications:  Current Facility-Administered Medications  Medication Dose Route Frequency Provider Last Rate Last Admin  . 0.9 %  sodium chloride infusion   Intravenous Continuous Marrell Dicaprio, Candee Furbish, MD      . acetaminophen (TYLENOL) tablet 650 mg  650 mg Oral Q4H PRN Ahava Kissoon, Candee Furbish, MD      . diphenhydrAMINE (BENADRYL) injection 12.5-25 mg  12.5-25 mg Intravenous Q6H PRN Byanka Landrus, Candee Furbish, MD       Or  . diphenhydrAMINE (BENADRYL) 12.5 MG/5ML elixir 12.5-25 mg  12.5-25 mg Oral Q6H PRN Keishaun Hazel, Candee Furbish, MD      . HYDROmorphone (DILAUDID) injection 0.5-1 mg  0.5-1 mg Intravenous Q2H PRN Stephan Draughn, Candee Furbish, MD      . oxyCODONE-acetaminophen (PERCOCET/ROXICET) 5-325 MG per tablet 1-2 tablet  1-2 tablet Oral Q4H PRN Purva Vessell, Candee Furbish, MD      . promethazine (PHENERGAN) injection 12.5-25 mg  12.5-25 mg Intravenous Q6H PRN Cleon Gustin, MD      . zolpidem (AMBIEN) tablet 5 mg  5 mg Oral QHS PRN Alyson Ingles Candee Furbish, MD        Current Outpatient Medications  Medication Sig Dispense Refill Last Dose  . ibuprofen (ADVIL) 200 MG tablet Take 600 mg by mouth every 6 (six) hours as needed (for pain).   06/13/2019 at Unknown time  . omeprazole (PRILOSEC) 40 MG capsule Take 40 mg by mouth daily.   unk at [employer]  . oxyCODONE-acetaminophen (PERCOCET/ROXICET) 5-325 MG tablet Take 1 tablet by mouth every 8 (eight) hours as needed for moderate pain or severe pain.   06/13/2019 at Unknown time  . tamsulosin (FLOMAX) 0.4 MG CAPS capsule Take 0.4 mg by mouth daily.    06/13/2019 at am  . amLODipine (NORVASC) 5 MG tablet Take 1 tablet (5 mg total) by mouth daily. (Patient not taking: Reported on 06/13/2019) 30 tablet 0 Not Taking at Unknown time  . amoxicillin-clavulanate (AUGMENTIN) 875-125 MG tablet Take 1 tablet by mouth 2 (two) times daily. (Patient not taking: Reported on 06/13/2019) 20 tablet 0 Not Taking at Unknown time  . ibuprofen (ADVIL,MOTRIN) 800 MG tablet Take 1 tablet (800 mg total) by mouth 3 (three) times daily. Take with food (Patient not taking: Reported on 06/13/2019) 21 tablet 0 Not Taking at Unknown time  . nicotine (NICODERM CQ - DOSED IN MG/24 HOURS) 14 mg/24hr patch Place 1 patch (14 mg total) onto the skin daily. (Patient not taking: Reported on 06/13/2019) 28 patch 0 Not Taking at Unknown time  Allergies:  Allergies  Allergen Reactions  . Codeine Hives and Shortness Of Breath    Family History  Problem Relation Age of Onset  . Heart disease Paternal Grandmother   . Cancer Paternal Grandmother        breast  . Thyroid disease Father   . Cancer Father        lung  . Hodgkin's lymphoma Father   . Sudden Cardiac Death Father        Was not sure if was an MI or not.  Was during cancer treatment.  No autopsy done  . Kidney disease Mother        GN3N  . Other Maternal Grandmother        Guillian Barre syndrome   Social History:  reports that she has been smoking cigarettes. She has been smoking about 0.50  packs per day. She has never used smokeless tobacco. She reports that she does not drink alcohol or use drugs.  Review of Systems  Gastrointestinal: Positive for nausea and vomiting.  Genitourinary: Positive for flank pain.  All other systems reviewed and are negative.   Physical Exam:  Vital signs in last 24 hours: Temp:  [98 F (36.7 C)-99.1 F (37.3 C)] 98 F (36.7 C) (02/18 1840) Pulse Rate:  [99-104] 99 (02/18 2221) Resp:  [20-22] 20 (02/18 2221) BP: (164-191)/(87-127) 182/120 (02/18 2221) SpO2:  [98 %-100 %] 99 % (02/18 2221) Physical Exam  Constitutional: She is oriented to person, place, and time. She appears well-developed and well-nourished.  HENT:  Head: Normocephalic and atraumatic.  Eyes: Pupils are equal, round, and reactive to light. EOM are normal.  Neck: No thyromegaly present.  Cardiovascular: Normal rate and regular rhythm.  Respiratory: Effort normal. No respiratory distress.  GI: Soft. She exhibits no distension.  Musculoskeletal:        General: No edema. Normal range of motion.     Cervical back: Normal range of motion.  Neurological: She is alert and oriented to person, place, and time.  Skin: Skin is warm and dry.  Psychiatric: She has a normal mood and affect. Her behavior is normal. Judgment and thought content normal.    Laboratory Data:  Results for orders placed or performed during the hospital encounter of 06/13/19 (from the past 24 hour(s))  Basic metabolic panel     Status: Abnormal   Collection Time: 06/13/19  3:26 PM  Result Value Ref Range   Sodium 140 135 - 145 mmol/L   Potassium 3.7 3.5 - 5.1 mmol/L   Chloride 107 98 - 111 mmol/L   CO2 24 22 - 32 mmol/L   Glucose, Bld 102 (H) 70 - 99 mg/dL   BUN 7 6 - 20 mg/dL   Creatinine, Ser 7.51 0.44 - 1.00 mg/dL   Calcium 9.1 8.9 - 02.5 mg/dL   GFR calc non Af Amer >60 >60 mL/min   GFR calc Af Amer >60 >60 mL/min   Anion gap 9 5 - 15  CBC     Status: Abnormal   Collection Time: 06/13/19   3:26 PM  Result Value Ref Range   WBC 10.9 (H) 4.0 - 10.5 K/uL   RBC 4.87 3.87 - 5.11 MIL/uL   Hemoglobin 15.0 12.0 - 15.0 g/dL   HCT 85.2 77.8 - 24.2 %   MCV 89.5 80.0 - 100.0 fL   MCH 30.8 26.0 - 34.0 pg   MCHC 34.4 30.0 - 36.0 g/dL   RDW 35.3 61.4 - 43.1 %  Platelets 284 150 - 400 K/uL   nRBC 0.0 0.0 - 0.2 %  I-Stat beta hCG blood, ED     Status: None   Collection Time: 06/13/19  4:08 PM  Result Value Ref Range   I-stat hCG, quantitative <5.0 <5 mIU/mL   Comment 3          Urinalysis, Routine w reflex microscopic- may I&O cath if menses     Status: Abnormal   Collection Time: 06/13/19  5:00 PM  Result Value Ref Range   Color, Urine YELLOW YELLOW   APPearance CLEAR CLEAR   Specific Gravity, Urine 1.013 1.005 - 1.030   pH 6.0 5.0 - 8.0   Glucose, UA NEGATIVE NEGATIVE mg/dL   Hgb urine dipstick LARGE (A) NEGATIVE   Bilirubin Urine NEGATIVE NEGATIVE   Ketones, ur NEGATIVE NEGATIVE mg/dL   Protein, ur NEGATIVE NEGATIVE mg/dL   Nitrite NEGATIVE NEGATIVE   Leukocytes,Ua NEGATIVE NEGATIVE   RBC / HPF 11-20 0 - 5 RBC/hpf   WBC, UA 0-5 0 - 5 WBC/hpf   Bacteria, UA RARE (A) NONE SEEN   Squamous Epithelial / LPF 0-5 0 - 5   Mucus PRESENT    No results found for this or any previous visit (from the past 240 hour(s)). Creatinine: Recent Labs    06/13/19 1526  CREATININE 0.83   Baseline Creatinine: 0.8  Impression/Assessment:  33yo with right ureteral calculus, intractable pain  Plan:  1. The patient will be admitted for pain and nausea/vomiting. She will be scheduled for right ureteral stent placement  Wilkie Aye 06/13/2019, 10:35 PM

## 2019-06-13 NOTE — H&P (Signed)
Urology Admission H&P  Chief Complaint: right flank pain  History of Present Illness: Ms Bianca [name] is a 34yo with a hx of nephrolithiasis who presents to the ER tonight with worsening right flank pain. The pain is sharp, intermittent severe and nonradiating. The pain started to worsen earlier today. She was seen at [facility] on 04/25/2019 for the same right ureteral calculus and was transferred to [facility] for stent placement. Her case was cancelled due to the patient being COVID positive. She has not had additional followup for her calculus. She has associated nausea and vomiting. He pain is poorly controlled with toradol and narcotics. CT obtained in the ER shows a 4x34mm right distal ureteral calculus with severe right hydronephrosis. She denies any fevers/chills/sweats.  Past Medical History:  Diagnosis Date  . History of recurrent UTIs   . Hypertension   . Kidney stones    Past Surgical History:  Procedure Laterality Date  . NO PAST SURGERIES      Home Medications:  Current Facility-Administered Medications  Medication Dose Route Frequency Provider Last Rate Last Admin  . 0.9 %  sodium chloride infusion   Intravenous Continuous Josedejesus Marcum, Candee Furbish, MD      . acetaminophen (TYLENOL) tablet 650 mg  650 mg Oral Q4H PRN Bettye Sitton, Candee Furbish, MD      . diphenhydrAMINE (BENADRYL) injection 12.5-25 mg  12.5-25 mg Intravenous Q6H PRN Evin Chirco, Candee Furbish, MD       Or  . diphenhydrAMINE (BENADRYL) 12.5 MG/5ML elixir 12.5-25 mg  12.5-25 mg Oral Q6H PRN Wrenna Saks, Candee Furbish, MD      . HYDROmorphone (DILAUDID) injection 0.5-1 mg  0.5-1 mg Intravenous Q2H PRN Dywane Peruski, Candee Furbish, MD      . oxyCODONE-acetaminophen (PERCOCET/ROXICET) 5-325 MG per tablet 1-2 tablet  1-2 tablet Oral Q4H PRN Kayla Deshaies, Candee Furbish, MD      . promethazine (PHENERGAN) injection 12.5-25 mg  12.5-25 mg Intravenous Q6H PRN Cleon Gustin, MD      . zolpidem (AMBIEN) tablet 5 mg  5 mg Oral QHS PRN Alyson Ingles Candee Furbish, MD        Current Outpatient Medications  Medication Sig Dispense Refill Last Dose  . ibuprofen (ADVIL) 200 MG tablet Take 600 mg by mouth every 6 (six) hours as needed (for pain).   06/13/2019 at Unknown time  . omeprazole (PRILOSEC) 40 MG capsule Take 40 mg by mouth daily.   unk at [employer]  . oxyCODONE-acetaminophen (PERCOCET/ROXICET) 5-325 MG tablet Take 1 tablet by mouth every 8 (eight) hours as needed for moderate pain or severe pain.   06/13/2019 at Unknown time  . tamsulosin (FLOMAX) 0.4 MG CAPS capsule Take 0.4 mg by mouth daily.    06/13/2019 at am  . amLODipine (NORVASC) 5 MG tablet Take 1 tablet (5 mg total) by mouth daily. (Patient not taking: Reported on 06/13/2019) 30 tablet 0 Not Taking at Unknown time  . amoxicillin-clavulanate (AUGMENTIN) 875-125 MG tablet Take 1 tablet by mouth 2 (two) times daily. (Patient not taking: Reported on 06/13/2019) 20 tablet 0 Not Taking at Unknown time  . ibuprofen (ADVIL,MOTRIN) 800 MG tablet Take 1 tablet (800 mg total) by mouth 3 (three) times daily. Take with food (Patient not taking: Reported on 06/13/2019) 21 tablet 0 Not Taking at Unknown time  . nicotine (NICODERM CQ - DOSED IN MG/24 HOURS) 14 mg/24hr patch Place 1 patch (14 mg total) onto the skin daily. (Patient not taking: Reported on 06/13/2019) 28 patch 0 Not Taking at Unknown time  Allergies:  Allergies  Allergen Reactions  . Codeine Hives and Shortness Of Breath    Family History  Problem Relation Age of Onset  . Heart disease Paternal Grandmother   . Cancer Paternal Grandmother        breast  . Thyroid disease Father   . Cancer Father        lung  . Hodgkin's lymphoma Father   . Sudden Cardiac Death Father        Was not sure if was an MI or not.  Was during cancer treatment.  No autopsy done  . Kidney disease Mother        GN3N  . Other Maternal Grandmother        Guillian Barre syndrome   Social History:  reports that she has been smoking cigarettes. She has been smoking about 0.50  packs per day. She has never used smokeless tobacco. She reports that she does not drink alcohol or use drugs.  Review of Systems  Gastrointestinal: Positive for nausea and vomiting.  Genitourinary: Positive for flank pain.  All other systems reviewed and are negative.   Physical Exam:  Vital signs in last 24 hours: Temp:  [98 F (36.7 C)-99.1 F (37.3 C)] 98 F (36.7 C) (02/18 1840) Pulse Rate:  [99-104] 99 (02/18 2221) Resp:  [20-22] 20 (02/18 2221) BP: (164-191)/(87-127) 182/120 (02/18 2221) SpO2:  [98 %-100 %] 99 % (02/18 2221) Physical Exam  Constitutional: She is oriented to person, place, and time. She appears well-developed and well-nourished.  HENT:  Head: Normocephalic and atraumatic.  Eyes: Pupils are equal, round, and reactive to light. EOM are normal.  Neck: No thyromegaly present.  Cardiovascular: Normal rate and regular rhythm.  Respiratory: Effort normal. No respiratory distress.  GI: Soft. She exhibits no distension.  Musculoskeletal:        General: No edema. Normal range of motion.     Cervical back: Normal range of motion.  Neurological: She is alert and oriented to person, place, and time.  Skin: Skin is warm and dry.  Psychiatric: She has a normal mood and affect. Her behavior is normal. Judgment and thought content normal.    Laboratory Data:  Results for orders placed or performed during the hospital encounter of 06/13/19 (from the past 24 hour(s))  Basic metabolic panel     Status: Abnormal   Collection Time: 06/13/19  3:26 PM  Result Value Ref Range   Sodium 140 135 - 145 mmol/L   Potassium 3.7 3.5 - 5.1 mmol/L   Chloride 107 98 - 111 mmol/L   CO2 24 22 - 32 mmol/L   Glucose, Bld 102 (H) 70 - 99 mg/dL   BUN 7 6 - 20 mg/dL   Creatinine, Ser 7.51 0.44 - 1.00 mg/dL   Calcium 9.1 8.9 - 02.5 mg/dL   GFR calc non Af Amer >60 >60 mL/min   GFR calc Af Amer >60 >60 mL/min   Anion gap 9 5 - 15  CBC     Status: Abnormal   Collection Time: 06/13/19   3:26 PM  Result Value Ref Range   WBC 10.9 (H) 4.0 - 10.5 K/uL   RBC 4.87 3.87 - 5.11 MIL/uL   Hemoglobin 15.0 12.0 - 15.0 g/dL   HCT 85.2 77.8 - 24.2 %   MCV 89.5 80.0 - 100.0 fL   MCH 30.8 26.0 - 34.0 pg   MCHC 34.4 30.0 - 36.0 g/dL   RDW 35.3 61.4 - 43.1 %  Platelets 284 150 - 400 K/uL   nRBC 0.0 0.0 - 0.2 %  I-Stat beta hCG blood, ED     Status: None   Collection Time: 06/13/19  4:08 PM  Result Value Ref Range   I-stat hCG, quantitative <5.0 <5 mIU/mL   Comment 3          Urinalysis, Routine w reflex microscopic- may I&O cath if menses     Status: Abnormal   Collection Time: 06/13/19  5:00 PM  Result Value Ref Range   Color, Urine YELLOW YELLOW   APPearance CLEAR CLEAR   Specific Gravity, Urine 1.013 1.005 - 1.030   pH 6.0 5.0 - 8.0   Glucose, UA NEGATIVE NEGATIVE mg/dL   Hgb urine dipstick LARGE (A) NEGATIVE   Bilirubin Urine NEGATIVE NEGATIVE   Ketones, ur NEGATIVE NEGATIVE mg/dL   Protein, ur NEGATIVE NEGATIVE mg/dL   Nitrite NEGATIVE NEGATIVE   Leukocytes,Ua NEGATIVE NEGATIVE   RBC / HPF 11-20 0 - 5 RBC/hpf   WBC, UA 0-5 0 - 5 WBC/hpf   Bacteria, UA RARE (A) NONE SEEN   Squamous Epithelial / LPF 0-5 0 - 5   Mucus PRESENT    No results found for this or any previous visit (from the past 240 hour(s)). Creatinine: Recent Labs    06/13/19 1526  CREATININE 0.83   Baseline Creatinine: 0.8  Impression/Assessment:  33yo with right ureteral calculus, intractable pain  Plan:  1. The patient will be admitted for pain and nausea/vomiting. She will be scheduled for right ureteral stent placement  Osha Rane 06/13/2019, 10:35 PM      

## 2019-06-13 NOTE — ED Provider Notes (Signed)
[facility] EMERGENCY DEPARTMENT Provider Note   CSN: [id number] Arrival date & time: 06/13/19  1519     History Chief Complaint  Patient presents with  . Flank Pain    Bianca [name] is a 34 y.o. female.  34 year old female with past medical history of kidney stones presents with complaint of right flank pain, radiates to right abdomen, was intermittent however is now constant..  Patient states that she had an obstructing right UVJ stone and was supposed to have a ureteral stent placed at [facility] on December 31 however her Covid test was positive and the procedure was delayed.  Patient states that she did feel better after that time however feels like her pain has come back over the past week and is significantly worse today.  Patient reports severe nausea, denies fevers, chills, dysuria or hematuria, changes in bowel habits or abnormal vaginal discharge.  Patient does not have a urologist currently, reports stones in the past requiring stents.  No prior abdominal surgeries.  No other complaints or concerns today.        Past Medical History:  Diagnosis Date  . History of recurrent UTIs   . Hypertension   . Kidney stones     Patient Active Problem List   Diagnosis Date Noted  . Essential hypertension 07/30/2018  . Cellulitis 07/28/2018  . Cat bite of finger 07/27/2018 07/28/2018  . Heart palpitations 05/26/2017  . Chest discomfort 05/26/2017  . Dyspareunia, female 09/06/2016  . Dysmenorrhea 09/06/2016  . Menorrhagia with regular cycle 09/06/2016  . Postpartum hypertension, resolved 08/18/2014  . NSVD (normal spontaneous vaginal delivery) 05/30/2014  . Preeclampsia, severe 05/29/2014  . Right nephrolithiasis 03/17/2014  . Hydronephrosis determined by ultrasound 03/16/2014  . Hydronephrosis of right kidney 03/15/2014  . Benign essential hypertension antepartum 10/22/2013  . Smoker 10/22/2013  . History of gestational hypertension 10/22/2013    Past Surgical  History:  Procedure Laterality Date  . NO PAST SURGERIES       OB History    Gravida  3   Para  3   Term  2   Preterm  1   AB      Living  3     SAB      TAB      Ectopic      Multiple  0   Live Births  3           Family History  Problem Relation Age of Onset  . Heart disease Paternal Grandmother   . Cancer Paternal Grandmother        breast  . Thyroid disease Father   . Cancer Father        lung  . Hodgkin's lymphoma Father   . Sudden Cardiac Death Father        Was not sure if was an MI or not.  Was during cancer treatment.  No autopsy done  . Kidney disease Mother        GN3N  . Other Maternal Grandmother        Guillian Barre syndrome    Social History   Tobacco Use  . Smoking status: Current Every Day Smoker    Packs/day: 0.50    Types: Cigarettes    Last attempt to quit: 03/25/2014    Years since quitting: 5.2  . Smokeless tobacco: Never Used  Substance Use Topics  . Alcohol use: No  . Drug use: No    Home Medications Prior to Admission medications  Medication Sig Start Date End Date Taking? Authorizing Provider  omeprazole (PRILOSEC) 40 MG capsule Take 40 mg by mouth daily.   Yes [provider]  oxyCODONE-acetaminophen (PERCOCET/ROXICET) 5-325 MG tablet Take 1 tablet by mouth every 8 (eight) hours as needed for moderate pain or severe pain.   Yes [provider]  tamsulosin (FLOMAX) 0.4 MG CAPS capsule Take 0.4 mg by mouth daily as needed.   Yes [provider]  acetaminophen (TYLENOL) 500 MG tablet Take 1,000 mg by mouth every 6 (six) hours as needed for moderate pain.    [provider]  amLODipine (NORVASC) 5 MG tablet Take 1 tablet (5 mg total) by mouth daily. 07/31/18   Kathie Dike, MD  amoxicillin-clavulanate (AUGMENTIN) 875-125 MG tablet Take 1 tablet by mouth 2 (two) times daily. Patient not taking: Reported on 06/13/2019 07/30/18   Kathie Dike, MD  ibuprofen (ADVIL,MOTRIN) 800 MG tablet  Take 1 tablet (800 mg total) by mouth 3 (three) times daily. Take with food 06/09/18   Triplett, Tammy, PA-C  nicotine (NICODERM CQ - DOSED IN MG/24 HOURS) 14 mg/24hr patch Place 1 patch (14 mg total) onto the skin daily. Patient not taking: Reported on 06/13/2019 07/31/18   Kathie Dike, MD    Allergies    Codeine  Review of Systems   Review of Systems  Constitutional: Negative for chills, diaphoresis and fever.  Gastrointestinal: Positive for abdominal pain and nausea. Negative for constipation, diarrhea and vomiting.  Genitourinary: Positive for difficulty urinating. Negative for dysuria, hematuria and vaginal discharge.  Skin: Negative for rash and wound.  Allergic/Immunologic: Negative for immunocompromised state.  Neurological: Negative for weakness.  Psychiatric/Behavioral: Negative for confusion.  All other systems reviewed and are negative.   Physical Exam Updated Vital Signs BP (!) 164/87   Pulse (!) 101   Temp 98 F (36.7 C) (Oral)   Resp (!) 22   SpO2 98%   Physical Exam Vitals and nursing note reviewed.  Constitutional:      General: She is not in acute distress.    Appearance: She is well-developed. She is not diaphoretic.     Comments: Tearful, appears uncomfortable.  HENT:     Head: Normocephalic and atraumatic.  Cardiovascular:     Rate and Rhythm: Regular rhythm. Tachycardia present.     Pulses: Normal pulses.     Heart sounds: Normal heart sounds.  Pulmonary:     Effort: Pulmonary effort is normal.     Breath sounds: Normal breath sounds.  Abdominal:     Palpations: Abdomen is soft.     Tenderness: There is abdominal tenderness in the right upper quadrant and right lower quadrant. There is right CVA tenderness. There is no left CVA tenderness.  Musculoskeletal:     Right lower leg: No edema.     Left lower leg: No edema.  Skin:    General: Skin is warm and dry.     Findings: No erythema or rash.  Neurological:     Mental Status: She is alert and  oriented to person, place, and time.  Psychiatric:        Behavior: Behavior normal.     ED Results / Procedures / Treatments   Labs (all labs ordered are listed, but only abnormal results are displayed) Labs Reviewed  URINALYSIS, ROUTINE W REFLEX MICROSCOPIC - Abnormal; Notable for the following components:      Result Value   Hgb urine dipstick LARGE (*)    Bacteria, UA RARE (*)  All other components within normal limits  BASIC METABOLIC PANEL - Abnormal; Notable for the following components:   Glucose, Bld 102 (*)    All other components within normal limits  CBC - Abnormal; Notable for the following components:   WBC 10.9 (*)    All other components within normal limits  I-STAT BETA HCG BLOOD, ED ([facility], [facility], [facility] ONLY)    EKG None  Radiology CT Renal Stone Study  Result Date: 06/13/2019 CLINICAL DATA:  Bilateral flank pain. EXAM: CT ABDOMEN AND PELVIS WITHOUT CONTRAST TECHNIQUE: Multidetector CT imaging of the abdomen and pelvis was performed following the standard protocol without IV contrast. COMPARISON:  None. FINDINGS: LOWER CHEST: Normal. HEPATOBILIARY: Normal hepatic contours. No intra- or extrahepatic biliary dilatation. Normal gallbladder. PANCREAS: Normal pancreas. No ductal dilatation or peripancreatic fluid collection. SPLEEN: Normal. ADRENALS/URINARY TRACT: The adrenal glands are normal. Severe right hydroureteronephrosis with 7 mm obstructing stone in the distal right ureter, just proximal to the ureterovesical junction. No left hydroureteronephrosis. There is a 2 mm nonobstructing lower pole left renal calculus. The urinary bladder is normal for degree of distention STOMACH/BOWEL: There is no hiatal hernia. Normal duodenal course and caliber. No small bowel dilatation or inflammation. No focal colonic abnormality. Normal appendix. VASCULAR/LYMPHATIC: Normal course and caliber of the major abdominal vessels. No abdominal or pelvic lymphadenopathy. REPRODUCTIVE: Normal  uterus and ovaries. MUSCULOSKELETAL. No bony spinal canal stenosis or focal osseous abnormality. OTHER: None. IMPRESSION: 1. Severe right hydroureteronephrosis with 7 mm obstructing stone in the distal right ureter, just proximal to the ureterovesical junction. 2. Nonobstructing 2 mm lower pole left renal calculus. Electronically Signed   By: Deatra Robinson M.D.   On: 06/13/2019 19:02    Procedures Procedures (including critical care time)  Medications Ordered in ED Medications  sodium chloride 0.9 % bolus 500 mL (0 mLs Intravenous Stopped 06/13/19 1814)  HYDROmorphone (DILAUDID) injection 1 mg (1 mg Intravenous Given 06/13/19 1704)  ondansetron (ZOFRAN) injection 4 mg (4 mg Intravenous Given 06/13/19 1703)  amLODipine (NORVASC) tablet 5 mg (5 mg Oral Given 06/13/19 1858)  HYDROmorphone (DILAUDID) injection 1 mg (1 mg Intravenous Given 06/13/19 1858)  ketorolac (TORADOL) 30 MG/ML injection 30 mg (30 mg Intravenous Given 06/13/19 2000)    ED Course  I have reviewed the triage vital signs and the nursing notes.  Pertinent labs & imaging results that were available during my care of the patient were reviewed by me and considered in my medical decision making (see chart for details).  Clinical Course as of Jun 12 2150  Thu Jun 13, 2019  [id number] 34 year old female with history of distal right ureteral stone, seen at [facility] on December 31 with plan for stent however due to positive Covid test was unable to get a stent and was discharged home.  Patient was feeling better until this week, pain returned and is significantly worse today.  On exam patient is tearful, has right-sided CVA tenderness with right side abdominal pain.  Blood pressure is elevated, states that she has not been feeling well and has not been taking her blood pressure medication.  Patient was given her blood pressure medication as well as 2 doses of Dilaudid without improvement in her pain.  CT returns with 7 mm distal right ureteral stone with  significant right-sided hydro.  Case discussed with Dr. Ronne Binning on-call with urology, recommends Toradol for better pain control, if pain is not controlled patient will need transfer to [facility], if pain is controlled patient may follow-up  in the office tomorrow. Discussed plan of care with patient, patient is in significant pain at this time, will try Toradol and reassess with above plan.   [LM]  2030 Blood pressure has improved. Patient remains in significant pain post toradol. Will continue to monitor.    [LM]  2112 Patient remains tearful, still in pain. Will contact urology.    [LM]  2138 Patient continues to moan in pain, rolling on the bed, discussed with Dr. Ronne Binning, will send to [facility] for admission. Discussed with Dr. Charm Barges, attending at [facility] who will call Dr. Ronne Binning when patient arrives.    [LM]    Clinical Course User Index [LM] Alden Hipp   MDM Rules/Calculators/A&P                      Final Clinical Impression(s) / ED Diagnoses Final diagnoses:  Ureterolithiasis  Hydroureteronephrosis  Hypertension, unspecified type    Rx / DC Orders ED Discharge Orders    None       Alden Hipp 06/13/19 2152    Terald Sleeper, MD 06/14/19 [phone]

## 2019-06-13 NOTE — ED Provider Notes (Signed)
Patient transferred from [facility] for urology evaluation.  Please see prior H&P from Army Melia, PA.  Briefly history of kidney stones here with severe right flank pain.  This has been going on for a couple of months but acutely worsened over the past day or so.  Associated with nausea and vomiting.  CT showing 7 mm distal stone.  Lab work otherwise unremarkable.  Patient rates the pain is severe.  Have paged urology for recommendations.  Urology is planning to take to OR for procedure. Patient remains NPO. IV pain and nausea medication ordered.    Terrilee Files, MD 06/14/19 1030

## 2019-06-13 NOTE — Discharge Instructions (Addendum)

## 2019-06-13 NOTE — ED Triage Notes (Addendum)
Pt states she has had right and left flank pain since November. Pt has known kidney stones and was hospitalized in Dec for them. Pt was supposed to have surgery to remove them in Dec, but it was canceled due to her having Covid. She also complains of painful urination and nausea. Pt states she took a percocet before coming.

## 2019-06-14 ENCOUNTER — Encounter (HOSPITAL_COMMUNITY)
Admission: EM | Disposition: A | Discharge status: Home / Self Care | Payer: Self-pay | Source: Home / Self Care | Attending: Emergency Medicine

## 2019-06-14 ENCOUNTER — Encounter (HOSPITAL_COMMUNITY): Payer: Self-pay | Admitting: Urology

## 2019-06-14 ENCOUNTER — Observation Stay (HOSPITAL_COMMUNITY): Payer: Managed Care, Other (non HMO) | Admitting: Anesthesiology

## 2019-06-14 ENCOUNTER — Observation Stay (HOSPITAL_COMMUNITY): Payer: Managed Care, Other (non HMO)

## 2019-06-14 HISTORY — PX: CYSTOSCOPY WITH STENT PLACEMENT: SHX5790

## 2019-06-14 LAB — SURGICAL PCR SCREEN
MRSA, PCR: NEGATIVE
Staphylococcus aureus: NEGATIVE

## 2019-06-14 SURGERY — CYSTOSCOPY, WITH STENT INSERTION
Anesthesia: General | Laterality: Right

## 2019-06-14 MED ORDER — ONDANSETRON HCL 4 MG/2ML IJ SOLN
INTRAMUSCULAR | Status: DC | PRN
  Administered 2019-06-14: 4 mg via INTRAVENOUS

## 2019-06-14 MED ORDER — SUCCINYLCHOLINE CHLORIDE 200 MG/10ML IV SOSY
PREFILLED_SYRINGE | INTRAVENOUS | Status: AC
  Filled 2019-06-14: qty 10

## 2019-06-14 MED ORDER — DEXAMETHASONE SODIUM PHOSPHATE 10 MG/ML IJ SOLN
INTRAMUSCULAR | Status: AC
  Filled 2019-06-14: qty 1

## 2019-06-14 MED ORDER — SULFAMETHOXAZOLE-TRIMETHOPRIM 800-160 MG PO TABS: 1.0000 | ORAL_TABLET | Freq: Two times a day (BID) | ORAL | 0 refills | Status: DC

## 2019-06-14 MED ORDER — PROMETHAZINE HCL 25 MG/ML IJ SOLN: 6.2500 mg | INTRAMUSCULAR | Status: DC | PRN

## 2019-06-14 MED ORDER — HYDROMORPHONE HCL 1 MG/ML IJ SOLN: 0.2500 mg | INTRAMUSCULAR | Status: DC | PRN

## 2019-06-14 MED ORDER — CEFAZOLIN SODIUM-DEXTROSE 2-4 GM/100ML-% IV SOLN: 2.0000 g | Freq: Once | INTRAVENOUS | Status: DC

## 2019-06-14 MED ORDER — LACTATED RINGERS IV SOLN: INTRAVENOUS | Status: DC

## 2019-06-14 MED ORDER — DEXAMETHASONE SODIUM PHOSPHATE 10 MG/ML IJ SOLN
INTRAMUSCULAR | Status: DC | PRN
  Administered 2019-06-14: 8 mg via INTRAVENOUS

## 2019-06-14 MED ORDER — LIDOCAINE 2% (20 MG/ML) 5 ML SYRINGE
INTRAMUSCULAR | Status: AC
  Filled 2019-06-14: qty 5

## 2019-06-14 MED ORDER — PROPOFOL 10 MG/ML IV BOLUS
INTRAVENOUS | Status: AC
  Filled 2019-06-14: qty 20

## 2019-06-14 MED ORDER — ONDANSETRON HCL 4 MG/2ML IJ SOLN
INTRAMUSCULAR | Status: AC
  Filled 2019-06-14: qty 2

## 2019-06-14 MED ORDER — IOHEXOL 300 MG/ML  SOLN
INTRAMUSCULAR | Status: DC | PRN
  Administered 2019-06-14: 8 mL

## 2019-06-14 MED ORDER — LIDOCAINE HCL (CARDIAC) PF 100 MG/5ML IV SOSY
PREFILLED_SYRINGE | INTRAVENOUS | Status: DC | PRN
  Administered 2019-06-14: 60 mg via INTRAVENOUS

## 2019-06-14 MED ORDER — 0.9 % SODIUM CHLORIDE (POUR BTL) OPTIME
TOPICAL | Status: DC | PRN
  Administered 2019-06-14: 1000 mL

## 2019-06-14 MED ORDER — KETOROLAC TROMETHAMINE 30 MG/ML IJ SOLN
INTRAMUSCULAR | Status: AC
  Filled 2019-06-14: qty 1

## 2019-06-14 MED ORDER — SUCCINYLCHOLINE CHLORIDE 20 MG/ML IJ SOLN
INTRAMUSCULAR | Status: DC | PRN
  Administered 2019-06-14: 80 mg via INTRAVENOUS

## 2019-06-14 MED ORDER — FENTANYL CITRATE (PF) 100 MCG/2ML IJ SOLN
INTRAMUSCULAR | Status: AC
  Filled 2019-06-14: qty 2

## 2019-06-14 MED ORDER — MIDAZOLAM HCL 2 MG/2ML IJ SOLN
INTRAMUSCULAR | Status: AC
  Filled 2019-06-14: qty 2

## 2019-06-14 MED ORDER — CEFAZOLIN SODIUM-DEXTROSE 2-4 GM/100ML-% IV SOLN
INTRAVENOUS | Status: AC
  Filled 2019-06-14: qty 100

## 2019-06-14 MED ORDER — SODIUM CHLORIDE 0.9 % IR SOLN
Status: DC | PRN
  Administered 2019-06-14: 3000 mL

## 2019-06-14 MED ORDER — OXYCODONE-ACETAMINOPHEN 5-325 MG PO TABS: 1.0000 | ORAL_TABLET | ORAL | 0 refills | Status: DC | PRN

## 2019-06-14 MED ORDER — MUPIROCIN 2 % EX OINT
1.0000 "application " | TOPICAL_OINTMENT | Freq: Two times a day (BID) | CUTANEOUS | Status: DC
  Filled 2019-06-14: qty 22

## 2019-06-14 MED ORDER — HYDRALAZINE HCL 20 MG/ML IJ SOLN
INTRAMUSCULAR | Status: AC
  Filled 2019-06-14: qty 1

## 2019-06-14 MED ORDER — CEFAZOLIN SODIUM-DEXTROSE 2-4 GM/100ML-% IV SOLN
2.0000 g | Freq: Once | INTRAVENOUS | Status: DC
  Administered 2019-06-14: 2 g via INTRAVENOUS

## 2019-06-14 MED ORDER — ACETAMINOPHEN 10 MG/ML IV SOLN: 1000.0000 mg | Freq: Once | INTRAVENOUS | Status: DC | PRN

## 2019-06-14 MED ORDER — HYDRALAZINE HCL 20 MG/ML IJ SOLN
5.0000 mg | Freq: Once | INTRAMUSCULAR | Status: AC
  Administered 2019-06-14: 5 mg via INTRAVENOUS

## 2019-06-14 MED ORDER — MIDAZOLAM HCL 5 MG/5ML IJ SOLN
INTRAMUSCULAR | Status: DC | PRN
  Administered 2019-06-14: 1 mg via INTRAVENOUS

## 2019-06-14 MED ORDER — FENTANYL CITRATE (PF) 100 MCG/2ML IJ SOLN
INTRAMUSCULAR | Status: DC | PRN
  Administered 2019-06-14 (×2): 50 ug via INTRAVENOUS

## 2019-06-14 MED ORDER — KETOROLAC TROMETHAMINE 30 MG/ML IJ SOLN
30.0000 mg | Freq: Once | INTRAMUSCULAR | Status: AC
  Administered 2019-06-14: 30 mg via INTRAVENOUS

## 2019-06-14 MED ORDER — PROPOFOL 10 MG/ML IV BOLUS
INTRAVENOUS | Status: DC | PRN
  Administered 2019-06-14: 200 mg via INTRAVENOUS

## 2019-06-14 SURGICAL SUPPLY — 12 items
BAG URO CATCHER STRL LF (MISCELLANEOUS) ×3 IMPLANT
CATH INTERMIT  6FR 70CM (CATHETERS) ×3 IMPLANT
CLOTH BEACON ORANGE TIMEOUT ST (SAFETY) ×3 IMPLANT
GLOVE BIOGEL M 8.0 STRL (GLOVE) ×3 IMPLANT
GOWN STRL REUS W/TWL XL LVL3 (GOWN DISPOSABLE) ×6 IMPLANT
GUIDEWIRE STR DUAL SENSOR (WIRE) ×3 IMPLANT
KIT TURNOVER KIT A (KITS) IMPLANT
MANIFOLD NEPTUNE II (INSTRUMENTS) ×3 IMPLANT
PACK CYSTO (CUSTOM PROCEDURE TRAY) ×3 IMPLANT
STENT URET 6FRX24 CONTOUR (STENTS) ×3 IMPLANT
TUBING CONNECTING 10 (TUBING) ×2 IMPLANT
TUBING CONNECTING 10' (TUBING) ×1

## 2019-06-14 NOTE — Anesthesia Preprocedure Evaluation (Addendum)
Anesthesia Evaluation  Patient identified by MRN, date of birth, ID band Patient awake    Reviewed: Allergy & Precautions, NPO status , Patient's Chart, lab work & pertinent test results  Airway Mallampati: III  TM Distance: >3 FB Neck ROM: Full    Dental  (+) Chipped, Poor Dentition,    Pulmonary Current Smoker and Patient abstained from smoking.,    Pulmonary exam normal breath sounds clear to auscultation       Cardiovascular hypertension, Pt. on medications Normal cardiovascular exam Rhythm:Regular Rate:Normal     Neuro/Psych negative neurological ROS  negative psych ROS   GI/Hepatic Neg liver ROS, GERD  Medicated and Controlled,  Endo/Other  negative endocrine ROS  Renal/GU Renal disease     Musculoskeletal negative musculoskeletal ROS (+)   Abdominal   Peds  Hematology negative hematology ROS (+)   Anesthesia Other Findings Right Ureteral Stone  Reproductive/Obstetrics hcg negative                            Anesthesia Physical Anesthesia Plan  ASA: II  Anesthesia Plan: General   Post-op Pain Management:    Induction: Intravenous and Rapid sequence  PONV Risk Score and Plan: 2  Airway Management Planned: Oral ETT  Additional Equipment:   Intra-op Plan:   Post-operative Plan: Extubation in OR  Informed Consent: I have reviewed the patients History and Physical, chart, labs and discussed the procedure including the risks, benefits and alternatives for the proposed anesthesia with the patient or authorized representative who has indicated his/her understanding and acceptance.     Dental advisory given  Plan Discussed with: CRNA  Anesthesia Plan Comments:        Anesthesia Quick Evaluation

## 2019-06-14 NOTE — Anesthesia Postprocedure Evaluation (Signed)
Anesthesia Post Note  Patient: Bianca [name]  Procedure(s) Performed: Cystoscopy, Right Retrograde Pyelogram, Right Stent Placement (Right )     Patient location during evaluation: PACU Anesthesia Type: General Level of consciousness: awake and alert Pain management: pain level controlled Vital Signs Assessment: post-procedure vital signs reviewed and stable Respiratory status: spontaneous breathing, nonlabored ventilation, respiratory function stable and patient connected to nasal cannula oxygen Cardiovascular status: blood pressure returned to baseline and stable Postop Assessment: no apparent nausea or vomiting Anesthetic complications: no    Last Vitals:  Vitals:   06/14/19 1127 06/14/19 1259  BP: (!) 142/82 136/82  Pulse: 82 94  Resp: 18 16  Temp: 36.7 C 36.4 C  SpO2: 99% 98%    Last Pain:  Vitals:   06/14/19 1259  TempSrc: Oral  PainSc:                  Bianca [name] P Bianca [name]

## 2019-06-14 NOTE — Op Note (Signed)
.  Preoperative diagnosis: right distal ureteral stone, intractable pain  Postoperative diagnosis: Same  Procedure: 1 cystoscopy 2. right retrograde pyelography 3.  Intraoperative fluoroscopy, under one hour, with interpretation 4. right 6 x 26 JJ stent placement  Attending: Wilkie Aye  Anesthesia: General  Estimated blood loss: None  Drains: Right 6 x 2 JJ ureteral stent without tether Specimens: none  Antibiotics: ancef  Findings:right distal ureteral stone. severe hydronephrosis. No masses/lesions in the bladder. Ureteral orifices in normal anatomic location.  Indications: Patient is a 34 year old female with a history of right ureteral calculus and intractable pain  After discussing treatment options, they decided proceed with right stent placement.  Procedure her in detail: The patient was brought to the operating room and a brief timeout was done to ensure correct patient, correct procedure, correct site.  General anesthesia was administered patient was placed in dorsal lithotomy position.  Their genitalia was then prepped and draped in usual sterile fashion.  A rigid 22 French cystoscope was passed in the urethra and the bladder.  Bladder was inspected free masses or lesions.  the ureteral orifices were in the normal orthotopic locations.  a 6 french ureteral catheter was then instilled into the right ureteral orifice.  a gentle retrograde was obtained and findings noted above.  we then placed a zip wire through the ureteral catheter and advanced up to the renal pelvis.    We then placed a 6 x 26 double-j ureteral stent over the original zip wire.  We then removed the wire and good coil was noted in the the renal pelvis under fluoroscopy and the bladder under direct vision.  A foley catheter was then placed. the bladder was then drained and this concluded the procedure which was well tolerated by patient.  Complications: None  Condition: Stable, extubated, transferred to  PACU  Plan: Patient is to be admitted for IV antibiotics. she will have his stone extraction in 2 weeks.

## 2019-06-14 NOTE — Anesthesia Procedure Notes (Signed)
Procedure Name: Intubation Date/Time: 06/14/2019 7:42 AM Performed by: Thornell Mule, CRNA Pre-anesthesia Checklist: Patient identified, Emergency Drugs available, Suction available and Patient being monitored Patient Re-evaluated:Patient Re-evaluated prior to induction Oxygen Delivery Method: Circle system utilized Preoxygenation: Pre-oxygenation with 100% oxygen Induction Type: IV induction Ventilation: Mask ventilation without difficulty Laryngoscope Size: Miller and 3 Grade View: Grade I Tube type: Oral Tube size: 7.0 mm Number of attempts: 1 Airway Equipment and Method: Stylet and Oral airway Placement Confirmation: ETT inserted through vocal cords under direct vision,  positive ETCO2 and breath sounds checked- equal and bilateral Secured at: 20 cm Tube secured with: Tape Dental Injury: Teeth and Oropharynx as per pre-operative assessment

## 2019-06-14 NOTE — Transfer of Care (Signed)
Immediate Anesthesia Transfer of Care Note  Patient: Bianca [name]  Procedure(s) Performed: Cystoscopy, Right Retrograde Pyelogram, Right Stent Placement (Right )  Patient Location: PACU  Anesthesia Type:General  Level of Consciousness: drowsy, patient cooperative and responds to stimulation  Airway & Oxygen Therapy: Patient Spontanous Breathing and Patient connected to face mask oxygen  Post-op Assessment: Report given to RN and Post -op Vital signs reviewed and stable  Post vital signs: Reviewed and stable  Last Vitals:  Vitals Value Taken Time  BP 145/97 06/14/19 0807  Temp    Pulse 99 06/14/19 0810  Resp 16 06/14/19 0810  SpO2 100 % 06/14/19 0810  Vitals shown include unvalidated device data.  Last Pain:  Vitals:   06/14/19 0700  TempSrc: Oral  PainSc:       Patients Stated Pain Goal: 1 (06/14/19 0014)  Complications: No apparent anesthesia complications

## 2019-06-21 NOTE — Discharge Summary (Signed)
Physician Discharge Summary  Patient ID: Bianca [name] MRN: [id number] DOB/AGE: [birth year] 34 y.o.  Admit date: 06/13/2019 Discharge date: 06/14/2019  Admission Diagnoses:  Ureteral calculus  Discharge Diagnoses:  Active Problems:   Ureteral calculus   Past Medical History:  Diagnosis Date  . History of recurrent UTIs   . Hypertension   . Kidney stones     Surgeries: Procedure(s): Cystoscopy, Right Retrograde Pyelogram, Right Stent Placement on 06/14/2019   Consultants (if any):   Discharged Condition: Improved  Hospital Course: Bianca [name] is an 34 y.o. female who was admitted 06/13/2019 with a diagnosis of ureteral calculus and went to the operating room on 06/14/2019 and underwent the above named procedures.  She did well post op and pain was well controlled  She was given perioperative antibiotics:  Anti-infectives (From admission, onward)   Start     Dose/Rate Route Frequency Ordered Stop   06/14/19 0730  ceFAZolin (ANCEF) IVPB 2g/100 mL premix  Status:  Discontinued     2 g 200 mL/hr over 30 Minutes Intravenous  Once 06/14/19 0716 06/14/19 0812   06/14/19 0730  ceFAZolin (ANCEF) IVPB 2g/100 mL premix  Status:  Discontinued     2 g 200 mL/hr over 30 Minutes Intravenous  Once 06/14/19 0717 06/14/19 0724   06/14/19 0717  ceFAZolin (ANCEF) 2-4 GM/100ML-% IVPB    Note to Pharmacy: Veva Holes: cabinet override      06/14/19 0717 06/14/19 0750   06/14/19 0000  sulfamethoxazole-trimethoprim (BACTRIM DS) 800-160 MG tablet     1 tablet Oral 2 times daily 06/14/19 1309      .  She was given sequential compression devices, early ambulation for DVT prophylaxis.  She benefited maximally from the hospital stay and there were no complications.    Recent vital signs:  Vitals:   06/14/19 1127 06/14/19 1259  BP: (!) 142/82 136/82  Pulse: 82 94  Resp: 18 16  Temp: 98 F (36.7 C) 97.6 F (36.4 C)  SpO2: 99% 98%    Recent laboratory studies:  Lab Results  Component  Value Date   HGB 15.0 06/13/2019   HGB 14.5 07/30/2018   HGB 15.4 (H) 07/28/2018   Lab Results  Component Value Date   WBC 10.9 (H) 06/13/2019   PLT 284 06/13/2019   No results found for: INR Lab Results  Component Value Date   NA 140 06/13/2019   K 3.7 06/13/2019   CL 107 06/13/2019   CO2 24 06/13/2019   BUN 7 06/13/2019   CREATININE 0.83 06/13/2019   GLUCOSE 102 (H) 06/13/2019    Discharge Medications:   Allergies as of 06/14/2019      Reactions   Codeine Hives, Shortness Of Breath   Tolerates hydromorphone      Medication List    STOP taking these medications   amoxicillin-clavulanate 875-125 MG tablet Commonly known as: AUGMENTIN     TAKE these medications   amLODipine 5 MG tablet Commonly known as: NORVASC Take 1 tablet (5 mg total) by mouth daily.   ibuprofen 200 MG tablet Commonly known as: ADVIL Take 600 mg by mouth every 6 (six) hours as needed (for pain).   ibuprofen 800 MG tablet Commonly known as: ADVIL Take 1 tablet (800 mg total) by mouth 3 (three) times daily. Take with food   nicotine 14 mg/24hr patch Commonly known as: NICODERM CQ - dosed in mg/24 hours Place 1 patch (14 mg total) onto the skin daily.   omeprazole 40 MG capsule Commonly  known as: PRILOSEC Take 40 mg by mouth daily.   oxyCODONE-acetaminophen 5-325 MG tablet Commonly known as: PERCOCET/ROXICET Take 1 tablet by mouth every 8 (eight) hours as needed for moderate pain or severe pain. What changed: Another medication with the same name was added. Make sure you understand how and when to take each.   oxyCODONE-acetaminophen 5-325 MG tablet Commonly known as: Percocet Take 1 tablet by mouth every 4 (four) hours as needed for moderate pain or severe pain. What changed: You were already taking a medication with the same name, and this prescription was added. Make sure you understand how and when to take each.   sulfamethoxazole-trimethoprim 800-160 MG tablet Commonly known as:  BACTRIM DS Take 1 tablet by mouth 2 (two) times daily.   tamsulosin 0.4 MG Caps capsule Commonly known as: FLOMAX Take 0.4 mg by mouth daily.       Diagnostic Studies: DG C-Arm 1-60 Min-No Report  Result Date: 06/14/2019 Fluoroscopy was utilized by the requesting physician.  No radiographic interpretation.   CT Renal Stone Study  Result Date: 06/13/2019 CLINICAL DATA:  Bilateral flank pain. EXAM: CT ABDOMEN AND PELVIS WITHOUT CONTRAST TECHNIQUE: Multidetector CT imaging of the abdomen and pelvis was performed following the standard protocol without IV contrast. COMPARISON:  None. FINDINGS: LOWER CHEST: Normal. HEPATOBILIARY: Normal hepatic contours. No intra- or extrahepatic biliary dilatation. Normal gallbladder. PANCREAS: Normal pancreas. No ductal dilatation or peripancreatic fluid collection. SPLEEN: Normal. ADRENALS/URINARY TRACT: The adrenal glands are normal. Severe right hydroureteronephrosis with 7 mm obstructing stone in the distal right ureter, just proximal to the ureterovesical junction. No left hydroureteronephrosis. There is a 2 mm nonobstructing lower pole left renal calculus. The urinary bladder is normal for degree of distention STOMACH/BOWEL: There is no hiatal hernia. Normal duodenal course and caliber. No small bowel dilatation or inflammation. No focal colonic abnormality. Normal appendix. VASCULAR/LYMPHATIC: Normal course and caliber of the major abdominal vessels. No abdominal or pelvic lymphadenopathy. REPRODUCTIVE: Normal uterus and ovaries. MUSCULOSKELETAL. No bony spinal canal stenosis or focal osseous abnormality. OTHER: None. IMPRESSION: 1. Severe right hydroureteronephrosis with 7 mm obstructing stone in the distal right ureter, just proximal to the ureterovesical junction. 2. Nonobstructing 2 mm lower pole left renal calculus. Electronically Signed   By: Ulyses Jarred M.D.   On: 06/13/2019 19:02    Disposition: Discharge disposition: 01-Home or Self  Care            Signed: Nicolette Bang 06/21/2019, 7:58 AM

## 2019-06-25 ENCOUNTER — Other Ambulatory Visit: Payer: Self-pay

## 2019-07-01 NOTE — Patient Instructions (Signed)
Bianca [name]  07/01/2019     @PREFPERIOPPHARMACY @   Your procedure is scheduled on  07/08/2019   Report to [facility] at  1015  A.M.  Call this number if you have problems the morning of surgery:  [phone]   Remember:  Do not eat or drink after midnight.                          Take these medicines the morning of surgery with A SIP OF WATER amlodipine, oxycoodne(if needed), prilosec.    Do not wear jewelry, make-up or nail polish.  Do not wear lotions, powders, or perfumes. Please wear deodorant and brush your teeth.  Do not shave 48 hours prior to surgery.  Men may shave face and neck.  Do not bring valuables to the hospital.  [facility] is not responsible for any belongings or valuables.  Contacts, dentures or bridgework may not be worn into surgery.  Leave your suitcase in the car.  After surgery it may be brought to your room.  For patients admitted to the hospital, discharge time will be determined by your treatment team.  Patients discharged the day of surgery will not be allowed to drive home.   Name and phone number of your driver:   family Special instructions:  DO NOT smoke the day of your surgery.  Please read over the following fact sheets that you were given. Anesthesia Post-op Instructions and Care and Recovery After Surgery       Ureteral Stent Implantation, Care After This sheet gives you information about how to care for yourself after your procedure. Your health care provider may also give you more specific instructions. If you have problems or questions, contact your health care provider. What can I expect after the procedure? After the procedure, it is common to have:  Nausea.  Mild pain when you urinate. You may feel this pain in your lower back or lower abdomen. The pain should stop within a few minutes after you urinate. This may last for up to 1 week.  A small amount of blood in your urine for several days. Follow these  instructions at home: Medicines  Take over-the-counter and prescription medicines only as told by your health care provider.  If you were prescribed an antibiotic medicine, take it as told by your health care provider. Do not stop taking the antibiotic even if you start to feel better.  Do not drive for 24 hours if you were given a sedative during your procedure.  Ask your health care provider if the medicine prescribed to you requires you to avoid driving or using heavy machinery. Activity  Rest as told by your health care provider.  Avoid sitting for a long time without moving. Get up to take short walks every 1-2 hours. This is important to improve blood flow and breathing. Ask for help if you feel weak or unsteady.  Return to your normal activities as told by your health care provider. Ask your health care provider what activities are safe for you. General instructions   Watch for any blood in your urine. Call your health care provider if the amount of blood in your urine increases.  If you have a catheter: ? Follow instructions from your health care provider about taking care of your catheter and collection bag. ? Do not take baths, swim, or use a hot tub until your health care  provider approves. Ask your health care provider if you may take showers. You may only be allowed to take sponge baths.  Drink enough fluid to keep your urine pale yellow.  Do not use any products that contain nicotine or tobacco, such as cigarettes, e-cigarettes, and chewing tobacco. These can delay healing after surgery. If you need help quitting, ask your health care provider.  Keep all follow-up visits as told by your health care provider. This is important. Contact a health care provider if:  You have pain that gets worse or does not get better with medicine, especially pain when you urinate.  You have difficulty urinating.  You feel nauseous or you vomit repeatedly during a period of more than 2  days after the procedure. Get help right away if:  Your urine is dark red or has blood clots in it.  You are leaking urine (have incontinence).  The end of the stent comes out of your urethra.  You cannot urinate.  You have sudden, sharp, or severe pain in your abdomen or lower back.  You have a fever.  You have swelling or pain in your legs.  You have difficulty breathing. Summary  After the procedure, it is common to have mild pain when you urinate that goes away within a few minutes after you urinate. This may last for up to 1 week.  Watch for any blood in your urine. Call your health care provider if the amount of blood in your urine increases.  Take over-the-counter and prescription medicines only as told by your health care provider.  Drink enough fluid to keep your urine pale yellow. This information is not intended to replace advice given to you by your health care provider. Make sure you discuss any questions you have with your health care provider. Document Revised: 01/16/2018 Document Reviewed: 01/17/2018 Elsevier Patient Education  2020 [employer].  Cystoscopy Cystoscopy is a procedure that is used to help diagnose and sometimes treat conditions that affect the lower urinary tract. The lower urinary tract includes the bladder and the urethra. The urethra is the tube that drains urine from the bladder. Cystoscopy is done using a thin, tube-shaped instrument with a light and camera at the end (cystoscope). The cystoscope may be hard or flexible, depending on the goal of the procedure. The cystoscope is inserted through the urethra, into the bladder. Cystoscopy may be recommended if you have:  Urinary tract infections that keep coming back.  Blood in the urine (hematuria).  An inability to control when you urinate (urinary incontinence) or an overactive bladder.  Unusual cells found in a urine sample.  A blockage in the urethra, such as a urinary  stone.  Painful urination.  An abnormality in the bladder found during an intravenous pyelogram (IVP) or CT scan. Cystoscopy may also be done to remove a sample of tissue to be examined under a microscope (biopsy). Tell a health care provider about:  Any allergies you have.  All medicines you are taking, including vitamins, herbs, eye drops, creams, and over-the-counter medicines.  Any problems you or family members have had with anesthetic medicines.  Any blood disorders you have.  Any surgeries you have had.  Any medical conditions you have.  Whether you are pregnant or may be pregnant. What are the risks? Generally, this is a safe procedure. However, problems may occur, including:  Infection.  Bleeding.  Allergic reactions to medicines.  Damage to other structures or organs. What happens before the procedure?  Ask your health care provider about: ? Changing or stopping your regular medicines. This is especially important if you are taking diabetes medicines or blood thinners. ? Taking medicines such as aspirin and ibuprofen. These medicines can thin your blood. Do not take these medicines unless your health care provider tells you to take them. ? Taking over-the-counter medicines, vitamins, herbs, and supplements.  Follow instructions from your health care provider about eating or drinking restrictions.  Ask your health care provider what steps will be taken to help prevent infection. These may include: ? Washing skin with a germ-killing soap. ? Taking antibiotic medicine.  You may have an exam or testing, such as: ? X-rays of the bladder, urethra, or kidneys. ? Urine tests to check for signs of infection.  Plan to have someone take you home from the hospital or clinic. What happens during the procedure?   You will be given one or more of the following: ? A medicine to help you relax (sedative). ? A medicine to numb the area (local anesthetic).  The area  around the opening of your urethra will be cleaned.  The cystoscope will be passed through your urethra into your bladder.  Germ-free (sterile) fluid will flow through the cystoscope to fill your bladder. The fluid will stretch your bladder so that your health care provider can clearly examine your bladder walls.  Your doctor will look at the urethra and bladder. Your doctor may take a biopsy or remove stones.  The cystoscope will be removed, and your bladder will be emptied. The procedure may vary among health care providers and hospitals. What can I expect after the procedure? After the procedure, it is common to have:  Some soreness or pain in your abdomen and urethra.  Urinary symptoms. These include: ? Mild pain or burning when you urinate. Pain should stop within a few minutes after you urinate. This may last for up to 1 week. ? A small amount of blood in your urine for several days. ? Feeling like you need to urinate but producing only a small amount of urine. Follow these instructions at home: Medicines  Take over-the-counter and prescription medicines only as told by your health care provider.  If you were prescribed an antibiotic medicine, take it as told by your health care provider. Do not stop taking the antibiotic even if you start to feel better. General instructions  Return to your normal activities as told by your health care provider. Ask your health care provider what activities are safe for you.  Do not drive for 24 hours if you were given a sedative during your procedure.  Watch for any blood in your urine. If the amount of blood in your urine increases, call your health care provider.  Follow instructions from your health care provider about eating or drinking restrictions.  If a tissue sample was removed for testing (biopsy) during your procedure, it is up to you to get your test results. Ask your health care provider, or the department that is doing the  test, when your results will be ready.  Drink enough fluid to keep your urine pale yellow.  Keep all follow-up visits as told by your health care provider. This is important. Contact a health care provider if you:  Have pain that gets worse or does not get better with medicine, especially pain when you urinate.  Have trouble urinating.  Have more blood in your urine. Get help right away if you:  Have blood clots  in your urine.  Have abdominal pain.  Have a fever or chills.  Are unable to urinate. Summary  Cystoscopy is a procedure that is used to help diagnose and sometimes treat conditions that affect the lower urinary tract.  Cystoscopy is done using a thin, tube-shaped instrument with a light and camera at the end.  After the procedure, it is common to have some soreness or pain in your abdomen and urethra.  Watch for any blood in your urine. If the amount of blood in your urine increases, call your health care provider.  If you were prescribed an antibiotic medicine, take it as told by your health care provider. Do not stop taking the antibiotic even if you start to feel better. This information is not intended to replace advice given to you by your health care provider. Make sure you discuss any questions you have with your health care provider. Document Revised: 04/03/2018 Document Reviewed: 04/03/2018 Elsevier Patient Education  2020 [employer] Monitored Anesthesia Care, Care After These instructions provide you with information about caring for yourself after your procedure. Your health care provider may also give you more specific instructions. Your treatment has been planned according to current medical practices, but problems sometimes occur. Call your health care provider if you have any problems or questions after your procedure. What can I expect after the procedure? After your procedure, you may:  Feel sleepy for several hours.  Feel clumsy and have poor  balance for several hours.  Feel forgetful about what happened after the procedure.  Have poor judgment for several hours.  Feel nauseous or vomit.  Have a sore throat if you had a breathing tube during the procedure. Follow these instructions at home: For at least 24 hours after the procedure:      Have a responsible adult stay with you. It is important to have someone help care for you until you are awake and alert.  Rest as needed.  Do not: ? Participate in activities in which you could fall or become injured. ? Drive. ? Use heavy machinery. ? Drink alcohol. ? Take sleeping pills or medicines that cause drowsiness. ? Make important decisions or sign legal documents. ? Take care of children on your own. Eating and drinking  Follow the diet that is recommended by your health care provider.  If you vomit, drink water, juice, or soup when you can drink without vomiting.  Make sure you have little or no nausea before eating solid foods. General instructions  Take over-the-counter and prescription medicines only as told by your health care provider.  If you have sleep apnea, surgery and certain medicines can increase your risk for breathing problems. Follow instructions from your health care provider about wearing your sleep device: ? Anytime you are sleeping, including during daytime naps. ? While taking prescription pain medicines, sleeping medicines, or medicines that make you drowsy.  If you smoke, do not smoke without supervision.  Keep all follow-up visits as told by your health care provider. This is important. Contact a health care provider if:  You keep feeling nauseous or you keep vomiting.  You feel light-headed.  You develop a rash.  You have a fever. Get help right away if:  You have trouble breathing. Summary  For several hours after your procedure, you may feel sleepy and have poor judgment.  Have a responsible adult stay with you for at least  24 hours or until you are awake and alert. This information is not  intended to replace advice given to you by your health care provider. Make sure you discuss any questions you have with your health care provider. Document Revised: 07/10/2017 Document Reviewed: 08/02/2015 Elsevier Patient Education  2020 [employer].

## 2019-07-03 ENCOUNTER — Emergency Department (HOSPITAL_COMMUNITY): Payer: Managed Care, Other (non HMO)

## 2019-07-03 ENCOUNTER — Other Ambulatory Visit: Payer: Self-pay

## 2019-07-03 ENCOUNTER — Encounter (HOSPITAL_COMMUNITY): Payer: Self-pay | Admitting: Emergency Medicine

## 2019-07-03 ENCOUNTER — Emergency Department (HOSPITAL_COMMUNITY)
Admission: EM | Admit: 2019-07-03 | Discharge: 2019-07-04 | Disposition: A | Discharge status: Home / Self Care | Payer: Managed Care, Other (non HMO) | Attending: Emergency Medicine | Admitting: Emergency Medicine

## 2019-07-03 DIAGNOSIS — R109 Unspecified abdominal pain: Secondary | ICD-10-CM | POA: Insufficient documentation

## 2019-07-03 DIAGNOSIS — Z79899 Other long term (current) drug therapy: Secondary | ICD-10-CM | POA: Diagnosis not present

## 2019-07-03 DIAGNOSIS — F17210 Nicotine dependence, cigarettes, uncomplicated: Secondary | ICD-10-CM | POA: Diagnosis not present

## 2019-07-03 DIAGNOSIS — I10 Essential (primary) hypertension: Secondary | ICD-10-CM | POA: Insufficient documentation

## 2019-07-03 LAB — URINALYSIS, ROUTINE W REFLEX MICROSCOPIC
Bacteria, UA: NONE SEEN
Bilirubin Urine: NEGATIVE
Glucose, UA: NEGATIVE mg/dL
Ketones, ur: 5 mg/dL — AB
Leukocytes,Ua: NEGATIVE
Nitrite: NEGATIVE
Protein, ur: 100 mg/dL — AB
RBC / HPF: >50 RBC/hpf — ABNORMAL HIGH (ref 0–5)
Specific Gravity, Urine: 1.017 (ref 1.005–1.030)
pH: 7 (ref 5.0–8.0)

## 2019-07-03 LAB — I-STAT BETA HCG BLOOD, ED (MC, WL, AP ONLY): I-stat hCG, quantitative: <5 m[IU]/mL (ref <5)

## 2019-07-03 MED ORDER — SODIUM CHLORIDE 0.9 % IV BOLUS
1000.0000 mL | Freq: Once | INTRAVENOUS | Status: AC
  Administered 2019-07-03: 1000 mL via INTRAVENOUS

## 2019-07-03 MED ORDER — ONDANSETRON HCL 4 MG/2ML IJ SOLN
4.0000 mg | Freq: Once | INTRAMUSCULAR | Status: AC
  Administered 2019-07-03: 23:00:00 4 mg via INTRAVENOUS
  Filled 2019-07-03: qty 2

## 2019-07-03 MED ORDER — SODIUM CHLORIDE 0.9 % IV SOLN: INTRAVENOUS | Status: DC

## 2019-07-03 MED ORDER — HYDROMORPHONE HCL 1 MG/ML IJ SOLN
1.0000 mg | Freq: Once | INTRAMUSCULAR | Status: AC
  Administered 2019-07-03: 1 mg via INTRAVENOUS
  Filled 2019-07-03: qty 1

## 2019-07-03 NOTE — ED Notes (Signed)
Pt ambulatory to and from bathroom with with standby assistance.

## 2019-07-03 NOTE — ED Triage Notes (Signed)
Patient c/o right flank pain worsening today. Reports she has a stent in place with surgery scheduled for 3/15. Reports N/V.

## 2019-07-04 ENCOUNTER — Other Ambulatory Visit (HOSPITAL_COMMUNITY)
Admission: RE | Admit: 2019-07-04 | Discharge: 2019-07-04 | Disposition: A | Discharge status: Home / Self Care | Payer: Managed Care, Other (non HMO) | Source: Ambulatory Visit | Attending: Urology | Admitting: Urology

## 2019-07-04 ENCOUNTER — Other Ambulatory Visit: Payer: Self-pay

## 2019-07-04 ENCOUNTER — Encounter (HOSPITAL_COMMUNITY)
Admission: RE | Admit: 2019-07-04 | Discharge: 2019-07-04 | Disposition: A | Discharge status: Home / Self Care | Payer: Managed Care, Other (non HMO) | Source: Ambulatory Visit | Attending: Urology | Admitting: Urology

## 2019-07-04 ENCOUNTER — Encounter (HOSPITAL_COMMUNITY): Payer: Self-pay

## 2019-07-04 DIAGNOSIS — Z20822 Contact with and (suspected) exposure to covid-19: Secondary | ICD-10-CM | POA: Insufficient documentation

## 2019-07-04 DIAGNOSIS — Z01810 Encounter for preprocedural cardiovascular examination: Secondary | ICD-10-CM | POA: Insufficient documentation

## 2019-07-04 DIAGNOSIS — Z01812 Encounter for preprocedural laboratory examination: Secondary | ICD-10-CM | POA: Insufficient documentation

## 2019-07-04 HISTORY — DX: Personal history of urinary calculi: Z87.442

## 2019-07-04 HISTORY — DX: Gastro-esophageal reflux disease without esophagitis: K21.9

## 2019-07-04 LAB — BASIC METABOLIC PANEL
Anion gap: 9 (ref 5–15)
BUN: 12 mg/dL (ref 6–20)
CO2: 23 mmol/L (ref 22–32)
Calcium: 8.2 mg/dL — ABNORMAL LOW (ref 8.9–10.3)
Chloride: 109 mmol/L (ref 98–111)
Creatinine, Ser: 0.75 mg/dL (ref 0.44–1.00)
GFR calc Af Amer: >60 mL/min (ref >60)
GFR calc non Af Amer: >60 mL/min (ref >60)
Glucose, Bld: 97 mg/dL (ref 70–99)
Potassium: 3.7 mmol/L (ref 3.5–5.1)
Sodium: 141 mmol/L (ref 135–145)

## 2019-07-04 LAB — CBC WITH DIFFERENTIAL/PLATELET
Abs Immature Granulocytes: 0.04 10*3/uL (ref 0.00–0.07)
Basophils Absolute: 0 10*3/uL (ref 0.0–0.1)
Basophils Relative: 0 %
Eosinophils Absolute: 0.1 10*3/uL (ref 0.0–0.5)
Eosinophils Relative: 1 %
HCT: 40.7 % (ref 36.0–46.0)
Hemoglobin: 13.7 g/dL (ref 12.0–15.0)
Immature Granulocytes: 0 %
Lymphocytes Relative: 21 %
Lymphs Abs: 2.5 10*3/uL (ref 0.7–4.0)
MCH: 30.6 pg (ref 26.0–34.0)
MCHC: 33.7 g/dL (ref 30.0–36.0)
MCV: 91.1 fL (ref 80.0–100.0)
Monocytes Absolute: 0.5 10*3/uL (ref 0.1–1.0)
Monocytes Relative: 4 %
Neutro Abs: 9 10*3/uL — ABNORMAL HIGH (ref 1.7–7.7)
Neutrophils Relative %: 74 %
Platelets: 286 10*3/uL (ref 150–400)
RBC: 4.47 MIL/uL (ref 3.87–5.11)
RDW: 12.9 % (ref 11.5–15.5)
WBC: 12.2 10*3/uL — ABNORMAL HIGH (ref 4.0–10.5)
nRBC: 0 % (ref 0.0–0.2)

## 2019-07-04 LAB — URINE CULTURE

## 2019-07-04 LAB — HCG, SERUM, QUALITATIVE: Preg, Serum: NEGATIVE

## 2019-07-04 MED ORDER — HYDROMORPHONE HCL 1 MG/ML IJ SOLN
0.5000 mg | Freq: Once | INTRAMUSCULAR | Status: AC
  Administered 2019-07-04: 01:00:00 0.5 mg via INTRAVENOUS
  Filled 2019-07-04: qty 1

## 2019-07-04 MED ORDER — ONDANSETRON HCL 4 MG/2ML IJ SOLN
4.0000 mg | Freq: Once | INTRAMUSCULAR | Status: AC
  Administered 2019-07-04: 01:00:00 4 mg via INTRAVENOUS
  Filled 2019-07-04: qty 2

## 2019-07-04 NOTE — Discharge Instructions (Addendum)
Urine culture pending.  Work-up including CT scan renal function and urinalysis without significant findings.  The pain is probably related to the stents.  Reviewed with on-call urologist.  Follow-up with Dr. Ronne Binning as planned.  Take the pain medicine at home.  You can take to the Percocet if needed.

## 2019-07-04 NOTE — ED Provider Notes (Signed)
[facility]-EMERGENCY DEPT Provider Note   CSN: [id number] Arrival date & time: 07/03/19  2012     History Chief Complaint  Patient presents with  . Flank Pain    Bianca [name] is a 34 y.o. female.  Patient status post right ureteral stent placed by Dr. Ronne Binning from urology.  This was back in February.  Patient is scheduled to have that stent changed out on March 15.  Patient today with sudden increase in flank pain.  Contact Dr. Ronne Binning who recommended that she come for evaluation.  Past medical history significant for hypertension kidney stones and recurrent urinary tract infections.  Patient denies any fevers.  Patient does have Percocet at home.  She had not been taking it but she took some today.  And it was not helping the pain.  No fever no upper respiratory symptoms no cough.        Past Medical History:  Diagnosis Date  . History of recurrent UTIs   . Hypertension   . Kidney stones     Patient Active Problem List   Diagnosis Date Noted  . Ureteral calculus 06/13/2019  . Essential hypertension 07/30/2018  . Cellulitis 07/28/2018  . Cat bite of finger 07/27/2018 07/28/2018  . Heart palpitations 05/26/2017  . Chest discomfort 05/26/2017  . Dyspareunia, female 09/06/2016  . Dysmenorrhea 09/06/2016  . Menorrhagia with regular cycle 09/06/2016  . Postpartum hypertension, resolved 08/18/2014  . NSVD (normal spontaneous vaginal delivery) 05/30/2014  . Preeclampsia, severe 05/29/2014  . Right nephrolithiasis 03/17/2014  . Hydronephrosis determined by ultrasound 03/16/2014  . Hydronephrosis of right kidney 03/15/2014  . Benign essential hypertension antepartum 10/22/2013  . Smoker 10/22/2013  . History of gestational hypertension 10/22/2013    Past Surgical History:  Procedure Laterality Date  . CYSTOSCOPY WITH STENT PLACEMENT Right 06/14/2019   Procedure: Cystoscopy, Right Retrograde Pyelogram, Right Stent Placement;  Surgeon: Malen Gauze,  MD;  Location: [facility];  Service: Urology;  Laterality: Right;  . NO PAST SURGERIES       OB History    Gravida  3   Para  3   Term  2   Preterm  1   AB      Living  3     SAB      TAB      Ectopic      Multiple  0   Live Births  3           Family History  Problem Relation Age of Onset  . Heart disease Paternal Grandmother   . Cancer Paternal Grandmother        breast  . Thyroid disease Father   . Cancer Father        lung  . Hodgkin's lymphoma Father   . Sudden Cardiac Death Father        Was not sure if was an MI or not.  Was during cancer treatment.  No autopsy done  . Kidney disease Mother        GN3N  . Other Maternal Grandmother        Guillian Barre syndrome    Social History   Tobacco Use  . Smoking status: Current Every Day Smoker    Packs/day: 0.50    Types: Cigarettes    Last attempt to quit: 03/25/2014    Years since quitting: 5.2  . Smokeless tobacco: Never Used  Substance Use Topics  . Alcohol use: No  . Drug use: No  Home Medications Prior to Admission medications   Medication Sig Start Date End Date Taking? Authorizing Provider  amLODipine (NORVASC) 5 MG tablet Take 1 tablet (5 mg total) by mouth daily. 07/31/18  Yes Kathie Dike, MD  ibuprofen (ADVIL,MOTRIN) 800 MG tablet Take 1 tablet (800 mg total) by mouth 3 (three) times daily. Take with food Patient taking differently: Take 800 mg by mouth every 8 (eight) hours as needed for moderate pain. Take with food 06/09/18  Yes Triplett, Tammy, PA-C  oxyCODONE-acetaminophen (PERCOCET) 5-325 MG tablet Take 1 tablet by mouth every 4 (four) hours as needed for moderate pain or severe pain. 06/14/19 06/13/20 Yes McKenzie, Candee Furbish, MD  nicotine (NICODERM CQ - DOSED IN MG/24 HOURS) 14 mg/24hr patch Place 1 patch (14 mg total) onto the skin daily. Patient not taking: Reported on 07/02/2019 07/31/18   Kathie Dike, MD    Allergies    Codeine  Review of Systems   Review of Systems    Constitutional: Negative for chills and fever.  HENT: Negative for rhinorrhea and sore throat.   Eyes: Negative for visual disturbance.  Respiratory: Negative for cough and shortness of breath.   Cardiovascular: Negative for chest pain and leg swelling.  Gastrointestinal: Negative for abdominal pain, diarrhea, nausea and vomiting.  Genitourinary: Positive for flank pain. Negative for dysuria.  Musculoskeletal: Negative for back pain and neck pain.  Skin: Negative for rash.  Neurological: Negative for dizziness, light-headedness and headaches.  Hematological: Does not bruise/bleed easily.  Psychiatric/Behavioral: Negative for confusion.    Physical Exam Updated Vital Signs BP 135/81   Pulse 79   Temp 98.7 F (37.1 C)   Resp 20   SpO2 96%   Physical Exam Vitals and nursing note reviewed.  Constitutional:      General: She is not in acute distress.    Appearance: Normal appearance. She is well-developed.  HENT:     Head: Normocephalic and atraumatic.  Eyes:     Extraocular Movements: Extraocular movements intact.     Conjunctiva/sclera: Conjunctivae normal.     Pupils: Pupils are equal, round, and reactive to light.  Cardiovascular:     Rate and Rhythm: Normal rate and regular rhythm.     Heart sounds: No murmur.  Pulmonary:     Effort: Pulmonary effort is normal. No respiratory distress.     Breath sounds: Normal breath sounds.  Abdominal:     Palpations: Abdomen is soft.     Tenderness: There is no abdominal tenderness.  Musculoskeletal:        General: Normal range of motion.     Cervical back: Normal range of motion and neck supple.  Skin:    General: Skin is warm and dry.  Neurological:     General: No focal deficit present.     Mental Status: She is alert and oriented to person, place, and time.     Cranial Nerves: No cranial nerve deficit.     Sensory: No sensory deficit.     ED Results / Procedures / Treatments   Labs (all labs ordered are listed, but  only abnormal results are displayed) Labs Reviewed  URINALYSIS, ROUTINE W REFLEX MICROSCOPIC - Abnormal; Notable for the following components:      Result Value   Color, Urine RED (*)    APPearance CLOUDY (*)    Hgb urine dipstick LARGE (*)    Ketones, ur 5 (*)    Protein, ur 100 (*)    RBC / HPF >50 (*)  All other components within normal limits  CBC WITH DIFFERENTIAL/PLATELET - Abnormal; Notable for the following components:   WBC 12.2 (*)    Neutro Abs 9.0 (*)    All other components within normal limits  BASIC METABOLIC PANEL - Abnormal; Notable for the following components:   Calcium 8.2 (*)    All other components within normal limits  URINE CULTURE  I-STAT BETA HCG BLOOD, ED ([facility], [facility], [facility] ONLY)    EKG None  Radiology CT RENAL STONE STUDY  Result Date: 07/03/2019 CLINICAL DATA:  Right flank pain with nausea and vomiting history stent placement 06/14/2019 EXAM: CT ABDOMEN AND PELVIS WITHOUT CONTRAST TECHNIQUE: Multidetector CT imaging of the abdomen and pelvis was performed following the standard protocol without IV contrast. COMPARISON:  CT 06/13/2019 FINDINGS: Lower chest: Lung bases demonstrate no acute consolidation or effusion. Hepatobiliary: No focal liver abnormality is seen. No gallstones, gallbladder wall thickening, or biliary dilatation. Pancreas: Unremarkable. No pancreatic ductal dilatation or surrounding inflammatory changes. Spleen: Normal in size without focal abnormality. Adrenals/Urinary Tract: Adrenal glands are normal. Multiple left intrarenal stones measuring up to 5 mm at the lower pole. Interval placement of right ureteral stent. Proximal pigtail position at the right UPJ, distal pigtail position at the left anterior inferior bladder. 5 mm stone adjacent to the ureteral stent distally, about a cm proximal to the right UVJ. Stomach/Bowel: Stomach is within normal limits. Appendix appears normal. No evidence of bowel wall thickening, distention, or inflammatory  changes. Vascular/Lymphatic: No significant vascular findings are present. No enlarged abdominal or pelvic lymph nodes. Reproductive: Uterus and bilateral adnexa are unremarkable. Other: Negative for free air or free fluid Musculoskeletal: No acute or significant osseous findings. IMPRESSION: 1. Persistent severe right hydronephrosis and hydroureter. Interval placement of a right ureteral stent with proximal aspect of the stent positioned at the UPJ/proximal right ureter. 5 mm stone fragment in the distal right ureter adjacent to the stent about a cm proximal to the UVJ. Correlate for stent function. 2. Nonobstructing intrarenal stone on the left Electronically Signed   By: Jasmine Pang M.D.   On: 07/03/2019 23:57    Procedures Procedures (including critical care time)  Medications Ordered in ED Medications  0.9 %  sodium chloride infusion (has no administration in time range)  ondansetron (ZOFRAN) injection 4 mg (has no administration in time range)  HYDROmorphone (DILAUDID) injection 0.5 mg (has no administration in time range)  sodium chloride 0.9 % bolus 1,000 mL (1,000 mLs Intravenous New Bag/Given 07/03/19 2313)  ondansetron (ZOFRAN) injection 4 mg (4 mg Intravenous Given 07/03/19 2313)  HYDROmorphone (DILAUDID) injection 1 mg (1 mg Intravenous Given 07/03/19 2313)    ED Course  I have reviewed the triage vital signs and the nursing notes.  Pertinent labs & imaging results that were available during my care of the patient were reviewed by me and considered in my medical decision making (see chart for details).    MDM Rules/Calculators/A&P                     Work-up for the flank pain include urinalysis labs and CT renal study.  Urinalysis not consistent with urinary tract infection a little bit of a leukocytosis with white blood cell count of 12,000.  Renal function is normal.  Spoke with Dr. Retta Diones is on call urology.  Went over the CT scan findings with him.  She felt that patient  was good for home.  Urine culture has been sent and is  pending.  Patient is now having good pain control with hydromorphone.  Will redose with hydromorphone before going home with a dose of Zofran.  Did inform patient she can take 2 of her Percocet at home if necessary.  She has follow-up as a preop tomorrow with Dr. Ronne Binning.  That has plans on March 15 to have the stent changed out.  Patient stable for discharge home.  Final Clinical Impression(s) / ED Diagnoses Final diagnoses:  Right flank pain    Rx / DC Orders ED Discharge Orders    None       Vanetta Mulders, MD 07/04/19 0040

## 2019-07-05 LAB — SARS CORONAVIRUS 2 (TAT 6-24 HRS): SARS Coronavirus 2: NEGATIVE

## 2019-07-08 ENCOUNTER — Ambulatory Visit (HOSPITAL_COMMUNITY): Payer: Managed Care, Other (non HMO) | Admitting: Anesthesiology

## 2019-07-08 ENCOUNTER — Encounter (HOSPITAL_COMMUNITY)
Admission: RE | Disposition: A | Discharge status: Home / Self Care | Payer: Self-pay | Source: Home / Self Care | Attending: Urology

## 2019-07-08 ENCOUNTER — Ambulatory Visit (HOSPITAL_COMMUNITY): Payer: Managed Care, Other (non HMO)

## 2019-07-08 ENCOUNTER — Encounter (HOSPITAL_COMMUNITY): Payer: Self-pay | Admitting: Urology

## 2019-07-08 ENCOUNTER — Ambulatory Visit (HOSPITAL_COMMUNITY)
Admission: RE | Admit: 2019-07-08 | Discharge: 2019-07-08 | Disposition: A | Discharge status: Home / Self Care | Payer: Managed Care, Other (non HMO) | Attending: Urology | Admitting: Urology

## 2019-07-08 DIAGNOSIS — N201 Calculus of ureter: Secondary | ICD-10-CM

## 2019-07-08 DIAGNOSIS — Z79899 Other long term (current) drug therapy: Secondary | ICD-10-CM | POA: Insufficient documentation

## 2019-07-08 DIAGNOSIS — N132 Hydronephrosis with renal and ureteral calculous obstruction: Secondary | ICD-10-CM | POA: Diagnosis present

## 2019-07-08 DIAGNOSIS — Z419 Encounter for procedure for purposes other than remedying health state, unspecified: Secondary | ICD-10-CM

## 2019-07-08 DIAGNOSIS — Z8616 Personal history of COVID-19: Secondary | ICD-10-CM | POA: Diagnosis not present

## 2019-07-08 DIAGNOSIS — K219 Gastro-esophageal reflux disease without esophagitis: Secondary | ICD-10-CM | POA: Diagnosis not present

## 2019-07-08 DIAGNOSIS — F17210 Nicotine dependence, cigarettes, uncomplicated: Secondary | ICD-10-CM | POA: Diagnosis not present

## 2019-07-08 DIAGNOSIS — I10 Essential (primary) hypertension: Secondary | ICD-10-CM | POA: Insufficient documentation

## 2019-07-08 HISTORY — PX: HOLMIUM LASER APPLICATION: SHX5852

## 2019-07-08 HISTORY — PX: CYSTOSCOPY/URETEROSCOPY/HOLMIUM LASER/STENT PLACEMENT: SHX6546

## 2019-07-08 SURGERY — CYSTOSCOPY/URETEROSCOPY/HOLMIUM LASER/STENT PLACEMENT
Anesthesia: General | Site: Ureter | Laterality: Right

## 2019-07-08 MED ORDER — DEXAMETHASONE SODIUM PHOSPHATE 4 MG/ML IJ SOLN
INTRAMUSCULAR | Status: DC | PRN
  Administered 2019-07-08: 10 mg via INTRAVENOUS

## 2019-07-08 MED ORDER — SUCCINYLCHOLINE 20MG/ML (10ML) SYRINGE FOR MEDFUSION PUMP - OPTIME
INTRAMUSCULAR | Status: DC | PRN
  Administered 2019-07-08: 100 mg via INTRAVENOUS

## 2019-07-08 MED ORDER — MEPERIDINE HCL 50 MG/ML IJ SOLN: 6.2500 mg | INTRAMUSCULAR | Status: DC | PRN

## 2019-07-08 MED ORDER — CEFAZOLIN SODIUM-DEXTROSE 2-4 GM/100ML-% IV SOLN
2.0000 g | INTRAVENOUS | Status: AC
  Administered 2019-07-08: 2 g via INTRAVENOUS

## 2019-07-08 MED ORDER — CEFAZOLIN SODIUM-DEXTROSE 2-4 GM/100ML-% IV SOLN
INTRAVENOUS | Status: AC
  Filled 2019-07-08: qty 100

## 2019-07-08 MED ORDER — LIDOCAINE HCL URETHRAL/MUCOSAL 2 % EX GEL
CUTANEOUS | Status: AC
  Filled 2019-07-08: qty 10

## 2019-07-08 MED ORDER — OXYCODONE-ACETAMINOPHEN 5-325 MG PO TABS: 1.0000 | ORAL_TABLET | ORAL | 0 refills | Status: AC | PRN

## 2019-07-08 MED ORDER — FENTANYL CITRATE (PF) 100 MCG/2ML IJ SOLN
INTRAMUSCULAR | Status: DC | PRN
  Administered 2019-07-08 (×3): 50 ug via INTRAVENOUS

## 2019-07-08 MED ORDER — SODIUM CHLORIDE 0.9 % IR SOLN
Status: DC | PRN
  Administered 2019-07-08: 3000 mL via INTRAVESICAL

## 2019-07-08 MED ORDER — ONDANSETRON HCL 4 MG/2ML IJ SOLN
INTRAMUSCULAR | Status: DC | PRN
  Administered 2019-07-08: 4 mg via INTRAVENOUS

## 2019-07-08 MED ORDER — LACTATED RINGERS IV SOLN
Freq: Once | INTRAVENOUS | Status: AC
  Administered 2019-07-08: 1000 mL via INTRAVENOUS

## 2019-07-08 MED ORDER — MIDAZOLAM HCL 2 MG/2ML IJ SOLN
INTRAMUSCULAR | Status: AC
  Filled 2019-07-08: qty 2

## 2019-07-08 MED ORDER — ONDANSETRON HCL 4 MG PO TABS: 4.0000 mg | ORAL_TABLET | Freq: Every day | ORAL | 1 refills | Status: AC | PRN

## 2019-07-08 MED ORDER — MIDAZOLAM HCL 5 MG/5ML IJ SOLN
INTRAMUSCULAR | Status: DC | PRN
  Administered 2019-07-08: 2 mg via INTRAVENOUS

## 2019-07-08 MED ORDER — STERILE WATER FOR IRRIGATION IR SOLN
Status: DC | PRN
  Administered 2019-07-08: 500 mL

## 2019-07-08 MED ORDER — PROPOFOL 10 MG/ML IV BOLUS
INTRAVENOUS | Status: DC | PRN
  Administered 2019-07-08: 200 mg via INTRAVENOUS

## 2019-07-08 MED ORDER — PHENYLEPHRINE 40 MCG/ML (10ML) SYRINGE FOR IV PUSH (FOR BLOOD PRESSURE SUPPORT)
PREFILLED_SYRINGE | INTRAVENOUS | Status: AC
  Filled 2019-07-08: qty 20

## 2019-07-08 MED ORDER — ONDANSETRON HCL 4 MG/2ML IJ SOLN
INTRAMUSCULAR | Status: AC
  Filled 2019-07-08: qty 2

## 2019-07-08 MED ORDER — LIDOCAINE 2% (20 MG/ML) 5 ML SYRINGE
INTRAMUSCULAR | Status: AC
  Filled 2019-07-08: qty 10

## 2019-07-08 MED ORDER — DIATRIZOATE MEGLUMINE 30 % UR SOLN
URETHRAL | Status: DC | PRN
  Administered 2019-07-08: 8 mL via URETHRAL

## 2019-07-08 MED ORDER — DIATRIZOATE MEGLUMINE 30 % UR SOLN
URETHRAL | Status: AC
  Filled 2019-07-08: qty 100

## 2019-07-08 MED ORDER — ONDANSETRON HCL 4 MG/2ML IJ SOLN: 4.0000 mg | Freq: Once | INTRAMUSCULAR | Status: DC | PRN

## 2019-07-08 MED ORDER — SUCCINYLCHOLINE CHLORIDE 200 MG/10ML IV SOSY
PREFILLED_SYRINGE | INTRAVENOUS | Status: AC
  Filled 2019-07-08: qty 10

## 2019-07-08 MED ORDER — LIDOCAINE HCL (CARDIAC) PF 50 MG/5ML IV SOSY
PREFILLED_SYRINGE | INTRAVENOUS | Status: DC | PRN
  Administered 2019-07-08: 60 mg via INTRAVENOUS

## 2019-07-08 MED ORDER — FENTANYL CITRATE (PF) 250 MCG/5ML IJ SOLN
INTRAMUSCULAR | Status: AC
  Filled 2019-07-08: qty 5

## 2019-07-08 MED ORDER — LACTATED RINGERS IV SOLN: INTRAVENOUS | Status: DC | PRN

## 2019-07-08 MED ORDER — HYDROMORPHONE HCL 1 MG/ML IJ SOLN
0.2500 mg | INTRAMUSCULAR | Status: DC | PRN
  Administered 2019-07-08: 0.5 mg via INTRAVENOUS
  Filled 2019-07-08: qty 0.5

## 2019-07-08 SURGICAL SUPPLY — 20 items
BAG DRAIN URO TABLE W/ADPT NS (BAG) ×3 IMPLANT
BAG HAMPER (MISCELLANEOUS) ×3 IMPLANT
CATH INTERMIT  6FR 70CM (CATHETERS) ×3 IMPLANT
CLOTH BEACON ORANGE TIMEOUT ST (SAFETY) ×3 IMPLANT
DECANTER SPIKE VIAL GLASS SM (MISCELLANEOUS) ×3 IMPLANT
EXTRACTOR STONE NITINOL NGAGE (UROLOGICAL SUPPLIES) ×3 IMPLANT
FIBER LASER FLEXIVA 200 (UROLOGICAL SUPPLIES) ×3 IMPLANT
GLOVE BIO SURGEON STRL SZ7 (GLOVE) ×3 IMPLANT
GLOVE BIO SURGEON STRL SZ8 (GLOVE) ×3 IMPLANT
GLOVE BIOGEL PI IND STRL 7.0 (GLOVE) ×2 IMPLANT
GLOVE BIOGEL PI INDICATOR 7.0 (GLOVE) ×4
GOWN STRL REUS W/TWL LRG LVL3 (GOWN DISPOSABLE) ×3 IMPLANT
GOWN STRL REUS W/TWL XL LVL3 (GOWN DISPOSABLE) ×3 IMPLANT
GUIDEWIRE ANG ZIPWIRE 038X150 (WIRE) ×3 IMPLANT
IV NS IRRIG 3000ML ARTHROMATIC (IV SOLUTION) ×6 IMPLANT
MANIFOLD NEPTUNE II (INSTRUMENTS) ×3 IMPLANT
PACK CYSTO (CUSTOM PROCEDURE TRAY) ×3 IMPLANT
PAD ARMBOARD 7.5X6 YLW CONV (MISCELLANEOUS) ×3 IMPLANT
SYR 10ML LL (SYRINGE) ×3 IMPLANT
TOWEL NATURAL 4PK STERILE (DISPOSABLE) ×3 IMPLANT

## 2019-07-08 NOTE — Anesthesia Postprocedure Evaluation (Signed)
Anesthesia Post Note  Patient: Wakisha Alberts  Procedure(s) Performed: RIGHT CYSTOSCOPY/URETEROSCOPY/RIGHT RETROGRADE PYELOGRAM/HOLMIUM LASER/STENT EXCHANGE (Right Ureter)  Patient location during evaluation: PACU Anesthesia Type: General Level of consciousness: awake and alert and oriented Pain management: pain level controlled Vital Signs Assessment: post-procedure vital signs reviewed and stable Respiratory status: spontaneous breathing Cardiovascular status: blood pressure returned to baseline and stable Postop Assessment: no apparent nausea or vomiting Anesthetic complications: no     Last Vitals:  Vitals:   07/08/19 1200 07/08/19 1211  BP:  (!) 163/99  Pulse: (!) 53 71  Resp: 10 16  Temp:  36.6 C  SpO2: 95% 96%    Last Pain:  Vitals:   07/08/19 1211  TempSrc: Oral  PainSc: 0-No pain                 Takeria Marquina

## 2019-07-08 NOTE — Anesthesia Procedure Notes (Signed)
Procedure Name: Intubation Date/Time: 07/08/2019 10:04 AM Performed by: Ollen Bowl, CRNA Pre-anesthesia Checklist: Patient identified, Patient being monitored, Timeout performed, Emergency Drugs available and Suction available Patient Re-evaluated:Patient Re-evaluated prior to induction Oxygen Delivery Method: Circle system utilized Preoxygenation: Pre-oxygenation with 100% oxygen Induction Type: IV induction Ventilation: Mask ventilation without difficulty Laryngoscope Size: Mac and 3 Grade View: Grade I Tube type: Oral Tube size: 7.0 mm Number of attempts: 1 Airway Equipment and Method: Stylet Placement Confirmation: ETT inserted through vocal cords under direct vision,  positive ETCO2 and breath sounds checked- equal and bilateral Secured at: 21 cm Tube secured with: Tape Dental Injury: Teeth and Oropharynx as per pre-operative assessment

## 2019-07-08 NOTE — Anesthesia Preprocedure Evaluation (Signed)
Anesthesia Evaluation  Patient identified by MRN, date of birth, ID band Patient awake    Reviewed: Allergy & Precautions, NPO status , Patient's Chart, lab work & pertinent test results  History of Anesthesia Complications (+) history of anesthetic complications  Airway Mallampati: II  TM Distance: >3 FB Neck ROM: Full    Dental  (+) Chipped, Poor Dentition,    Pulmonary Current Smoker and Patient abstained from smoking.,    Pulmonary exam normal breath sounds clear to auscultation       Cardiovascular Exercise Tolerance: Good hypertension, Pt. on medications Normal cardiovascular exam Rhythm:Regular Rate:Normal     Neuro/Psych negative neurological ROS  negative psych ROS   GI/Hepatic Neg liver ROS, GERD (did not take prilosec to;day)  Medicated and Controlled,  Endo/Other  negative endocrine ROS  Renal/GU Renal disease (renal stones)     Musculoskeletal negative musculoskeletal ROS (+)   Abdominal   Peds  Hematology negative hematology ROS (+)   Anesthesia Other Findings Right Ureteral Stone  Reproductive/Obstetrics negative OB ROS hcg negative                            Anesthesia Physical  Anesthesia Plan  ASA: II  Anesthesia Plan: General   Post-op Pain Management:    Induction: Intravenous  PONV Risk Score and Plan: 3 and Ondansetron, Dexamethasone and Midazolam  Airway Management Planned: Oral ETT  Additional Equipment:   Intra-op Plan:   Post-operative Plan: Extubation in OR  Informed Consent: I have reviewed the patients History and Physical, chart, labs and discussed the procedure including the risks, benefits and alternatives for the proposed anesthesia with the patient or authorized representative who has indicated his/her understanding and acceptance.     Dental advisory given  Plan Discussed with: CRNA and Surgeon  Anesthesia Plan Comments:         Anesthesia Quick Evaluation

## 2019-07-08 NOTE — Discharge Instructions (Signed)
Ureteral Stent Implantation, Care After This sheet gives you information about how to care for yourself after your procedure. Your health care provider may also give you more specific instructions. If you have problems or questions, contact your health care provider. What can I expect after the procedure? After the procedure, it is common to have:  Nausea.  Mild pain when you urinate. You may feel this pain in your lower back or lower abdomen. The pain should stop within a few minutes after you urinate. This may last for up to 1 week.  A small amount of blood in your urine for several days. Follow these instructions at home: Medicines  Take over-the-counter and prescription medicines only as told by your health care provider.  If you were prescribed an antibiotic medicine, take it as told by your health care provider. Do not stop taking the antibiotic even if you start to feel better.  Do not drive for 24 hours if you were given a sedative during your procedure.  Ask your health care provider if the medicine prescribed to you requires you to avoid driving or using heavy machinery. Activity  Rest as told by your health care provider.  Avoid sitting for a long time without moving. Get up to take short walks every 1-2 hours. This is important to improve blood flow and breathing. Ask for help if you feel weak or unsteady.  Return to your normal activities as told by your health care provider. Ask your health care provider what activities are safe for you. General instructions   Watch for any blood in your urine. Call your health care provider if the amount of blood in your urine increases.  If you have a catheter: ? Follow instructions from your health care provider about taking care of your catheter and collection bag. ? Do not take baths, swim, or use a hot tub until your health care provider approves. Ask your health care provider if you may take showers. You may only be allowed to  take sponge baths.  Drink enough fluid to keep your urine pale yellow.  Do not use any products that contain nicotine or tobacco, such as cigarettes, e-cigarettes, and chewing tobacco. These can delay healing after surgery. If you need help quitting, ask your health care provider.  Keep all follow-up visits as told by your health care provider. This is important. Contact a health care provider if:  You have pain that gets worse or does not get better with medicine, especially pain when you urinate.  You have difficulty urinating.  You feel nauseous or you vomit repeatedly during a period of more than 2 days after the procedure. Get help right away if:  Your urine is dark red or has blood clots in it.  You are leaking urine (have incontinence).  The end of the stent comes out of your urethra.  You cannot urinate.  You have sudden, sharp, or severe pain in your abdomen or lower back.  You have a fever.  You have swelling or pain in your legs.  You have difficulty breathing. Summary  After the procedure, it is common to have mild pain when you urinate that goes away within a few minutes after you urinate. This may last for up to 1 week.  Watch for any blood in your urine. Call your health care provider if the amount of blood in your urine increases.  Take over-the-counter and prescription medicines only as told by your health care provider.  Drink   enough fluid to keep your urine pale yellow. This information is not intended to replace advice given to you by your health care provider. Make sure you discuss any questions you have with your health care provider. Document Revised: 01/16/2018 Document Reviewed: 01/17/2018 Elsevier Patient Education  2020 [employer]  General Anesthesia, Adult, Care After This sheet gives you information about how to care for yourself after your procedure. Your health care provider may also give you more specific instructions. If you have  problems or questions, contact your health care provider. What can I expect after the procedure? After the procedure, the following side effects are common:  Pain or discomfort at the IV site.  Nausea.  Vomiting.  Sore throat.  Trouble concentrating.  Feeling cold or chills.  Weak or tired.  Sleepiness and fatigue.  Soreness and body aches. These side effects can affect parts of the body that were not involved in surgery. Follow these instructions at home:  For at least 24 hours after the procedure:  Have a responsible adult stay with you. It is important to have someone help care for you until you are awake and alert.  Rest as needed.  Do not: ? Participate in activities in which you could fall or become injured. ? Drive. ? Use heavy machinery. ? Drink alcohol. ? Take sleeping pills or medicines that cause drowsiness. ? Make important decisions or sign legal documents. ? Take care of children on your own. Eating and drinking  Follow any instructions from your health care provider about eating or drinking restrictions.  When you feel hungry, start by eating small amounts of foods that are soft and easy to digest (bland), such as toast. Gradually return to your regular diet.  Drink enough fluid to keep your urine pale yellow.  If you vomit, rehydrate by drinking water, juice, or clear broth. General instructions  If you have sleep apnea, surgery and certain medicines can increase your risk for breathing problems. Follow instructions from your health care provider about wearing your sleep device: ? Anytime you are sleeping, including during daytime naps. ? While taking prescription pain medicines, sleeping medicines, or medicines that make you drowsy.  Return to your normal activities as told by your health care provider. Ask your health care provider what activities are safe for you.  Take over-the-counter and prescription medicines only as told by your health  care provider.  If you smoke, do not smoke without supervision.  Keep all follow-up visits as told by your health care provider. This is important. Contact a health care provider if:  You have nausea or vomiting that does not get better with medicine.  You cannot eat or drink without vomiting.  You have pain that does not get better with medicine.  You are unable to pass urine.  You develop a skin rash.  You have a fever.  You have redness around your IV site that gets worse. Get help right away if:  You have difficulty breathing.  You have chest pain.  You have blood in your urine or stool, or you vomit blood. Summary  After the procedure, it is common to have a sore throat or nausea. It is also common to feel tired.  Have a responsible adult stay with you for the first 24 hours after general anesthesia. It is important to have someone help care for you until you are awake and alert.  When you feel hungry, start by eating small amounts of foods that are soft   and easy to digest (bland), such as toast. Gradually return to your regular diet.  Drink enough fluid to keep your urine pale yellow.  Return to your normal activities as told by your health care provider. Ask your health care provider what activities are safe for you. This information is not intended to replace advice given to you by your health care provider. Make sure you discuss any questions you have with your health care provider. Document Revised: 04/14/2017 Document Reviewed: 11/25/2016 Elsevier Patient Education  2020 Elsevier Inc.  

## 2019-07-08 NOTE — Transfer of Care (Signed)
Immediate Anesthesia Transfer of Care Note  Patient: Bianca [name]  Procedure(s) Performed: RIGHT CYSTOSCOPY/URETEROSCOPY/RIGHT RETROGRADE PYELOGRAM/HOLMIUM LASER/STENT EXCHANGE (Right Ureter)  Patient Location: PACU  Anesthesia Type:General  Level of Consciousness: awake  Airway & Oxygen Therapy: Patient Spontanous Breathing  Post-op Assessment: Report given to RN  Post vital signs: Reviewed  Last Vitals:  Vitals Value Taken Time  BP 162/106 07/08/19 1052  Temp    Pulse 94 07/08/19 1053  Resp 16 07/08/19 1053  SpO2 96 % 07/08/19 1053  Vitals shown include unvalidated device data.  Last Pain:  Vitals:   07/08/19 0913  TempSrc: Oral  PainSc: 0-No pain      Patients Stated Pain Goal: 7 (07/08/19 0913)  Complications: No apparent anesthesia complications

## 2019-07-08 NOTE — Interval H&P Note (Signed)
History and Physical Interval Note:  07/08/2019 9:16 AM  Bianca [name]  has presented today for surgery, with the diagnosis of Right ureteral calculus.  The various methods of treatment have been discussed with the patient and family. After consideration of risks, benefits and other options for treatment, the patient has consented to  Procedure(s): CYSTOSCOPY/URETEROSCOPY/HOLMIUM LASER/STENT EXCHANGE (Right) as a surgical intervention.  The patient's history has been reviewed, patient examined, no change in status, stable for surgery.  I have reviewed the patient's chart and labs.  Questions were answered to the patient's satisfaction.     Wilkie Aye

## 2019-07-08 NOTE — Op Note (Signed)
Preoperative diagnosis: Right ureteral stone  Postoperative diagnosis: Same  Procedure: 1 cystoscopy 2.  right retrograde pyelography 3.  Intraoperative fluoroscopy, under one hour, with interpretation 4.  Right ureteroscopic stone manipulation with laser lithotripsy 5.  Right 6 x 24 JJ stent exchange  Attending: Cleda Mccreedy  Anesthesia: General  Estimated blood loss: None  Drains: Right 6 x 24 JJ ureteral stent without tether  Specimens: stone for analysis  Antibiotics: ancef  Findings: Right impacted distal ureteral calculus. Moderate hydronephrosis  Indications: Patient is a 34 year old female with a history of ureteral stone and who has failed medical expulsive therapy.  After discussing treatment options, she decided proceed with right ureteroscopic stone manipulation.  Procedure her in detail: The patient was brought to the operating room and a brief timeout was done to ensure correct patient, correct procedure, correct site.  General anesthesia was administered patient was placed in dorsal lithotomy position.  Her genitalia was then prepped and draped in usual sterile fashion.  A rigid 22 French cystoscope was passed in the urethra and the bladder.  Bladder was inspected free masses or lesions.  the ureteral orifices were in the normal orthotopic locations. Using a grasper the right ureteral stent was brought to the urethral meatus. A zipwire was advanced through the stent and up to the renal pelvis. The stent was then removed. a 6 french ureteral catheter was then instilled into the right ureter orifice.  a gentle retrograde was obtained and findings noted above.   we then removed the cystoscope and cannulated the right ureteral orifice with a semirigid ureteroscope.  we then encountered the stone in the mid ureter.  using a 200 nm laser fiber and fragmented the stone into smaller pieces.  the pieces were then removed with a Ngage basket. Once all stone fragments were removed  we then placed a 6 x 24 double-j ureteral stent over the original zip wire. We then removed the wire and good coil was noted in the the renal pelvis under fluoroscopy and the bladder under direct vision.   the stone fragments were then removed from the bladder and sent for analysis.   the bladder was then drained and this concluded the procedure which was well tolerated by patient.  Complications: None  Condition: Stable, extubated, transferred to PACU  Plan: Patient is to be discharged home as to follow-up in one week for stent removal.

## 2019-07-09 NOTE — Addendum Note (Signed)
Addendum  created 07/09/19 1021 by Franco Nones, CRNA   Intraprocedure Staff edited

## 2019-07-15 LAB — CALCULI, WITH PHOTOGRAPH (CLINICAL LAB)
Calcium Oxalate Dihydrate: 20 %
Calcium Oxalate Monohydrate: 50 %
Hydroxyapatite: 30 %
Weight Calculi: 18 mg

## 2019-07-16 ENCOUNTER — Telehealth: Payer: Self-pay | Admitting: Urology

## 2019-07-16 ENCOUNTER — Encounter: Payer: Self-pay | Admitting: Urology

## 2019-07-16 ENCOUNTER — Other Ambulatory Visit: Payer: Self-pay

## 2019-07-16 ENCOUNTER — Ambulatory Visit: Payer: Managed Care, Other (non HMO) | Admitting: Urology

## 2019-07-16 VITALS — BP 149/91 | HR 76 | Temp 99.0°F | Ht 62.0 in | Wt 160.0 lb

## 2019-07-16 DIAGNOSIS — N201 Calculus of ureter: Secondary | ICD-10-CM | POA: Diagnosis not present

## 2019-07-16 LAB — POCT URINALYSIS DIPSTICK
Bilirubin, UA: NEGATIVE
Glucose, UA: NEGATIVE
Ketones, UA: NEGATIVE
Nitrite, UA: NEGATIVE
Protein, UA: POSITIVE — AB
Spec Grav, UA: 1.01 (ref 1.010–1.025)
Urobilinogen, UA: 0.2 E.U./dL
pH, UA: 6.5 (ref 5.0–8.0)

## 2019-07-16 MED ORDER — SULFAMETHOXAZOLE-TRIMETHOPRIM 800-160 MG PO TABS: 1.0000 | ORAL_TABLET | Freq: Two times a day (BID) | ORAL | 0 refills | Status: DC

## 2019-07-16 MED ORDER — GENTAMICIN SULFATE 40 MG/ML IJ SOLN
80.0000 mg | Freq: Once | INTRAMUSCULAR | Status: AC
  Administered 2019-07-16: 160 mg via INTRAMUSCULAR

## 2019-07-16 NOTE — Telephone Encounter (Signed)
Pt reports fever starting two days ago with increase flank and bladder pain. Pt taking tylenol for fever. Spoke with Dr. Ronne Binning who requested pt to be seen today. appt scheduled with Dr. Retta Diones. Pt voiced understanding.

## 2019-07-16 NOTE — Telephone Encounter (Signed)
Patient lm and states she is not feeling well following her surgery. She had a fever Sunday but not on Monday. She is having pain on her rt side. She wants to be advised on what to do.

## 2019-07-16 NOTE — Progress Notes (Signed)
Urological Symptom Review  Patient is experiencing the following symptoms: Frequent urination Burning/pain with urination Stream starts and stops Blood in urine Flank Pain  Review of Systems  Gastrointestinal (upper)  : Nausea  Gastrointestinal (lower) : Negative for lower GI symptoms  Constitutional : Fatigue  Skin: Negative for skin symptoms  Eyes: Negative for eye symptoms  Ear/Nose/Throat : Negative for Ear/Nose/Throat symptoms  Hematologic/Lymphatic: Negative for Hematologic/Lymphatic symptoms  Cardiovascular : Negative for cardiovascular symptoms  Respiratory : Negative for respiratory symptoms  Endocrine: Negative for endocrine symptoms  Musculoskeletal: Negative for musculoskeletal symptoms  Neurological: Negative for neurological symptoms  Psychologic: Negative for psychiatric symptoms

## 2019-07-16 NOTE — Progress Notes (Signed)
H&P  Chief Complaint: Ureteral Stone Surgery + Post-Op Fever  History of Present Illness:  3.23.2021: Bianca [name] is a 34 y.o. year old female presenting today with fever and pain/discomfort following recent cystoscopy/right ureteroscopy/right retrograde pyelogram and stent exchange. Her fever began last night and has been at its highest 100.5-- she has been on tylenol. She describes her pain is a constant pain in her right flank. This pain is worsened when she is about to urinate/urinating. This pain is producing nausea but she has not vomited from it yet. No bowel changes. She has not been given any abx thus far.   Past Medical History:  Diagnosis Date  . GERD (gastroesophageal reflux disease)   . History of kidney stones   . History of recurrent UTIs   . Hypertension   . Kidney stones     Past Surgical History:  Procedure Laterality Date  . CYSTOSCOPY WITH STENT PLACEMENT Right 06/14/2019   Procedure: Cystoscopy, Right Retrograde Pyelogram, Right Stent Placement;  Surgeon: Malen Gauze, MD;  Location: [facility];  Service: Urology;  Laterality: Right;  . CYSTOSCOPY/URETEROSCOPY/HOLMIUM LASER/STENT PLACEMENT Right 07/08/2019   Procedure: CYSTOSCOPY/RIGHT URETEROSCOPY/RIGHT RETROGRADE PYELOGRAM/STENT EXCHANGE;  Surgeon: Malen Gauze, MD;  Location: [facility];  Service: Urology;  Laterality: Right;  . HOLMIUM LASER APPLICATION Right 07/08/2019   Procedure: HOLMIUM LASER LITHOTRIPSY RIGHT URETERAL CALCULUS;  Surgeon: Malen Gauze, MD;  Location: [facility];  Service: Urology;  Laterality: Right;  . NO PAST SURGERIES      Home Medications:  (Not in a hospital admission)   Allergies:  Allergies  Allergen Reactions  . Codeine Hives and Shortness Of Breath    Tolerates hydromorphone    Family History  Problem Relation Age of Onset  . Heart disease Paternal Grandmother   . Cancer Paternal Grandmother        breast  . Thyroid disease Father   . Cancer Father        lung  .  Hodgkin's lymphoma Father   . Sudden Cardiac Death Father        Was not sure if was an MI or not.  Was during cancer treatment.  No autopsy done  . Kidney disease Mother        GN3N  . Other Maternal Grandmother        Guillian Barre syndrome    Social History:  reports that she has been smoking cigarettes. She has a 5.00 pack-year smoking history. She has never used smokeless tobacco. She reports that she does not drink alcohol or use drugs.  ROS: A complete review of systems was performed.  All systems are negative except for pertinent findings as noted.  Physical Exam:  Vital signs in last 24 hours: Temp:  [99 F (37.2 C)] 99 F (37.2 C) (03/23 1412) Pulse Rate:  [76] 76 (03/23 1412) BP: (149)/(91) 149/91 (03/23 1412) Weight:  [160 lb (72.6 kg)] 160 lb (72.6 kg) (03/23 1412) General:  Alert and oriented, No acute distress HEENT: Normocephalic, atraumatic Neck: No JVD or lymphadenopathy Cardiovascular: Regular rate  Lungs: Normal inspiratory/expiratory excursion Abdomen: Soft, RLQ Tenderness, nondistended, no abdominal masses, Right CVAT Back: No CVA tenderness Extremities: No edema Neurologic: Grossly intact  Laboratory Data:  Results for orders placed or performed in visit on 07/16/19 (from the past 24 hour(s))  POCT urinalysis dipstick     Status: Abnormal   Collection Time: 07/16/19  2:19 PM  Result Value Ref Range   Color, UA light red  Clarity, UA     Glucose, UA Negative Negative   Bilirubin, UA neg    Ketones, UA neg    Spec Grav, UA 1.010 1.010 - 1.025   Blood, UA +++    pH, UA 6.5 5.0 - 8.0   Protein, UA Positive (A) Negative   Urobilinogen, UA 0.2 0.2 or 1.0 E.U./dL   Nitrite, UA neg    Leukocytes, UA Trace (A) Negative   Appearance hazy    Odor      I have reviewed prior pt notes  I have reviewed notes from referring/previous physicians  I have reviewed urinalysis results   Impression/Assessment:  UA today does not seem to indicate  infection -- will culture to rule this out. She is due to have her stent removed tomorrow -- I think this would help her current pain.  Plan:  1. Urine culture -- will change Rx if needed.   2. Her stent is scheduled to be removed at her appointment tomorrow -- keep this.   3. Start on Sulfa, gent given  Dena Billet 07/16/2019, 2:37 PM  Lillette Boxer. Ottie Neglia MD

## 2019-07-17 ENCOUNTER — Ambulatory Visit: Payer: Managed Care, Other (non HMO) | Admitting: Urology

## 2019-07-17 ENCOUNTER — Ambulatory Visit (INDEPENDENT_AMBULATORY_CARE_PROVIDER_SITE_OTHER): Payer: Managed Care, Other (non HMO) | Admitting: Urology

## 2019-07-17 VITALS — BP 137/86 | HR 93 | Temp 98.2°F | Ht 62.0 in | Wt 160.0 lb

## 2019-07-17 DIAGNOSIS — N3000 Acute cystitis without hematuria: Secondary | ICD-10-CM | POA: Insufficient documentation

## 2019-07-17 DIAGNOSIS — N201 Calculus of ureter: Secondary | ICD-10-CM

## 2019-07-17 MED ORDER — SULFAMETHOXAZOLE-TRIMETHOPRIM 800-160 MG PO TABS: 1.0000 | ORAL_TABLET | Freq: Two times a day (BID) | ORAL | 0 refills | Status: DC

## 2019-07-17 NOTE — Progress Notes (Signed)
Patient rescheduled due to fever

## 2019-07-17 NOTE — Progress Notes (Signed)
Urological Symptom Review ° °Patient is experiencing the following symptoms: °Frequent urination °Burning/pain with urination °Blood in urine ° ° °Review of Systems ° °Gastrointestinal (upper)  : °Negative for upper GI symptoms ° °Gastrointestinal (lower) : °Negative for lower GI symptoms ° °Constitutional : °Negative for symptoms ° °Skin: °Negative for skin symptoms ° °Eyes: °Negative for eye symptoms ° °Ear/Nose/Throat : °Negative for Ear/Nose/Throat symptoms ° °Hematologic/Lymphatic: °Negative for Hematologic/Lymphatic symptoms ° °Cardiovascular : °Negative for cardiovascular symptoms ° °Respiratory : °Negative for respiratory symptoms ° °Endocrine: °Negative for endocrine symptoms ° °Musculoskeletal: °Negative for musculoskeletal symptoms ° °Neurological: °Negative for neurological symptoms ° °Psychologic: °Negative for psychiatric symptoms ° °

## 2019-07-18 ENCOUNTER — Telehealth: Payer: Self-pay | Admitting: Urology

## 2019-07-18 NOTE — Telephone Encounter (Signed)
Pt requests nurse return her call regarding stent removal next week.

## 2019-07-19 NOTE — Telephone Encounter (Signed)
Pt called asking if she would be able to return to work after stent is removed. Informed pt she should be able to resume working and can confirm with MD at day of appt for stent removal.

## 2019-07-24 ENCOUNTER — Ambulatory Visit (INDEPENDENT_AMBULATORY_CARE_PROVIDER_SITE_OTHER): Payer: Managed Care, Other (non HMO) | Admitting: Urology

## 2019-07-24 ENCOUNTER — Other Ambulatory Visit: Payer: Self-pay

## 2019-07-24 ENCOUNTER — Ambulatory Visit: Payer: Managed Care, Other (non HMO) | Admitting: Urology

## 2019-07-24 VITALS — BP 158/113 | HR 91 | Temp 98.2°F

## 2019-07-24 DIAGNOSIS — N201 Calculus of ureter: Secondary | ICD-10-CM | POA: Diagnosis not present

## 2019-07-24 LAB — POCT URINALYSIS DIPSTICK
Bilirubin, UA: NEGATIVE
Glucose, UA: NEGATIVE
Ketones, UA: NEGATIVE
Nitrite, UA: NEGATIVE
Protein, UA: POSITIVE — AB
Spec Grav, UA: 1.025 (ref 1.010–1.025)
Urobilinogen, UA: 0.2 E.U./dL
pH, UA: 7 (ref 5.0–8.0)

## 2019-07-24 MED ORDER — CIPROFLOXACIN HCL 500 MG PO TABS
500.0000 mg | ORAL_TABLET | Freq: Once | ORAL | Status: AC
  Administered 2019-07-24: 500 mg via ORAL

## 2019-07-24 NOTE — Progress Notes (Signed)
Urological Symptom Review  Patient is experiencing the following symptoms: none  Review of Systems  Gastrointestinal (upper)  : Negative for upper GI symptoms  Gastrointestinal (lower)  negative  Constitutional : Negative for symptoms  Skin: Negative for skin symptoms  Eyes: Negative for eye symptoms  Ear/Nose/Throat : Negative for Ear/Nose/Throat symptoms  Hematologic/Lymphatic: Negative for Hematologic/Lymphatic symptoms  Cardiovascular : Negative for cardiovascular symptoms  Respiratory : Negative for respiratory symptoms  Endocrine: Negative for endocrine symptoms  Musculoskeletal: Negative for musculoskeletal symptoms  Neurological: Negative for neurological symptoms  Psychologic: Negative for psychiatric symptoms  

## 2019-07-24 NOTE — Progress Notes (Signed)
   07/24/19  CC: right flank pain  HPI: Ms Bianca [name] is a 33yo with a hx of nephrolithiasis who underwent right ureteroscopic stone extraction 3/15. She is here for stent removal  Blood pressure (!) 158/113, pulse 91, temperature 98.2 F (36.8 C), last menstrual period 06/26/2019. NED. A&Ox3.   No respiratory distress   Abd soft, NT, ND Normal external genitalia with patent urethral meatus  Cystoscopy Procedure Note  Patient identification was confirmed, informed consent was obtained, and patient was prepped using Betadine solution.  Lidocaine jelly was administered per urethral meatus.    Procedure: - Flexible cystoscope introduced, without any difficulty.   - Thorough search of the bladder revealed:    normal urethral meatus    normal urothelium    no stones    no ulcers     no tumors    no urethral polyps    no trabeculation  - Ureteral orifices were normal in position and appearance.  RIGHT URETERAL STENT REMOVED INTACT  Post-Procedure: - Patient tolerated the procedure well  Assessment/ Plan: -Followup 6 weeks with renal US   No follow-ups on file.  Wilkie Aye, MD

## 2019-07-25 ENCOUNTER — Telehealth: Payer: Self-pay | Admitting: Urology

## 2019-07-25 NOTE — Telephone Encounter (Signed)
Patient called and states she had stent removed yesterday and she is feeling sick today and having issues. I told her I don't have a doctor or nurse available today and that she could call [facility] for the urologist on call or go to the ER. Pt stated she will call [facility] first and go from there.

## 2019-07-31 ENCOUNTER — Encounter: Payer: Self-pay | Admitting: Urology

## 2019-07-31 NOTE — Patient Instructions (Signed)
Dietary Guidelines to Help Prevent Kidney Stones Kidney stones are deposits of minerals and salts that form inside your kidneys. Your risk of developing kidney stones may be greater depending on your diet, your lifestyle, the medicines you take, and whether you have certain medical conditions. Most people can reduce their chances of developing kidney stones by following the instructions below. Depending on your overall health and the type of kidney stones you tend to develop, your dietitian may give you more specific instructions. What are tips for following this plan? Reading food labels  Choose foods with "no salt added" or "low-salt" labels. Limit your sodium intake to less than 1500 mg per day.  Choose foods with calcium for each meal and snack. Try to eat about 300 mg of calcium at each meal. Foods that contain 200-500 mg of calcium per serving include: ? 8 oz (237 ml) of milk, fortified nondairy milk, and fortified fruit juice. ? 8 oz (237 ml) of kefir, yogurt, and soy yogurt. ? 4 oz (118 ml) of tofu. ? 1 oz of cheese. ? 1 cup (300 g) of dried figs. ? 1 cup (91 g) of cooked broccoli. ? 1-3 oz can of sardines or mackerel.  Most people need 1000 to 1500 mg of calcium each day. Talk to your dietitian about how much calcium is recommended for you. Shopping  Buy plenty of fresh fruits and vegetables. Most people do not need to avoid fruits and vegetables, even if they contain nutrients that may contribute to kidney stones.  When shopping for convenience foods, choose: ? Whole pieces of fruit. ? Premade salads with dressing on the side. ? Low-fat fruit and yogurt smoothies.  Avoid buying frozen meals or prepared deli foods.  Look for foods with live cultures, such as yogurt and kefir. Cooking  Do not add salt to food when cooking. Place a salt shaker on the table and allow each person to add his or her own salt to taste.  Use vegetable protein, such as beans, textured vegetable  protein (TVP), or tofu instead of meat in pasta, casseroles, and soups. Meal planning   Eat less salt, if told by your dietitian. To do this: ? Avoid eating processed or premade food. ? Avoid eating fast food.  Eat less animal protein, including cheese, meat, poultry, or fish, if told by your dietitian. To do this: ? Limit the number of times you have meat, poultry, fish, or cheese each week. Eat a diet free of meat at least 2 days a week. ? Eat only one serving each day of meat, poultry, fish, or seafood. ? When you prepare animal protein, cut pieces into small portion sizes. For most meat and fish, one serving is about the size of one deck of cards.  Eat at least 5 servings of fresh fruits and vegetables each day. To do this: ? Keep fruits and vegetables on hand for snacks. ? Eat 1 piece of fruit or a handful of berries with breakfast. ? Have a salad and fruit at lunch. ? Have two kinds of vegetables at dinner.  Limit foods that are high in a substance called oxalate. These include: ? Spinach. ? Rhubarb. ? Beets. ? Potato chips and french fries. ? Nuts.  If you regularly take a diuretic medicine, make sure to eat at least 1-2 fruits or vegetables high in potassium each day. These include: ? Avocado. ? Banana. ? Orange, prune, carrot, or tomato juice. ? Baked potato. ? Cabbage. ? Beans and split   peas. General instructions   Drink enough fluid to keep your urine clear or pale yellow. This is the most important thing you can do.  Talk to your health care provider and dietitian about taking daily supplements. Depending on your health and the cause of your kidney stones, you may be advised: ? Not to take supplements with vitamin C. ? To take a calcium supplement. ? To take a daily probiotic supplement. ? To take other supplements such as magnesium, fish oil, or vitamin B6.  Take all medicines and supplements as told by your health care provider.  Limit alcohol intake to no  more than 1 drink a day for nonpregnant women and 2 drinks a day for men. One drink equals 12 oz of beer, 5 oz of wine, or 1 oz of hard liquor.  Lose weight if told by your health care provider. Work with your dietitian to find strategies and an eating plan that works best for you. What foods are not recommended? Limit your intake of the following foods, or as told by your dietitian. Talk to your dietitian about specific foods you should avoid based on the type of kidney stones and your overall health. Grains Breads. Bagels. Rolls. Baked goods. Salted crackers. Cereal. Pasta. Vegetables Spinach. Rhubarb. Beets. Canned vegetables. Pickles. Olives. Meats and other protein foods Nuts. Nut butters. Large portions of meat, poultry, or fish. Salted or cured meats. Deli meats. Hot dogs. Sausages. Dairy Cheese. Beverages Regular soft drinks. Regular vegetable juice. Seasonings and other foods Seasoning blends with salt. Salad dressings. Canned soups. Soy sauce. Ketchup. Barbecue sauce. Canned pasta sauce. Casseroles. Pizza. Lasagna. Frozen meals. Potato chips. French fries. Summary  You can reduce your risk of kidney stones by making changes to your diet.  The most important thing you can do is drink enough fluid. You should drink enough fluid to keep your urine clear or pale yellow.  Ask your health care provider or dietitian how much protein from animal sources you should eat each day, and also how much salt and calcium you should have each day. This information is not intended to replace advice given to you by your health care provider. Make sure you discuss any questions you have with your health care provider. Document Revised: 08/01/2018 Document Reviewed: 03/22/2016 Elsevier Patient Education  2020 [employer]  

## 2019-09-05 ENCOUNTER — Ambulatory Visit (HOSPITAL_COMMUNITY): Payer: Managed Care, Other (non HMO)

## 2019-09-09 ENCOUNTER — Ambulatory Visit: Payer: Managed Care, Other (non HMO) | Admitting: Urology

## 2020-09-22 ENCOUNTER — Emergency Department (HOSPITAL_COMMUNITY)
Admission: EM | Admit: 2020-09-22 | Discharge: 2020-09-22 | Disposition: A | Discharge status: Home / Self Care | Payer: Managed Care, Other (non HMO) | Attending: Emergency Medicine | Admitting: Emergency Medicine

## 2020-09-22 ENCOUNTER — Other Ambulatory Visit: Payer: Self-pay

## 2020-09-22 ENCOUNTER — Encounter (HOSPITAL_COMMUNITY): Payer: Self-pay | Admitting: Emergency Medicine

## 2020-09-22 DIAGNOSIS — F17210 Nicotine dependence, cigarettes, uncomplicated: Secondary | ICD-10-CM | POA: Diagnosis not present

## 2020-09-22 DIAGNOSIS — Z955 Presence of coronary angioplasty implant and graft: Secondary | ICD-10-CM | POA: Diagnosis not present

## 2020-09-22 DIAGNOSIS — N23 Unspecified renal colic: Secondary | ICD-10-CM

## 2020-09-22 DIAGNOSIS — N130 Hydronephrosis with ureteropelvic junction obstruction: Secondary | ICD-10-CM | POA: Insufficient documentation

## 2020-09-22 DIAGNOSIS — I10 Essential (primary) hypertension: Secondary | ICD-10-CM | POA: Diagnosis not present

## 2020-09-22 DIAGNOSIS — Z79899 Other long term (current) drug therapy: Secondary | ICD-10-CM | POA: Diagnosis not present

## 2020-09-22 DIAGNOSIS — R109 Unspecified abdominal pain: Secondary | ICD-10-CM | POA: Diagnosis present

## 2020-09-22 DIAGNOSIS — N201 Calculus of ureter: Secondary | ICD-10-CM

## 2020-09-22 LAB — CBC
HCT: 42.9 % (ref 36.0–46.0)
Hemoglobin: 14.6 g/dL (ref 12.0–15.0)
MCH: 29.6 pg (ref 26.0–34.0)
MCHC: 34 g/dL (ref 30.0–36.0)
MCV: 87 fL (ref 80.0–100.0)
Platelets: 334 10*3/uL (ref 150–400)
RBC: 4.93 MIL/uL (ref 3.87–5.11)
RDW: 12.7 % (ref 11.5–15.5)
WBC: 16.4 10*3/uL — ABNORMAL HIGH (ref 4.0–10.5)
nRBC: 0 % (ref 0.0–0.2)

## 2020-09-22 LAB — BASIC METABOLIC PANEL
Anion gap: 14 (ref 5–15)
BUN: 11 mg/dL (ref 6–20)
CO2: 26 mmol/L (ref 22–32)
Calcium: 8.6 mg/dL — ABNORMAL LOW (ref 8.9–10.3)
Chloride: 97 mmol/L — ABNORMAL LOW (ref 98–111)
Creatinine, Ser: 1.18 mg/dL — ABNORMAL HIGH (ref 0.44–1.00)
GFR, Estimated: >60 mL/min (ref >60)
Glucose, Bld: 125 mg/dL — ABNORMAL HIGH (ref 70–99)
Potassium: 3.7 mmol/L (ref 3.5–5.1)
Sodium: 137 mmol/L (ref 135–145)

## 2020-09-22 LAB — URINALYSIS, ROUTINE W REFLEX MICROSCOPIC
Bilirubin Urine: NEGATIVE
Glucose, UA: NEGATIVE mg/dL
Ketones, ur: 5 mg/dL — AB
Nitrite: NEGATIVE
Protein, ur: NEGATIVE mg/dL
Specific Gravity, Urine: 1.014 (ref 1.005–1.030)
pH: 8 (ref 5.0–8.0)

## 2020-09-22 LAB — I-STAT BETA HCG BLOOD, ED (MC, WL, AP ONLY): I-stat hCG, quantitative: <5 m[IU]/mL (ref <5)

## 2020-09-22 MED ORDER — HYDROMORPHONE HCL 1 MG/ML IJ SOLN
1.0000 mg | Freq: Once | INTRAMUSCULAR | Status: AC
  Administered 2020-09-22: 1 mg via INTRAVENOUS
  Filled 2020-09-22: qty 1

## 2020-09-22 MED ORDER — SODIUM CHLORIDE 0.9 % IV SOLN
1000.0000 mL | INTRAVENOUS | Status: DC
  Administered 2020-09-22: 1000 mL via INTRAVENOUS

## 2020-09-22 MED ORDER — ONDANSETRON HCL 4 MG/2ML IJ SOLN
4.0000 mg | Freq: Once | INTRAMUSCULAR | Status: AC
  Administered 2020-09-22: 4 mg via INTRAVENOUS
  Filled 2020-09-22: qty 2

## 2020-09-22 MED ORDER — KETOROLAC TROMETHAMINE 15 MG/ML IJ SOLN
15.0000 mg | Freq: Once | INTRAMUSCULAR | Status: AC
  Administered 2020-09-22: 15 mg via INTRAVENOUS
  Filled 2020-09-22: qty 1

## 2020-09-22 MED ORDER — OXYCODONE-ACETAMINOPHEN 5-325 MG PO TABS
2.0000 | ORAL_TABLET | Freq: Once | ORAL | Status: AC
  Administered 2020-09-22: 2 via ORAL
  Filled 2020-09-22: qty 2

## 2020-09-22 MED ORDER — PROMETHAZINE HCL 25 MG PO TABS: 25.0000 mg | ORAL_TABLET | Freq: Four times a day (QID) | ORAL | 0 refills | Status: DC | PRN

## 2020-09-22 MED ORDER — TAMSULOSIN HCL 0.4 MG PO CAPS
0.4000 mg | ORAL_CAPSULE | Freq: Once | ORAL | Status: AC
  Administered 2020-09-22: 0.4 mg via ORAL
  Filled 2020-09-22: qty 1

## 2020-09-22 MED ORDER — SODIUM CHLORIDE 0.9 % IV BOLUS (SEPSIS)
1000.0000 mL | Freq: Once | INTRAVENOUS | Status: AC
  Administered 2020-09-22: 1000 mL via INTRAVENOUS

## 2020-09-22 MED ORDER — PROMETHAZINE HCL 25 MG PO TABS
25.0000 mg | ORAL_TABLET | Freq: Once | ORAL | Status: AC
  Administered 2020-09-22: 25 mg via ORAL
  Filled 2020-09-22: qty 1

## 2020-09-22 NOTE — ED Provider Notes (Signed)
8:16 AM Patient's pain has improved but is still present and pain is not fully controlled.  Given the ability to tolerate oral pain meds as part of the problem, I will try Phenergan instead of Zofran and then try oral pain medicine to see if it last longer than the IV.  However we discussed if this does not work she may need emergent urologic intervention, which she is hoping to avoid.  9:54 AM Patient has been sleeping. She's feeling much better. Will give urology follow up and prescribe phenergan as this seemed to help. D/c with return precautions.   Pricilla Loveless, MD 09/22/20 [phone]

## 2020-09-22 NOTE — Discharge Instructions (Addendum)
If you develop worsening, continued, or recurrent abdominal pain, uncontrolled vomiting, fever, chest or back pain, or any other new/concerning symptoms then return to the ER for evaluation.  

## 2020-09-22 NOTE — ED Triage Notes (Addendum)
Pt c/o worsening right sided flank pain. Tenderness.  States diagnosed with kidney Stones yesterday. Ongoing nausea with decreased intake  States having to have surgical intervention in the past to remove stones.

## 2020-09-22 NOTE — ED Provider Notes (Signed)
[facility] EMERGENCY DEPARTMENT Provider Note  CSN: [id number] Arrival date & time: 09/22/20 0510  Chief Complaint(s) Flank Pain  HPI Bianca [name] is a 35 y.o. female with a past medical history listed below with a known 5 mm obstructing ureteral stone noted and [facility] emergency department yesterday here for intractable nausea and vomiting and worsening right flank pain.  Patient reports that she is unable to keep her medication down.  Pain is severe and stabbing.  Worse with palpation, emesis, and movement.  No alleviating factors.  No chest pain or shortness of breath. No fevers.  HPI  Past Medical History Past Medical History:  Diagnosis Date  . GERD (gastroesophageal reflux disease)   . History of kidney stones   . History of recurrent UTIs   . Hypertension   . Kidney stones    Patient Active Problem List   Diagnosis Date Noted  . Acute cystitis without hematuria 07/17/2019  . Ureteral calculus 06/13/2019  . Essential hypertension 07/30/2018  . Cellulitis 07/28/2018  . Cat bite of finger 07/27/2018 07/28/2018  . Heart palpitations 05/26/2017  . Chest discomfort 05/26/2017  . Dyspareunia, female 09/06/2016  . Dysmenorrhea 09/06/2016  . Menorrhagia with regular cycle 09/06/2016  . Postpartum hypertension, resolved 08/18/2014  . NSVD (normal spontaneous vaginal delivery) 05/30/2014  . Preeclampsia, severe 05/29/2014  . Right nephrolithiasis 03/17/2014  . Hydronephrosis determined by ultrasound 03/16/2014  . Hydronephrosis of right kidney 03/15/2014  . Benign essential hypertension antepartum 10/22/2013  . Smoker 10/22/2013  . History of gestational hypertension 10/22/2013   Home Medication(s) Prior to Admission medications   Medication Sig Start Date End Date Taking? Authorizing Provider  albuterol (VENTOLIN HFA) 108 (90 Base) MCG/ACT inhaler Inhale 2 puffs into the lungs every 4 (four) hours as needed for wheezing or shortness of breath. 09/01/20  Yes  [provider]  amLODipine (NORVASC) 5 MG tablet Take 1 tablet (5 mg total) by mouth daily. 07/31/18  Yes Erick Blinks, MD  benzonatate (TESSALON) 200 MG capsule Take 200 mg by mouth 3 (three) times daily as needed. 09/01/20  Yes [provider]  cephALEXin (KEFLEX) 500 MG capsule Take 1,000 mg by mouth See admin instructions. Bid x 7 days 05/06/20  Yes [provider]  fluconazole (DIFLUCAN) 150 MG tablet Take 150 mg by mouth See admin instructions. Qd x 2 days 05/06/20  Yes [provider]  Ibuprofen (MOTRIN PO) Take 1 tablet by mouth every 8 (eight) hours as needed (pain).   Yes [provider]  loratadine (CLARITIN) 10 MG tablet Take 10 mg by mouth daily.   Yes [provider]  nicotine (NICODERM CQ - DOSED IN MG/24 HOURS) 14 mg/24hr patch Place 14 mg onto the skin daily.   Yes [provider]  ondansetron (ZOFRAN-ODT) 4 MG disintegrating tablet Take 4 mg by mouth every 4 (four) hours as needed for nausea or vomiting. 09/18/20  Yes [provider]  oxyCODONE-acetaminophen (PERCOCET) 7.5-325 MG tablet Take 1 tablet by mouth every 6 (six) hours as needed for moderate pain or severe pain. 09/18/20  Yes [provider]  tamsulosin (FLOMAX) 0.4 MG CAPS capsule Take 0.4 mg by mouth daily as needed (renalcolic).   Yes [provider]  amoxicillin-clavulanate (AUGMENTIN) 875-125 MG tablet Take 1 tablet by mouth See admin instructions. Bid x 7 days Patient not taking: No sig reported 09/01/20   [provider]  sulfamethoxazole-trimethoprim (BACTRIM DS) 800-160 MG tablet Take 1 tablet by mouth 2 (two) times  daily. Patient not taking: No sig reported 07/17/19   Malen Gauze, MD                                                                                                                                    Past Surgical History Past Surgical History:  Procedure Laterality Date  . CYSTOSCOPY WITH STENT  PLACEMENT Right 06/14/2019   Procedure: Cystoscopy, Right Retrograde Pyelogram, Right Stent Placement;  Surgeon: Malen Gauze, MD;  Location: [facility];  Service: Urology;  Laterality: Right;  . CYSTOSCOPY/URETEROSCOPY/HOLMIUM LASER/STENT PLACEMENT Right 07/08/2019   Procedure: CYSTOSCOPY/RIGHT URETEROSCOPY/RIGHT RETROGRADE PYELOGRAM/STENT EXCHANGE;  Surgeon: Malen Gauze, MD;  Location: [facility];  Service: Urology;  Laterality: Right;  . HOLMIUM LASER APPLICATION Right 07/08/2019   Procedure: HOLMIUM LASER LITHOTRIPSY RIGHT URETERAL CALCULUS;  Surgeon: Malen Gauze, MD;  Location: [facility];  Service: Urology;  Laterality: Right;  . NO PAST SURGERIES     Family History Family History  Problem Relation Age of Onset  . Heart disease Paternal Grandmother   . Cancer Paternal Grandmother        breast  . Thyroid disease Father   . Cancer Father        lung  . Hodgkin's lymphoma Father   . Sudden Cardiac Death Father        Was not sure if was an MI or not.  Was during cancer treatment.  No autopsy done  . Kidney disease Mother        GN3N  . Other Maternal Grandmother        Guillian Barre syndrome    Social History Social History   Tobacco Use  . Smoking status: Current Every Day Smoker    Packs/day: 0.50    Years: 10.00    Pack years: 5.00    Types: Cigarettes    Last attempt to quit: 03/25/2014    Years since quitting: 6.5  . Smokeless tobacco: Never Used  Vaping Use  . Vaping Use: Never used  Substance Use Topics  . Alcohol use: No  . Drug use: No   Allergies Codeine  Review of Systems Review of Systems All other systems are reviewed and are negative for acute change except as noted in the HPI  Physical Exam Vital Signs  I have reviewed the triage vital signs BP (!) 183/114 (BP Location: Right Arm)   Pulse (!) 101   Temp (!) 97.4 F (36.3 C) (Oral)   Resp (!) 25   Ht 5\' 2"  (1.575 m)   Wt 72.6 kg   LMP 09/12/2020   SpO2 98%   BMI 29.27 kg/m    Physical Exam Vitals reviewed.  Constitutional:      General: She is not in acute distress.    Appearance: She is well-developed. She is not diaphoretic.  HENT:     Head: Normocephalic and atraumatic.     Right Ear: External  ear normal.     Left Ear: External ear normal.     Nose: Nose normal.  Eyes:     General: No scleral icterus.    Conjunctiva/sclera: Conjunctivae normal.  Neck:     Trachea: Phonation normal.  Cardiovascular:     Rate and Rhythm: Normal rate and regular rhythm.  Pulmonary:     Effort: Pulmonary effort is normal. No respiratory distress.     Breath sounds: No stridor.  Abdominal:     General: There is no distension.     Tenderness: There is right CVA tenderness.  Musculoskeletal:        General: Normal range of motion.     Cervical back: Normal range of motion.  Neurological:     Mental Status: She is alert and oriented to person, place, and time.  Psychiatric:        Behavior: Behavior normal.     ED Results and Treatments Labs (all labs ordered are listed, but only abnormal results are displayed) Labs Reviewed  URINALYSIS, ROUTINE W REFLEX MICROSCOPIC - Abnormal; Notable for the following components:      Result Value   APPearance HAZY (*)    Hgb urine dipstick MODERATE (*)    Ketones, ur 5 (*)    Leukocytes,Ua TRACE (*)    Bacteria, UA RARE (*)    All other components within normal limits  CBC - Abnormal; Notable for the following components:   WBC 16.4 (*)    All other components within normal limits  BASIC METABOLIC PANEL - Abnormal; Notable for the following components:   Chloride 97 (*)    Glucose, Bld 125 (*)    Creatinine, Ser 1.18 (*)    Calcium 8.6 (*)    All other components within normal limits  I-STAT BETA HCG BLOOD, ED ([facility], [facility], [facility] ONLY)                                                                                                                         EKG  EKG Interpretation  Date/Time:    Ventricular Rate:    PR  Interval:    QRS Duration:   QT Interval:    QTC Calculation:   R Axis:     Text Interpretation:        Radiology No results found.  Pertinent labs & imaging results that were available during my care of the patient were reviewed by me and considered in my medical decision making (see chart for details).  Medications Ordered in ED Medications  sodium chloride 0.9 % bolus 1,000 mL (1,000 mLs Intravenous New Bag/Given 09/22/20 0619)    Followed by  0.9 %  sodium chloride infusion (has no administration in time range)  tamsulosin (FLOMAX) capsule 0.4 mg (has no administration in time range)  ketorolac (TORADOL) 15 MG/ML injection 15 mg (15 mg Intravenous Given 09/22/20 0624)  HYDROmorphone (DILAUDID) injection 1 mg (1 mg Intravenous Given 09/22/20 0624)  ondansetron (ZOFRAN) injection 4  mg (4 mg Intravenous Given 09/22/20 1517)                                                                                                                                    Procedures Ultrasound ED Renal  Date/Time: 09/22/2020 6:41 AM Performed by: Nira Conn, MD Authorized by: Nira Conn, MD   Procedure details:    Indications: hydronephrosis     Technique:  L kidney and R kidneyImages: archived Left kidney findings:    Hydronephrosis: none   Right kidney findings:    Hydronephrosis: severe      (including critical care time)  Medical Decision Making / ED Course I have reviewed the nursing notes for this encounter and the patient's prior records (if available in EHR or on provided paperwork).   Rayssa Keil was evaluated in Emergency Department on 09/22/2020 for the symptoms described in the history of present illness. She was evaluated in the context of the global COVID-19 pandemic, which necessitated consideration that the patient might be at risk for infection with the SARS-CoV-2 virus that causes COVID-19. Institutional protocols and algorithms that pertain to the  evaluation of patients at risk for COVID-19 are in a state of rapid change based on information released by regulatory bodies including the CDC and federal and state organizations. These policies and algorithms were followed during the patient's care in the ED.  Known right ureteral stone. Patient on antibiotics for possible renal infection. Unable to tolerate oral intake despite nausea medicine at home. Right flank pain.  Otherwise abdomen benign. Labs notable for slightly worsening leukocytosis from yesterday. UA with hematuria and trace leukocytes but no white count and rare bacteria.  Likely more inflammatory than infectious. Renal function with mild AKI. Bedside ultrasound notable for large hydronephrosis.  Patient started on IV fluids, given IV pain and nausea medicine.  Plan to reassess and PO challenge. Urology consult for stent if needed.  Patient care turned over to Dr Criss Alvine. Patient case and results discussed in detail; please see their note for further ED managment.       Final Clinical Impression(s) / ED Diagnoses Final diagnoses:  Obstruction of left ureteropelvic junction due to stone      This chart was dictated using voice recognition software.  Despite best efforts to proofread,  errors can occur which can change the documentation meaning.   Nira Conn, MD 09/22/20 [phone]

## 2021-04-23 IMAGING — CT CT RENAL STONE PROTOCOL
2 of 3 series · 16 of 44 positions shown, 18 images · non-contrast
Comparison: CT 06/13/2019

CLINICAL DATA: Right flank pain with nausea and vomiting history
stent placement 06/14/2019

EXAM:
CT ABDOMEN AND PELVIS WITHOUT CONTRAST
TECHNIQUE: Multidetector CT imaging of the abdomen and pelvis was performed
following the standard protocol without IV contrast.

[Series 3: lung bases · axial · 0.61mm/px · z∈[-190,-140]mm · 13 of 30 slices shown, 15 images]
[im 3/30  soft-tissue]
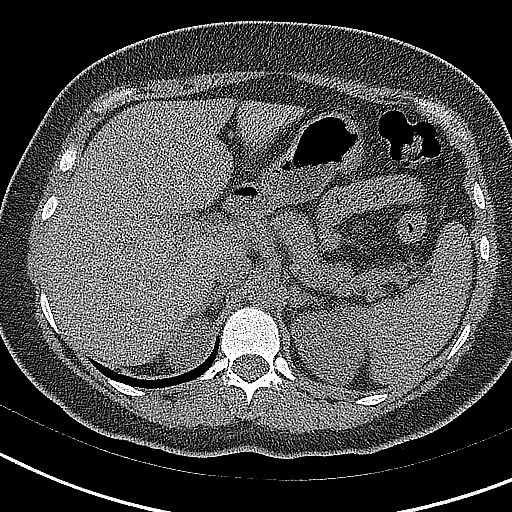
[im 3/30  bone]
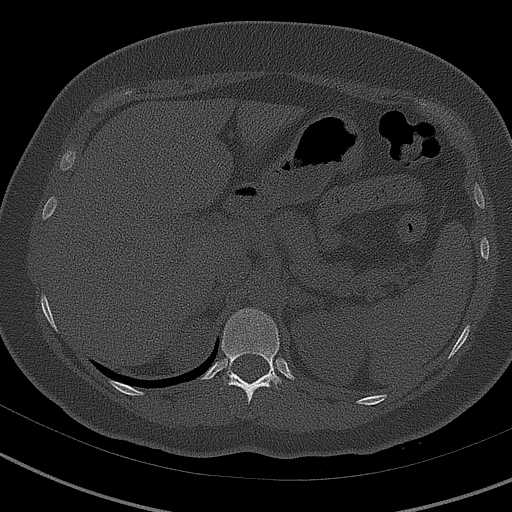
[im 5/30  soft-tissue]
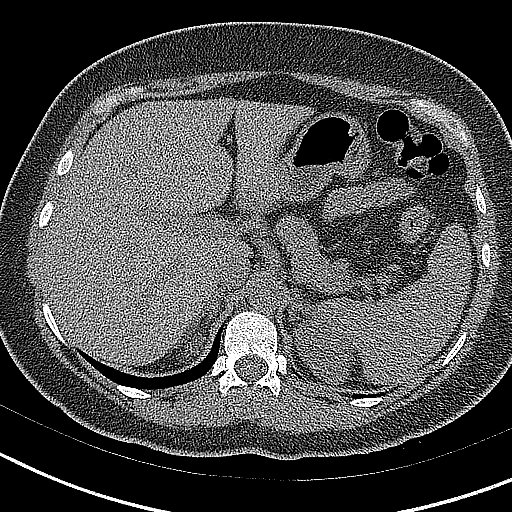
[im 7/30  soft-tissue]
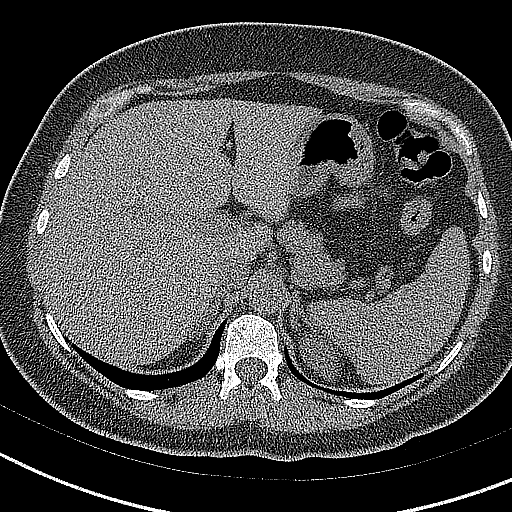
[im 9/30  soft-tissue]
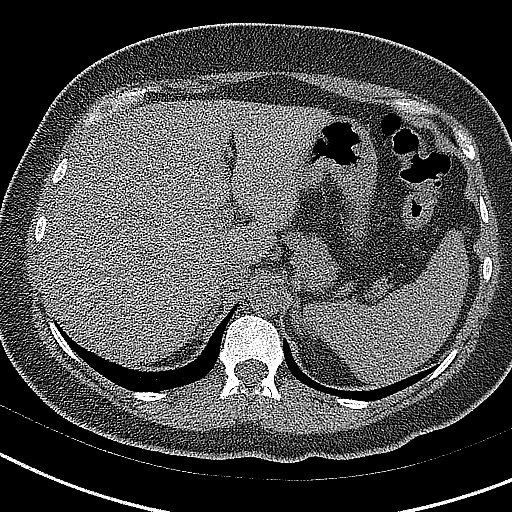
[im 11/30  soft-tissue]
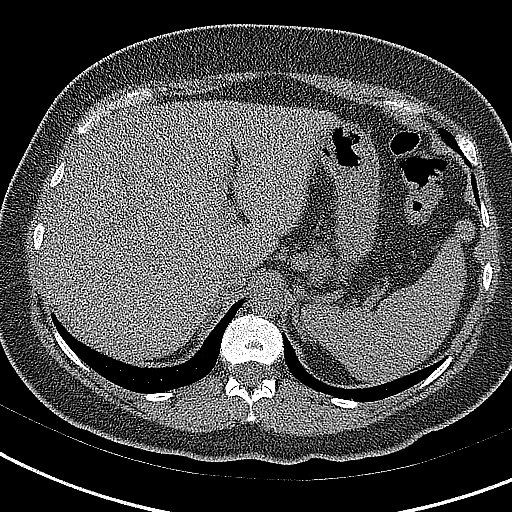
[im 13/30  soft-tissue]
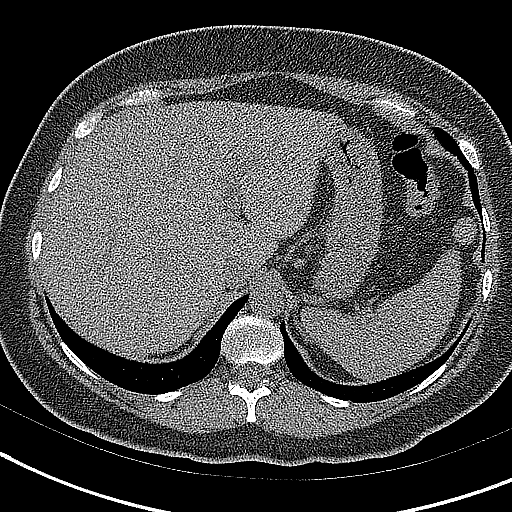
[im 16/30  soft-tissue]
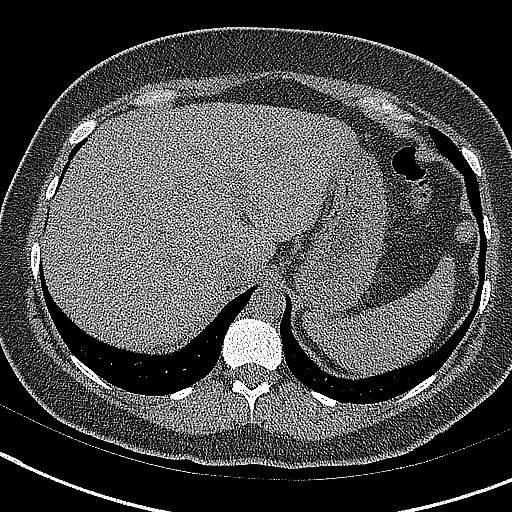
[im 18/30  soft-tissue]
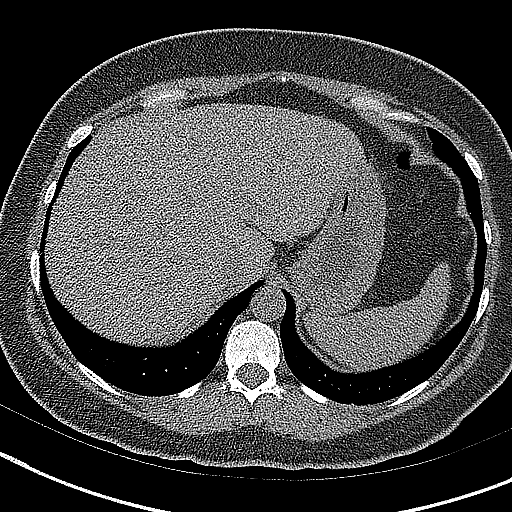
[im 20/30  soft-tissue]
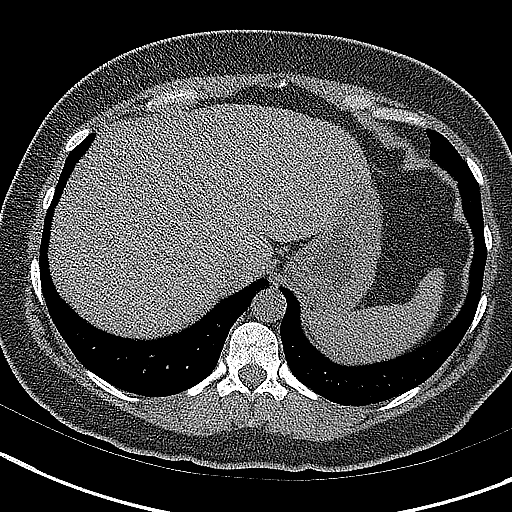
[im 20/30  bone]
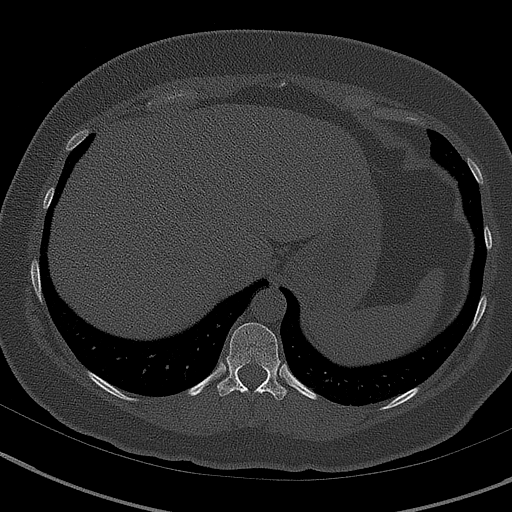
[im 22/30  soft-tissue]
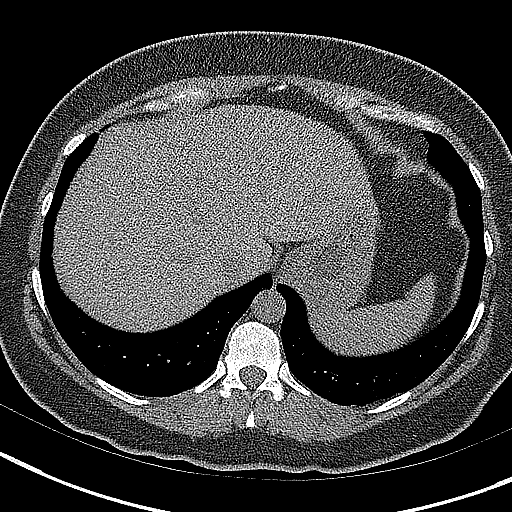
[im 24/30  soft-tissue]
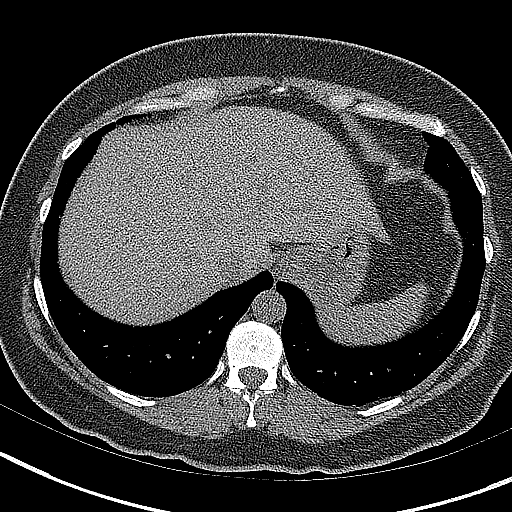
[im 26/30  soft-tissue]
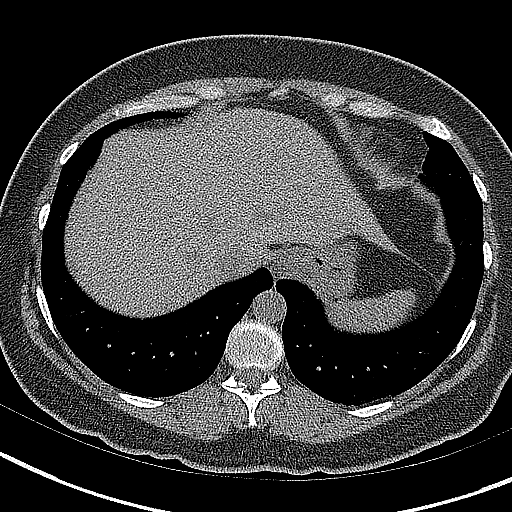
[im 28/30  soft-tissue]
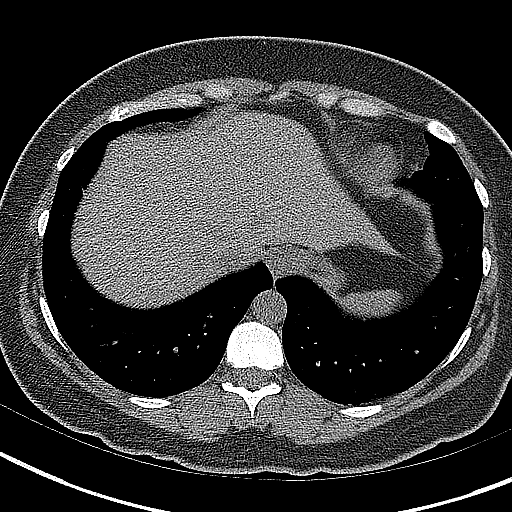

[Series 4: coronal · coronal · 0.63mm/px · 3 of 134 slices shown]
[im 45/134  soft-tissue]
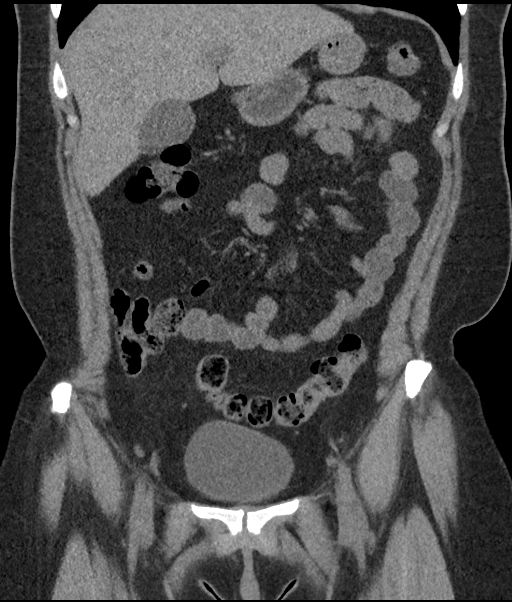
[im 60/134  soft-tissue]
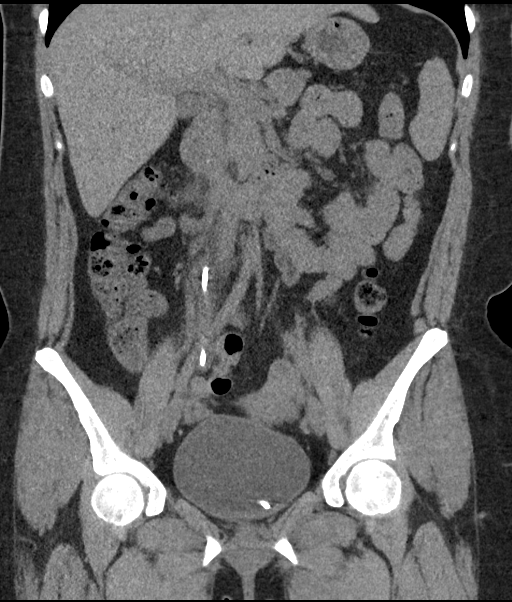
[im 74/134  soft-tissue]
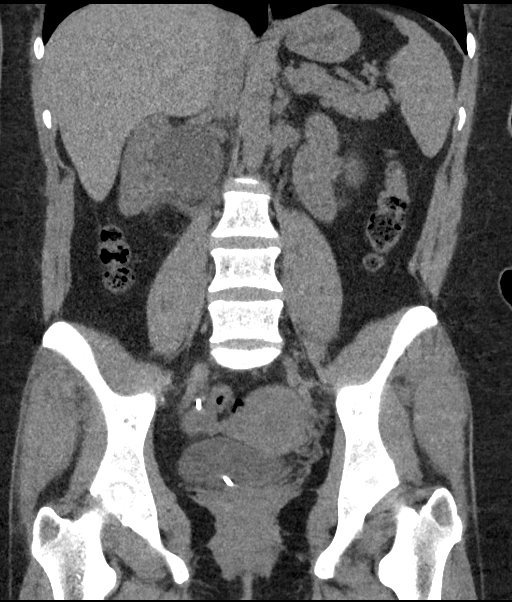

[16 of 44 positions shown; findings below may reference images not displayed]

FINDINGS: Lower chest: Lung bases demonstrate no acute consolidation or
effusion.

Hepatobiliary: No focal liver abnormality is seen. No gallstones,
gallbladder wall thickening, or biliary dilatation.

Pancreas: Unremarkable. No pancreatic ductal dilatation or
surrounding inflammatory changes.

Spleen: Normal in size without focal abnormality.

Adrenals/Urinary Tract: Adrenal glands are normal. Multiple left
intrarenal stones measuring up to 5 mm at the lower pole. Interval
placement of right ureteral stent. Proximal pigtail position at the
right UPJ, distal pigtail position at the left anterior inferior
bladder. 5 mm stone adjacent to the ureteral stent distally, about a
cm proximal to the right UVJ.

Stomach/Bowel: Stomach is within normal limits. Appendix appears
normal. No evidence of bowel wall thickening, distention, or
inflammatory changes.

Vascular/Lymphatic: No significant vascular findings are present. No
enlarged abdominal or pelvic lymph nodes.

Reproductive: Uterus and bilateral adnexa are unremarkable.

Other: Negative for free air or free fluid

Musculoskeletal: No acute or significant osseous findings.
IMPRESSION: 1. Persistent severe right hydronephrosis and hydroureter. Interval
placement of a right ureteral stent with proximal aspect of the
stent positioned at the UPJ/proximal right ureter. 5 mm stone
fragment in the distal right ureter adjacent to the stent about a cm
proximal to the UVJ. Correlate for stent function.
2. Nonobstructing intrarenal stone on the left

## 2022-08-05 ENCOUNTER — Ambulatory Visit (INDEPENDENT_AMBULATORY_CARE_PROVIDER_SITE_OTHER): Payer: BC Managed Care – PPO

## 2022-08-05 ENCOUNTER — Ambulatory Visit: Payer: BC Managed Care – PPO | Admitting: Podiatry

## 2022-08-05 DIAGNOSIS — M7661 Achilles tendinitis, right leg: Secondary | ICD-10-CM

## 2022-08-05 MED ORDER — MELOXICAM 15 MG PO TABS: 15.0000 mg | ORAL_TABLET | Freq: Every day | ORAL | 0 refills | Status: DC

## 2022-08-05 NOTE — Patient Instructions (Signed)

## 2022-08-05 NOTE — Progress Notes (Signed)
  Subjective:  Patient ID: Bianca [name], female    DOB: April 12, 1986,   MRN: [id number]  Chief Complaint  Patient presents with   Foot Pain    Right heel pain    37 y.o. female presents for concern of right heel pain that has been going on for almost a year. Relates painful when walking or with any pressure of the back of the heel. Relates some swelling. Denies any current treatments.  . Denies any other pedal complaints. Denies n/v/f/c.   Past Medical History:  Diagnosis Date   GERD (gastroesophageal reflux disease)    History of kidney stones    History of recurrent UTIs    Hypertension    Kidney stones     Objective:  Physical Exam: Vascular: DP/PT pulses 2/4 bilateral. CFT <3 seconds. Normal hair growth on digits. No edema.  Skin. No lacerations or abrasions bilateral feet.  Musculoskeletal: MMT 5/5 bilateral lower extremities in DF, PF, Inversion and Eversion. Deceased ROM in DF of ankle joint. Tender to insertion of achilles tendon and proximally along the tendon and distally along insertion site. No pain to medial calcaneal tubercle. Some pain along the PT tendon. Pain with DF and PF of the ankle.  Neurological: Sensation intact to light touch.   Assessment:   1. Tendonitis, Achilles, right      Plan:  Patient was evaluated and treated and all questions answered. -Xrays reviewed. No acute fractures or dislocations. 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 Os trigonum noted as well.  -Discussed Achilles insertional tendonitis and treatment options with patient.  -Discussed stretching exercises. -Rx Meloxicam provided. Most recent Cr and BUN in normal range.  -Heel lifts provided and discussed proper shoewear.  -Discussed if no improvement will consider MRI/PT/EPAT/PRP injections.  -Patient to return to office 6 weeks for recheck.    Louann Sjogren, DPM

## 2022-09-01 ENCOUNTER — Ambulatory Visit (INDEPENDENT_AMBULATORY_CARE_PROVIDER_SITE_OTHER): Payer: BC Managed Care – PPO | Admitting: Podiatry

## 2022-09-01 DIAGNOSIS — Z91199 Patient's noncompliance with other medical treatment and regimen due to unspecified reason: Secondary | ICD-10-CM

## 2022-09-01 NOTE — Progress Notes (Signed)
No show

## 2023-01-06 ENCOUNTER — Other Ambulatory Visit: Payer: Self-pay | Admitting: Obstetrics & Gynecology

## 2023-01-06 DIAGNOSIS — E049 Nontoxic goiter, unspecified: Secondary | ICD-10-CM

## 2023-01-09 ENCOUNTER — Ambulatory Visit
Admission: RE | Admit: 2023-01-09 | Discharge: 2023-01-09 | Disposition: A | Discharge status: Home / Self Care | Payer: BC Managed Care – PPO | Source: Ambulatory Visit | Attending: Obstetrics & Gynecology | Admitting: Obstetrics & Gynecology

## 2023-01-09 DIAGNOSIS — E049 Nontoxic goiter, unspecified: Secondary | ICD-10-CM

## 2023-02-07 ENCOUNTER — Ambulatory Visit: Payer: BC Managed Care – PPO | Admitting: Obstetrics & Gynecology

## 2023-06-11 ENCOUNTER — Other Ambulatory Visit: Payer: Self-pay

## 2023-06-11 ENCOUNTER — Encounter (HOSPITAL_COMMUNITY): Payer: Self-pay | Admitting: *Deleted

## 2023-06-11 ENCOUNTER — Emergency Department (HOSPITAL_COMMUNITY): Payer: BC Managed Care – PPO

## 2023-06-11 ENCOUNTER — Inpatient Hospital Stay (HOSPITAL_COMMUNITY)
Admission: EM | Admit: 2023-06-11 | Discharge: 2023-06-13 | DRG: 661 | Disposition: A | Discharge status: Home / Self Care | Payer: BC Managed Care – PPO | Attending: Student in an Organized Health Care Education/Training Program | Admitting: Student in an Organized Health Care Education/Training Program

## 2023-06-11 DIAGNOSIS — Z791 Long term (current) use of non-steroidal anti-inflammatories (NSAID): Secondary | ICD-10-CM | POA: Diagnosis not present

## 2023-06-11 DIAGNOSIS — E876 Hypokalemia: Secondary | ICD-10-CM | POA: Diagnosis present

## 2023-06-11 DIAGNOSIS — O10019 Pre-existing essential hypertension complicating pregnancy, unspecified trimester: Secondary | ICD-10-CM | POA: Diagnosis present

## 2023-06-11 DIAGNOSIS — I10 Essential (primary) hypertension: Secondary | ICD-10-CM | POA: Diagnosis present

## 2023-06-11 DIAGNOSIS — Z8249 Family history of ischemic heart disease and other diseases of the circulatory system: Secondary | ICD-10-CM

## 2023-06-11 DIAGNOSIS — F17210 Nicotine dependence, cigarettes, uncomplicated: Secondary | ICD-10-CM | POA: Diagnosis present

## 2023-06-11 DIAGNOSIS — Z885 Allergy status to narcotic agent status: Secondary | ICD-10-CM

## 2023-06-11 DIAGNOSIS — Z6831 Body mass index (BMI) 31.0-31.9, adult: Secondary | ICD-10-CM

## 2023-06-11 DIAGNOSIS — N201 Calculus of ureter: Principal | ICD-10-CM

## 2023-06-11 DIAGNOSIS — Z3A00 Weeks of gestation of pregnancy not specified: Secondary | ICD-10-CM

## 2023-06-11 DIAGNOSIS — N83201 Unspecified ovarian cyst, right side: Secondary | ICD-10-CM | POA: Diagnosis present

## 2023-06-11 DIAGNOSIS — N139 Obstructive and reflux uropathy, unspecified: Principal | ICD-10-CM | POA: Diagnosis present

## 2023-06-11 DIAGNOSIS — Z87442 Personal history of urinary calculi: Secondary | ICD-10-CM | POA: Diagnosis not present

## 2023-06-11 DIAGNOSIS — D72829 Elevated white blood cell count, unspecified: Secondary | ICD-10-CM | POA: Diagnosis present

## 2023-06-11 DIAGNOSIS — E66811 Obesity, class 1: Secondary | ICD-10-CM | POA: Diagnosis present

## 2023-06-11 DIAGNOSIS — Z87440 Personal history of urinary (tract) infections: Secondary | ICD-10-CM | POA: Diagnosis not present

## 2023-06-11 DIAGNOSIS — F32A Depression, unspecified: Secondary | ICD-10-CM

## 2023-06-11 DIAGNOSIS — Z79899 Other long term (current) drug therapy: Secondary | ICD-10-CM | POA: Diagnosis not present

## 2023-06-11 DIAGNOSIS — N132 Hydronephrosis with renal and ureteral calculous obstruction: Principal | ICD-10-CM | POA: Diagnosis present

## 2023-06-11 DIAGNOSIS — R109 Unspecified abdominal pain: Secondary | ICD-10-CM

## 2023-06-11 DIAGNOSIS — K219 Gastro-esophageal reflux disease without esophagitis: Secondary | ICD-10-CM | POA: Diagnosis present

## 2023-06-11 DIAGNOSIS — D75839 Thrombocytosis, unspecified: Secondary | ICD-10-CM | POA: Diagnosis present

## 2023-06-11 LAB — COMPREHENSIVE METABOLIC PANEL
ALT: 25 U/L (ref 0–44)
AST: 20 U/L (ref 15–41)
Albumin: 3.8 g/dL (ref 3.5–5.0)
Alkaline Phosphatase: 107 U/L (ref 38–126)
Anion gap: 10 (ref 5–15)
BUN: 9 mg/dL (ref 6–20)
CO2: 22 mmol/L (ref 22–32)
Calcium: 9.1 mg/dL (ref 8.9–10.3)
Chloride: 106 mmol/L (ref 98–111)
Creatinine, Ser: 0.72 mg/dL (ref 0.44–1.00)
GFR, Estimated: >60 mL/min (ref >60)
Glucose, Bld: 114 mg/dL — ABNORMAL HIGH (ref 70–99)
Potassium: 3.6 mmol/L (ref 3.5–5.1)
Sodium: 138 mmol/L (ref 135–145)
Total Bilirubin: 0.5 mg/dL (ref 0.0–1.2)
Total Protein: 7.3 g/dL (ref 6.5–8.1)

## 2023-06-11 LAB — URINALYSIS, ROUTINE W REFLEX MICROSCOPIC
Bilirubin Urine: NEGATIVE
Glucose, UA: NEGATIVE mg/dL
Ketones, ur: NEGATIVE mg/dL
Leukocytes,Ua: NEGATIVE
Nitrite: NEGATIVE
Protein, ur: NEGATIVE mg/dL
Specific Gravity, Urine: 1.013 (ref 1.005–1.030)
pH: 6 (ref 5.0–8.0)

## 2023-06-11 LAB — CBC
HCT: 39.2 % (ref 36.0–46.0)
Hemoglobin: 13.2 g/dL (ref 12.0–15.0)
MCH: 28.1 pg (ref 26.0–34.0)
MCHC: 33.7 g/dL (ref 30.0–36.0)
MCV: 83.4 fL (ref 80.0–100.0)
Platelets: 403 10*3/uL — ABNORMAL HIGH (ref 150–400)
RBC: 4.7 MIL/uL (ref 3.87–5.11)
RDW: 14.8 % (ref 11.5–15.5)
WBC: 12.3 10*3/uL — ABNORMAL HIGH (ref 4.0–10.5)
nRBC: 0 % (ref 0.0–0.2)

## 2023-06-11 LAB — PREGNANCY, URINE: Preg Test, Ur: NEGATIVE

## 2023-06-11 MED ORDER — SODIUM CHLORIDE 0.9 % IV BOLUS
1000.0000 mL | Freq: Once | INTRAVENOUS | Status: AC
  Administered 2023-06-11: 1000 mL via INTRAVENOUS

## 2023-06-11 MED ORDER — HYDROMORPHONE HCL 1 MG/ML IJ SOLN
0.5000 mg | Freq: Once | INTRAMUSCULAR | Status: AC
  Administered 2023-06-11: 0.5 mg via INTRAVENOUS
  Filled 2023-06-11 (×2): qty 0.5

## 2023-06-11 MED ORDER — KETOROLAC TROMETHAMINE 15 MG/ML IJ SOLN
15.0000 mg | Freq: Once | INTRAMUSCULAR | Status: AC
  Administered 2023-06-11: 15 mg via INTRAVENOUS
  Filled 2023-06-11: qty 1

## 2023-06-11 MED ORDER — HYDROMORPHONE HCL 1 MG/ML IJ SOLN
2.0000 mg | Freq: Once | INTRAMUSCULAR | Status: AC
  Administered 2023-06-11: 2 mg via INTRAVENOUS
  Filled 2023-06-11: qty 2

## 2023-06-11 MED ORDER — ONDANSETRON HCL 4 MG/2ML IJ SOLN
4.0000 mg | Freq: Once | INTRAMUSCULAR | Status: AC
  Administered 2023-06-11: 4 mg via INTRAVENOUS
  Filled 2023-06-11: qty 2

## 2023-06-11 MED ORDER — HYDROMORPHONE HCL 1 MG/ML IJ SOLN
1.0000 mg | Freq: Once | INTRAMUSCULAR | Status: AC
  Administered 2023-06-11: 1 mg via INTRAVENOUS
  Filled 2023-06-11: qty 1

## 2023-06-11 NOTE — H&P (Incomplete)
 [place]   PATIENT NAME: Bianca [name]    MR#:  [id number]  DATE OF BIRTH:  09/28/1985  DATE OF ADMISSION:  06/11/2023  PRIMARY CARE PHYSICIAN: [facility]   Patient is coming from: Home  REQUESTING/REFERRING PHYSICIAN: Loetta Rough, MD   CHIEF COMPLAINT:   Chief Complaint  Patient presents with   Flank Pain    HISTORY OF PRESENT ILLNESS:  Bianca [name] is a 38 y.o. Caucasian female with medical history significant for GERD, essential hypertension, urolithiasis and history of recurrent UTI, who presented to the emergency room with acute onset of severe right flank pain since Friday with associated intermittent nausea and vomiting.  She denied any fever or chills.  She admits to mild dysuria without hematuria, urinary frequency or urgency.  No chest pain or dyspnea or cough or wheezing.  ED Course: When the patient came to the ER, BP was 148/102 with a heart rate of 109.  Labs revealed unremarkable CMP.  CBC showed leukocytosis 12.3 and thrombocytosis of 403.  Urine pregnancy test was negative.  Urinalysis was remarkable for 6-10 WBCs with rare bacteria, 11-20 RBCs and negative nitrites and negative leukocytes. EKG as reviewed by me : None Imaging: CT renal stone showed the following: 4 mm distal right ureteral stone with moderate right hydronephrosis and hydroureter.   Left nephrolithiasis.   7 cm mass in the right adnexal region. This could reflect a pedunculated fibroid or right ovarian lesion, possibly hemorrhagic cyst. This could be further evaluated with pelvic ultrasound.  The patient was given several doses of IV Dilaudid, 15 mg of IV Toradol, 4 mg of IV Zofran twice and 1 L bolus of IV normal saline.  She will be admitted to a medical bed for further evaluation and management.   PAST MEDICAL HISTORY:   Past Medical History:  Diagnosis Date   GERD (gastroesophageal reflux disease)    History of kidney stones    History of recurrent  UTIs    Hypertension    Kidney stones     PAST SURGICAL HISTORY:   Past Surgical History:  Procedure Laterality Date   CYSTOSCOPY WITH STENT PLACEMENT Right 06/14/2019   Procedure: Cystoscopy, Right Retrograde Pyelogram, Right Stent Placement;  Surgeon: Malen Gauze, MD;  Location: [facility];  Service: Urology;  Laterality: Right;   CYSTOSCOPY/URETEROSCOPY/HOLMIUM LASER/STENT PLACEMENT Right 07/08/2019   Procedure: CYSTOSCOPY/RIGHT URETEROSCOPY/RIGHT RETROGRADE PYELOGRAM/STENT EXCHANGE;  Surgeon: Malen Gauze, MD;  Location: [facility];  Service: Urology;  Laterality: Right;   HOLMIUM LASER APPLICATION Right 07/08/2019   Procedure: HOLMIUM LASER LITHOTRIPSY RIGHT URETERAL CALCULUS;  Surgeon: Malen Gauze, MD;  Location: [facility];  Service: Urology;  Laterality: Right;   NO PAST SURGERIES      SOCIAL HISTORY:   Social History   Tobacco Use   Smoking status: Every Day    Current packs/day: 0.00    Average packs/day: 0.5 packs/day for 10.0 years (5.0 ttl pk-yrs)    Types: Cigarettes    Start date: 03/25/2004    Last attempt to quit: 03/25/2014    Years since quitting: 9.2   Smokeless tobacco: Never  Substance Use Topics   Alcohol use: No    FAMILY HISTORY:   Family History  Problem Relation Age of Onset   Heart disease Paternal Grandmother    Cancer Paternal Grandmother        breast   Thyroid disease Father    Cancer Father  lung   Hodgkin's lymphoma Father    Sudden Cardiac Death Father        Was not sure if was an MI or not.  Was during cancer treatment.  No autopsy done   Kidney disease Mother        GN3N   Other Maternal Grandmother        Guillian Barre syndrome    DRUG ALLERGIES:   Allergies  Allergen Reactions   Codeine Hives and Shortness Of Breath    Tolerates hydromorphone    REVIEW OF SYSTEMS:   ROS As per history of present illness. All pertinent systems were reviewed above. Constitutional, HEENT, cardiovascular, respiratory,  GI, GU, musculoskeletal, neuro, psychiatric, endocrine, integumentary and hematologic systems were reviewed and are otherwise negative/unremarkable except for positive findings mentioned above in the HPI.   MEDICATIONS AT HOME:   Prior to Admission medications   Medication Sig Start Date End Date Taking? Authorizing Provider  albuterol (VENTOLIN HFA) 108 (90 Base) MCG/ACT inhaler Inhale 2 puffs into the lungs every 4 (four) hours as needed for wheezing or shortness of breath. 09/01/20   [provider]  amLODipine (NORVASC) 5 MG tablet Take 1 tablet (5 mg total) by mouth daily. 07/31/18   Erick Blinks, MD  amoxicillin-clavulanate (AUGMENTIN) 875-125 MG tablet Take 1 tablet by mouth See admin instructions. Bid x 7 days Patient not taking: No sig reported 09/01/20   [provider]  benzonatate (TESSALON) 200 MG capsule Take 200 mg by mouth 3 (three) times daily as needed. 09/01/20   [provider]  cephALEXin (KEFLEX) 500 MG capsule Take 1,000 mg by mouth See admin instructions. Bid x 7 days 05/06/20   [provider]  fluconazole (DIFLUCAN) 150 MG tablet Take 150 mg by mouth See admin instructions. Qd x 2 days 05/06/20   [provider]  Ibuprofen (MOTRIN PO) Take 1 tablet by mouth every 8 (eight) hours as needed (pain).    [provider]  loratadine (CLARITIN) 10 MG tablet Take 10 mg by mouth daily.    [provider]  meloxicam (MOBIC) 15 MG tablet Take 1 tablet (15 mg total) by mouth daily. 08/05/22   Louann Sjogren, DPM  nicotine (NICODERM CQ - DOSED IN MG/24 HOURS) 14 mg/24hr patch Place 14 mg onto the skin daily.    [provider]  ondansetron (ZOFRAN-ODT) 4 MG disintegrating tablet Take 4 mg by mouth every 4 (four) hours as needed for nausea or vomiting. 09/18/20   [provider]  oxyCODONE-acetaminophen (PERCOCET) 7.5-325 MG tablet Take 1 tablet by mouth every 6 (six) hours as needed for moderate pain or severe  pain. 09/18/20   [provider]  promethazine (PHENERGAN) 25 MG tablet Take 1 tablet (25 mg total) by mouth every 6 (six) hours as needed for nausea or vomiting. 09/22/20   Pricilla Loveless, MD  sulfamethoxazole-trimethoprim (BACTRIM DS) 800-160 MG tablet Take 1 tablet by mouth 2 (two) times daily. Patient not taking: No sig reported 07/17/19   Malen Gauze, MD  tamsulosin (FLOMAX) 0.4 MG CAPS capsule Take 0.4 mg by mouth daily as needed (renalcolic).    [provider]      VITAL SIGNS:  Blood pressure (!) 134/93, pulse (!) 116, temperature 98.1 F (36.7 C), temperature source Oral, resp. rate 18, height 5\' 2"  (1.575 m), weight 77.1 kg, last menstrual period 06/02/2023, SpO2 95%.  PHYSICAL EXAMINATION:  Physical Exam  GENERAL:  38 y.o.-year-old patient lying in the bed with  no acute distress.  EYES: Pupils equal, round, reactive to light and accommodation. No scleral icterus. Extraocular muscles intact.  HEENT: Head atraumatic, normocephalic. Oropharynx and nasopharynx clear.  NECK:  Supple, no jugular venous distention. No thyroid enlargement, no tenderness.  LUNGS: Normal breath sounds bilaterally, no wheezing, rales,rhonchi or crepitation. No use of accessory muscles of respiration.  CARDIOVASCULAR: Regular rate and rhythm, S1, S2 normal. No murmurs, rubs, or gallops.  ABDOMEN: Soft, nondistended, nontender. Bowel sounds present. No organomegaly or mass.  Right CVA tenderness. EXTREMITIES: No pedal edema, cyanosis, or clubbing.  NEUROLOGIC: Cranial nerves II through XII are intact. Muscle strength 5/5 in all extremities. Sensation intact. Gait not checked.  PSYCHIATRIC: The patient is alert and oriented x 3.  Normal affect and good eye contact. SKIN: No obvious rash, lesion, or ulcer.   LABORATORY PANEL:   CBC Recent Labs  Lab 06/11/23 1746  WBC 12.3*  HGB 13.2  HCT 39.2  PLT 403*    ------------------------------------------------------------------------------------------------------------------  Chemistries  Recent Labs  Lab 06/11/23 1746  NA 138  K 3.6  CL 106  CO2 22  GLUCOSE 114*  BUN 9  CREATININE 0.72  CALCIUM 9.1  AST 20  ALT 25  ALKPHOS 107  BILITOT 0.5   ------------------------------------------------------------------------------------------------------------------  Cardiac Enzymes No results for input(s): "TROPONINI" in the last 168 hours. ------------------------------------------------------------------------------------------------------------------  RADIOLOGY:  US PELVIC COMPLETE W TRANSVAGINAL AND TORSION R/O Result Date: 06/11/2023 CLINICAL DATA:  Right lower quadrant pain EXAM: TRANSABDOMINAL AND TRANSVAGINAL ULTRASOUND OF PELVIS DOPPLER ULTRASOUND OF OVARIES TECHNIQUE: Both transabdominal and transvaginal ultrasound examinations of the pelvis were performed. Transabdominal technique was performed for global imaging of the pelvis including uterus, ovaries, adnexal regions, and pelvic cul-de-sac. It was necessary to proceed with endovaginal exam following the transabdominal exam to visualize the uterus, endometrium, ovaries and adnexa. Color and duplex Doppler ultrasound was utilized to evaluate blood flow to the ovaries. COMPARISON:  CT today. FINDINGS: Uterus Measurements: 9.0 x 4.6 x 5.4 cm = volume: 118 mL. No fibroids or other mass visualized. Endometrium Thickness: 19 mm in thickness.  No focal abnormality visualized. Right ovary Measurements: Large hypoechoic area in the right adnexa measuring 7.0 x 6.9 x 5.0 cm. This has low level internal echoes and is likely within the right ovary. This could reflect a hemorrhagic cyst or endometrioma. Left ovary Measurements: Cystic structure in the left adnexa measures 3.3 x 3.3 x 3.8 cm, likely within the left ovary. This appears to be a simple cyst. Pulsed Doppler evaluation of both ovaries  demonstrates normal low-resistance arterial and venous waveforms. Other findings No abnormal free fluid. IMPRESSION: Complex hypoechoic area in the right ovary measuring up to 7 cm, most likely hemorrhagic cyst or endometrioma. 3.8 cm simple appearing cyst in the left ovary. These findings could be followed with repeat ultrasound in 3 months to assess stability. Electronically Signed   By: Charlett Nose M.D.   On: 06/11/2023 23:13   CT Renal Stone Study Result Date: 06/11/2023 CLINICAL DATA:  Right flank pain, left lower quadrant pain. Nausea, vomiting. EXAM: CT ABDOMEN AND PELVIS WITHOUT CONTRAST TECHNIQUE: Multidetector CT imaging of the abdomen and pelvis was performed following the standard protocol without IV contrast. RADIATION DOSE REDUCTION: This exam was performed according to the departmental dose-optimization program which includes automated exposure control, adjustment of the mA and/or kV according to patient size and/or use of iterative reconstruction technique. COMPARISON:  07/03/2019 FINDINGS: Lower chest: No acute abnormality Hepatobiliary: No focal hepatic abnormality. Gallbladder unremarkable. Pancreas:  No focal abnormality or ductal dilatation. Spleen: No focal abnormality.  Normal size. Adrenals/Urinary Tract: Adrenal glands normal. Moderate right hydronephrosis and hydroureter due to 4 mm distal right ureteral stone. Left lower pole nonobstructing nephrolithiasis. No hydronephrosis on the left. Urinary bladder normal. Stomach/Bowel: Normal appendix. Stomach, large and small bowel grossly unremarkable. Vascular/Lymphatic: No evidence of aneurysm or adenopathy. Reproductive: 7 cm mass in the right adnexa. It is difficult to determine if this is an exophytic/pedunculated fibroid or a right ovarian hemorrhagic cyst. 3.2 cm cyst in the left ovary. Other: No free fluid or free air. Musculoskeletal: No acute bony abnormality. IMPRESSION: 4 mm distal right ureteral stone with moderate right  hydronephrosis and hydroureter. Left nephrolithiasis. 7 cm mass in the right adnexal region. This could reflect a pedunculated fibroid or right ovarian lesion, possibly hemorrhagic cyst. This could be further evaluated with pelvic ultrasound. Electronically Signed   By: Charlett Nose M.D.   On: 06/11/2023 20:33      IMPRESSION AND PLAN:  Assessment and Plan: * Acute unilateral obstructive uropathy - The patient was admitted to a medical-surgical bed. - This is secondary to right ureteral stone. - Pain management will be provided. - Urology consult to be obtained. - I sent a timed message for Dr. Berneice Heinrich in AM.  Benign essential hypertension, antepartum - We will continue antihypertensive therapy.    Depression - We will continue Lexapro and Wellbutrin XL.   DVT prophylaxis: Lovenox.  Advanced Care Planning:  Code Status: full code.  Family Communication:  The plan of care was discussed in details with the patient (and family). I answered all questions. The patient agreed to proceed with the above mentioned plan. Further management will depend upon hospital course. Disposition Plan: Back to previous home environment Consults called: none.  All the records are reviewed and case discussed with ED provider.  Status is: Inpatient  At the time of the admission, it appears that the appropriate admission status for this patient is inpatient.  This is judged to be reasonable and necessary in order to provide the required intensity of service to ensure the patient's safety given the presenting symptoms, physical exam findings and initial radiographic and laboratory data in the context of comorbid conditions.  The patient requires inpatient status due to high intensity of service, high risk of further deterioration and high frequency of surveillance required.  I certify that at the time of admission, it is my clinical judgment that the patient will require inpatient hospital care extending more  than 2 midnights.                            Dispo: The patient is from: Home              Anticipated d/c is to: Home              Patient currently is not medically stable to d/c.              Difficult to place patient: No  Hannah Beat M.D on 06/12/2023 at 3:30 AM  [facility]   From 7 PM-7 AM, contact night-coverage [email or web address]  CC: Primary care physician; [facility]

## 2023-06-11 NOTE — ED Triage Notes (Signed)
 Pt with right flank pain and LLQ pain.  + N/V x 4 today. Denies any fever or chills. Pt with hx of kidney stones. + burning with urination.

## 2023-06-11 NOTE — ED Provider Notes (Signed)
 [place] EM[facility]ENC[facility] DEPARTMENT AT [facility] Provider Note   CSN: [id number] Arrival date & time: 06/11/23  1722     History  Chief Complaint  Patient presents with   Flank Pain    Bianca [name] is a 38 y.o. female.  She has PMH of kidney stones and ovarian cyst.  Presents to the ER complaining of right flank pain that feels like prior kidney stones that started today.  She is having nausea and vomiting.  Admits to dysuria this morning as well, denies fevers or chills.   Flank Pain       Home Medications Prior to Admission medications   Medication Sig Start Date End Date Taking? Authorizing Provider  albuterol (VENTOLIN HFA) 108 (90 Base) MCG/ACT inhaler Inhale 2 puffs into the lungs every 4 (four) hours as needed for wheezing or shortness of breath. 09/01/20   [provider]  amLODipine (NORVASC) 5 MG tablet Take 1 tablet (5 mg total) by mouth daily. 07/31/18   Erick Blinks, MD  amoxicillin-clavulanate (AUGMENTIN) 875-125 MG tablet Take 1 tablet by mouth See admin instructions. Bid x 7 days Patient not taking: No sig reported 09/01/20   [provider]  benzonatate (TESSALON) 200 MG capsule Take 200 mg by mouth 3 (three) times daily as needed. 09/01/20   [provider]  cephALEXin (KEFLEX) 500 MG capsule Take 1,000 mg by mouth See admin instructions. Bid x 7 days 05/06/20   [provider]  fluconazole (DIFLUCAN) 150 MG tablet Take 150 mg by mouth See admin instructions. Qd x 2 days 05/06/20   [provider]  Ibuprofen (MOTRIN PO) Take 1 tablet by mouth every 8 (eight) hours as needed (pain).    [provider]  loratadine (CLARITIN) 10 MG tablet Take 10 mg by mouth daily.    [provider]  meloxicam (MOBIC) 15 MG tablet Take 1 tablet (15 mg total) by mouth daily. 08/05/22   Louann Sjogren, DPM  nicotine (NICODERM CQ - DOSED IN MG/24 HOURS) 14 mg/24hr patch Place 14 mg onto the skin daily.    [provider]  ondansetron (ZOFRAN-ODT) 4 MG disintegrating tablet Take 4 mg by mouth every 4 (four) hours as needed for nausea or vomiting. 09/18/20   [provider]  oxyCODONE-acetaminophen (PERCOCET) 7.5-325 MG tablet Take 1 tablet by mouth every 6 (six) hours as needed for moderate pain or severe pain. 09/18/20   [provider]  promethazine (PHENERGAN) 25 MG tablet Take 1 tablet (25 mg total) by mouth every 6 (six) hours as needed for nausea or vomiting. 09/22/20   Pricilla Loveless, MD  sulfamethoxazole-trimethoprim (BACTRIM DS) 800-160 MG tablet Take 1 tablet by mouth 2 (two) times daily. Patient not taking: No sig reported 07/17/19   Malen Gauze, MD  tamsulosin (FLOMAX) 0.4 MG CAPS capsule Take 0.4 mg by mouth daily as needed (renalcolic).    [provider]      Allergies    Codeine    Review of Systems   Review of Systems  Genitourinary:  Positive for flank pain.    Physical Exam Updated Vital Signs BP (!) 148/102 (BP Location: Right Arm)   Pulse (!) 109   Temp 98.1 F (36.7 C) (Oral)   Resp 18   Ht 5\' 2"  (1.575 m)   Wt 77.1 kg   LMP 06/02/2023   SpO2 95%   BMI 31.09 kg/m  Physical Exam Vitals and nursing note reviewed.  Constitutional:  General: She is not in acute distress.    Appearance: She is well-developed.  HENT:     Head: Normocephalic and atraumatic.     Mouth/Throat:     Mouth: Mucous membranes are moist.  Eyes:     Conjunctiva/sclera: Conjunctivae normal.  Cardiovascular:     Rate and Rhythm: Normal rate and regular rhythm.     Heart sounds: No murmur heard. Pulmonary:     Effort: Pulmonary effort is normal. No respiratory distress.     Breath sounds: Normal breath sounds.  Abdominal:     Palpations: Abdomen is soft.     Tenderness: There is no abdominal tenderness.  Musculoskeletal:        General: No swelling.     Cervical back: Neck supple.  Skin:    General: Skin is warm and dry.     Capillary Refill:  Capillary refill takes less than 2 seconds.  Neurological:     General: No focal deficit present.     Mental Status: She is alert and oriented to person, place, and time.  Psychiatric:        Mood and Affect: Mood normal.     ED Results / Procedures / Treatments   Labs (all labs ordered are listed, but only abnormal results are displayed) Labs Reviewed  CBC - Abnormal; Notable for the following components:      Result Value   WBC 12.3 (*)    Platelets 403 (*)    All other components within normal limits  COMPREHENSIVE METABOLIC PANEL - Abnormal; Notable for the following components:   Glucose, Bld 114 (*)    All other components within normal limits  URINALYSIS, ROUTINE W REFLEX MICROSCOPIC  PREGNANCY, URINE    EKG None  Radiology No results found.  Procedures Procedures    Medications Ordered in ED Medications  ketorolac (TORADOL) 15 MG/ML injection 15 mg (15 mg Intravenous Given 06/11/23 1748)  ondansetron (ZOFRAN) injection 4 mg (4 mg Intravenous Given 06/11/23 1748)  HYDROmorphone (DILAUDID) injection 1 mg (1 mg Intravenous Given 06/11/23 1815)    ED Course/ Medical Decision Making/ A&P Clinical Course as of 06/11/23 1950  Sun Jun 11, 2023  1949 Hx kidney stones, ovarian cyst. Writhing at home, R flank pain. Pending ct renal stone study. Follow up on ct scan. [CG]    Clinical Course User Index [CG] Al Decant, PA-C                                 Medical Decision Making Ddx: Ureterolithiasis, UTI, ovarian cyst, ovarian torsion, other  Course: Patient here with right flank pain, feels like prior kidney stones, she is very uncomfortable in appearance, initially got improvement with Toradol and Dilaudid but pain is starting to return, given additional dose of Dilaudid, pending CT scan and UA.  Signed out to [employer].     Amount and/or Complexity of Data Reviewed Labs: ordered. Radiology: ordered.  Risk Prescription drug  management.           Final Clinical Impression(s) / ED Diagnoses Final diagnoses:  None    Rx / DC Orders ED Discharge Orders     None         Ma Rings, PA-C 06/11/23 2024    Loetta Rough, MD 06/12/23 [phone]

## 2023-06-11 NOTE — ED Notes (Signed)
 Patient transported to CT

## 2023-06-11 NOTE — ED Provider Notes (Signed)
  Physical Exam  BP (!) 134/93   Pulse 97   Temp 98.1 F (36.7 C) (Oral)   Resp 18   Ht 5\' 2"  (1.575 m)   Wt 77.1 kg   LMP 06/02/2023   SpO2 96%   BMI 31.09 kg/m   Physical Exam Vitals and nursing note reviewed.  Constitutional:      General: She is in acute distress.     Appearance: She is not toxic-appearing.  HENT:     Head: Normocephalic and atraumatic.  Pulmonary:     Effort: No respiratory distress.  Abdominal:     Tenderness: There is right CVA tenderness.  Skin:    Coloration: Skin is not jaundiced or pale.  Neurological:     Mental Status: She is alert and oriented to person, place, and time.  Psychiatric:        Behavior: Behavior normal.     Procedures  Procedures  ED Course / MDM   Clinical Course as of 06/11/23 2344  Sun Jun 11, 2023  1949 Hx kidney stones, ovarian cyst. Writhing at home, R flank pain. Pending ct renal stone study. Follow up on ct scan. [CG]    Clinical Course User Index [CG] Al Decant, PA-C   Medical Decision Making Amount and/or Complexity of Data Reviewed Labs: ordered. Radiology: ordered.  Risk Prescription drug management. Decision regarding hospitalization.   38 year old female signed out to me at shift change pending ultrasound imaging, pain reassessment.  Please see HPI for further details.  In short, 38 year old female presents with right-sided flank pain.  History of kidney stones as well as ovarian cysts.  Patient CBC has a slight leukocytosis of 12.3 with the patient is afebrile.  Her metabolic panel shows a baseline creatinine 0.72.  CT renal stone study shows a 4 mm distal right ureteral stone with moderate right hydronephrosis and hydroureter.  CT also revealed 7 cm mass in the right Anexsia region.  This could reflect a pedunculated fibroid or right ovarian lesion, possibly hemorrhagic cyst.  Will assess further with ultrasound to rule out torsion.  The patient is in a significant out of pain so  provide additional pain medication with 1 mg Dilaudid.  Ultrasound imaging reveals a complex hypoechoic area in the right ovary measuring up to 7 cm which is likely a hemorrhagic cyst or endometrioma.  Patient reassessed after ultrasound imaging results were collected.  The patient continues to complain of significant pain.  She is doubled over in the bed crying.  Will provide fluid as well as 2 mg Dilaudid.  Will admit the patient to the hospital for pain control.  Spoke with Dr. Arville Care who has agreed to admit patient for pain control.    Al Decant, PA-C 06/11/23 2344    Loetta Rough, MD 06/12/23 [phone]

## 2023-06-12 ENCOUNTER — Inpatient Hospital Stay (HOSPITAL_COMMUNITY): Payer: BC Managed Care – PPO | Admitting: Anesthesiology

## 2023-06-12 ENCOUNTER — Encounter (HOSPITAL_COMMUNITY)
Admission: EM | Disposition: A | Discharge status: Home / Self Care | Payer: Self-pay | Source: Home / Self Care | Attending: Internal Medicine

## 2023-06-12 ENCOUNTER — Encounter (HOSPITAL_COMMUNITY): Payer: Self-pay | Admitting: Family Medicine

## 2023-06-12 ENCOUNTER — Inpatient Hospital Stay (HOSPITAL_COMMUNITY): Payer: BC Managed Care – PPO

## 2023-06-12 DIAGNOSIS — F32A Depression, unspecified: Secondary | ICD-10-CM

## 2023-06-12 HISTORY — PX: CYSTOSCOPY/RETROGRADE/URETEROSCOPY/STONE EXTRACTION WITH BASKET: SHX5317

## 2023-06-12 HISTORY — PX: HOLMIUM LASER APPLICATION: SHX5852

## 2023-06-12 LAB — BASIC METABOLIC PANEL
Anion gap: 8 (ref 5–15)
BUN: 11 mg/dL (ref 6–20)
CO2: 21 mmol/L — ABNORMAL LOW (ref 22–32)
Calcium: 7.5 mg/dL — ABNORMAL LOW (ref 8.9–10.3)
Chloride: 108 mmol/L (ref 98–111)
Creatinine, Ser: 0.64 mg/dL (ref 0.44–1.00)
GFR, Estimated: >60 mL/min (ref >60)
Glucose, Bld: 119 mg/dL — ABNORMAL HIGH (ref 70–99)
Potassium: 3.4 mmol/L — ABNORMAL LOW (ref 3.5–5.1)
Sodium: 137 mmol/L (ref 135–145)

## 2023-06-12 LAB — CBC
HCT: 36.8 % (ref 36.0–46.0)
Hemoglobin: 12.1 g/dL (ref 12.0–15.0)
MCH: 28.3 pg (ref 26.0–34.0)
MCHC: 32.9 g/dL (ref 30.0–36.0)
MCV: 86 fL (ref 80.0–100.0)
Platelets: 331 10*3/uL (ref 150–400)
RBC: 4.28 MIL/uL (ref 3.87–5.11)
RDW: 14.9 % (ref 11.5–15.5)
WBC: 13.9 10*3/uL — ABNORMAL HIGH (ref 4.0–10.5)
nRBC: 0 % (ref 0.0–0.2)

## 2023-06-12 LAB — HIV ANTIBODY (ROUTINE TESTING W REFLEX): HIV Screen 4th Generation wRfx: NONREACTIVE

## 2023-06-12 SURGERY — CYSTOSCOPY, WITH CALCULUS REMOVAL USING BASKET
Anesthesia: General | Site: Ureter | Laterality: Right

## 2023-06-12 MED ORDER — FENTANYL CITRATE (PF) 100 MCG/2ML IJ SOLN
INTRAMUSCULAR | Status: DC | PRN
  Administered 2023-06-12: 50 ug via INTRAVENOUS
  Administered 2023-06-12 (×2): 25 ug via INTRAVENOUS

## 2023-06-12 MED ORDER — OXYCODONE-ACETAMINOPHEN 7.5-325 MG PO TABS
1.0000 | ORAL_TABLET | Freq: Four times a day (QID) | ORAL | Status: DC | PRN
  Administered 2023-06-13: 1 via ORAL
  Filled 2023-06-12: qty 1

## 2023-06-12 MED ORDER — ORAL CARE MOUTH RINSE: 15.0000 mL | Freq: Once | OROMUCOSAL | Status: DC

## 2023-06-12 MED ORDER — MIDAZOLAM HCL 2 MG/2ML IJ SOLN
INTRAMUSCULAR | Status: DC | PRN
  Administered 2023-06-12: 2 mg via INTRAVENOUS

## 2023-06-12 MED ORDER — BENZONATATE 100 MG PO CAPS: 200.0000 mg | ORAL_CAPSULE | Freq: Three times a day (TID) | ORAL | Status: DC | PRN

## 2023-06-12 MED ORDER — DIATRIZOATE MEGLUMINE 30 % UR SOLN
URETHRAL | Status: DC | PRN
  Administered 2023-06-12: 2 mL via URETHRAL

## 2023-06-12 MED ORDER — CHLORHEXIDINE GLUCONATE 0.12 % MT SOLN: 15.0000 mL | Freq: Once | OROMUCOSAL | Status: DC

## 2023-06-12 MED ORDER — FENTANYL CITRATE (PF) 100 MCG/2ML IJ SOLN
INTRAMUSCULAR | Status: AC
  Filled 2023-06-12: qty 2

## 2023-06-12 MED ORDER — HYDROMORPHONE HCL 1 MG/ML IJ SOLN
1.0000 mg | Freq: Once | INTRAMUSCULAR | Status: AC
  Administered 2023-06-12: 1 mg via INTRAVENOUS
  Filled 2023-06-12: qty 1

## 2023-06-12 MED ORDER — PROPOFOL 10 MG/ML IV BOLUS
INTRAVENOUS | Status: DC | PRN
Start: 2023-06-12 — End: 2023-06-12
  Administered 2023-06-12: 200 mg via INTRAVENOUS

## 2023-06-12 MED ORDER — STERILE WATER FOR IRRIGATION IR SOLN
Status: DC | PRN
  Administered 2023-06-12: 500 mL

## 2023-06-12 MED ORDER — OXYCODONE HCL 5 MG PO TABS: 5.0000 mg | ORAL_TABLET | Freq: Once | ORAL | Status: DC | PRN

## 2023-06-12 MED ORDER — SODIUM CHLORIDE 0.9 % IR SOLN
Status: DC | PRN
  Administered 2023-06-12: 3000 mL

## 2023-06-12 MED ORDER — KETOROLAC TROMETHAMINE 30 MG/ML IJ SOLN
30.0000 mg | Freq: Four times a day (QID) | INTRAMUSCULAR | Status: DC | PRN
  Administered 2023-06-12 – 2023-06-13 (×2): 30 mg via INTRAVENOUS
  Filled 2023-06-12 (×2): qty 1

## 2023-06-12 MED ORDER — AMLODIPINE BESYLATE 5 MG PO TABS
10.0000 mg | ORAL_TABLET | Freq: Every day | ORAL | Status: DC
  Administered 2023-06-12 – 2023-06-13 (×2): 10 mg via ORAL
  Filled 2023-06-12 (×2): qty 2

## 2023-06-12 MED ORDER — ACETAMINOPHEN 325 MG PO TABS: 650.0000 mg | ORAL_TABLET | Freq: Four times a day (QID) | ORAL | Status: DC | PRN

## 2023-06-12 MED ORDER — MIDAZOLAM HCL 2 MG/2ML IJ SOLN
INTRAMUSCULAR | Status: AC
Start: 2023-06-12 — End: ?
  Filled 2023-06-12: qty 2

## 2023-06-12 MED ORDER — ONDANSETRON HCL 4 MG/2ML IJ SOLN
4.0000 mg | Freq: Four times a day (QID) | INTRAMUSCULAR | Status: DC | PRN
  Administered 2023-06-12 (×2): 4 mg via INTRAVENOUS
  Filled 2023-06-12 (×2): qty 2

## 2023-06-12 MED ORDER — CHLORHEXIDINE GLUCONATE 0.12 % MT SOLN
OROMUCOSAL | Status: AC
  Administered 2023-06-12: 15 mL
  Filled 2023-06-12: qty 15

## 2023-06-12 MED ORDER — ALBUTEROL SULFATE (2.5 MG/3ML) 0.083% IN NEBU: 2.5000 mg | INHALATION_SOLUTION | RESPIRATORY_TRACT | Status: DC | PRN

## 2023-06-12 MED ORDER — SODIUM CHLORIDE 0.9 % IV SOLN
2.0000 g | Freq: Once | INTRAVENOUS | Status: AC
  Administered 2023-06-12: 2 g via INTRAVENOUS
  Filled 2023-06-12: qty 20

## 2023-06-12 MED ORDER — LACTATED RINGERS IV SOLN: INTRAVENOUS | Status: DC

## 2023-06-12 MED ORDER — ONDANSETRON HCL 4 MG PO TABS: 4.0000 mg | ORAL_TABLET | Freq: Four times a day (QID) | ORAL | Status: DC | PRN

## 2023-06-12 MED ORDER — SODIUM CHLORIDE 0.9 % IV BOLUS
1000.0000 mL | Freq: Once | INTRAVENOUS | Status: AC
  Administered 2023-06-12: 1000 mL via INTRAVENOUS

## 2023-06-12 MED ORDER — POTASSIUM CHLORIDE 10 MEQ/100ML IV SOLN
10.0000 meq | INTRAVENOUS | Status: AC
  Administered 2023-06-12 (×4): 10 meq via INTRAVENOUS
  Filled 2023-06-12 (×4): qty 100

## 2023-06-12 MED ORDER — ONDANSETRON HCL 4 MG/2ML IJ SOLN
INTRAMUSCULAR | Status: AC
Start: 2023-06-12 — End: ?
  Filled 2023-06-12: qty 2

## 2023-06-12 MED ORDER — TAMSULOSIN HCL 0.4 MG PO CAPS: 0.4000 mg | ORAL_CAPSULE | Freq: Every day | ORAL | Status: DC | PRN

## 2023-06-12 MED ORDER — SODIUM CHLORIDE 0.9 % IV BOLUS
500.0000 mL | Freq: Once | INTRAVENOUS | Status: AC
  Administered 2023-06-12: 500 mL via INTRAVENOUS

## 2023-06-12 MED ORDER — ONDANSETRON HCL 4 MG/2ML IJ SOLN
INTRAMUSCULAR | Status: DC | PRN
  Administered 2023-06-12: 4 mg via INTRAVENOUS

## 2023-06-12 MED ORDER — LIDOCAINE HCL (PF) 2 % IJ SOLN
INTRAMUSCULAR | Status: AC
  Filled 2023-06-12: qty 5

## 2023-06-12 MED ORDER — PROPOFOL 10 MG/ML IV BOLUS
INTRAVENOUS | Status: AC
  Filled 2023-06-12: qty 20

## 2023-06-12 MED ORDER — FENTANYL CITRATE PF 50 MCG/ML IJ SOSY: 25.0000 ug | PREFILLED_SYRINGE | INTRAMUSCULAR | Status: DC | PRN

## 2023-06-12 MED ORDER — ALBUTEROL SULFATE HFA 108 (90 BASE) MCG/ACT IN AERS: 2.0000 | INHALATION_SPRAY | RESPIRATORY_TRACT | Status: DC | PRN

## 2023-06-12 MED ORDER — OXYCODONE-ACETAMINOPHEN 7.5-325 MG PO TABS: 1.0000 | ORAL_TABLET | Freq: Four times a day (QID) | ORAL | Status: DC | PRN

## 2023-06-12 MED ORDER — DEXMEDETOMIDINE HCL IN NACL 80 MCG/20ML IV SOLN
INTRAVENOUS | Status: DC | PRN
  Administered 2023-06-12: 4 ug via INTRAVENOUS
  Administered 2023-06-12: 8 ug via INTRAVENOUS

## 2023-06-12 MED ORDER — MAGNESIUM HYDROXIDE 400 MG/5ML PO SUSP
30.0000 mL | Freq: Every day | ORAL | Status: DC | PRN
Start: 2023-06-12 — End: 2023-06-13

## 2023-06-12 MED ORDER — DEXAMETHASONE SODIUM PHOSPHATE 10 MG/ML IJ SOLN
INTRAMUSCULAR | Status: DC | PRN
  Administered 2023-06-12: 8 mg via INTRAVENOUS

## 2023-06-12 MED ORDER — LIDOCAINE HCL (CARDIAC) PF 100 MG/5ML IV SOSY
PREFILLED_SYRINGE | INTRAVENOUS | Status: DC | PRN
  Administered 2023-06-12: 100 mg via INTRATRACHEAL

## 2023-06-12 MED ORDER — HYDROMORPHONE HCL 1 MG/ML IJ SOLN
1.0000 mg | INTRAMUSCULAR | Status: DC | PRN
  Administered 2023-06-12 – 2023-06-13 (×6): 1 mg via INTRAVENOUS
  Filled 2023-06-12 (×6): qty 1

## 2023-06-12 MED ORDER — LORATADINE 10 MG PO TABS: 10.0000 mg | ORAL_TABLET | Freq: Every day | ORAL | Status: DC

## 2023-06-12 MED ORDER — TRAZODONE HCL 50 MG PO TABS: 25.0000 mg | ORAL_TABLET | Freq: Every evening | ORAL | Status: DC | PRN

## 2023-06-12 MED ORDER — OXYCODONE HCL 5 MG/5ML PO SOLN: 5.0000 mg | Freq: Once | ORAL | Status: DC | PRN

## 2023-06-12 MED ORDER — DIATRIZOATE MEGLUMINE 30 % UR SOLN
URETHRAL | Status: AC
  Filled 2023-06-12: qty 100

## 2023-06-12 MED ORDER — DROPERIDOL 2.5 MG/ML IJ SOLN
1.2500 mg | Freq: Once | INTRAMUSCULAR | Status: AC
  Administered 2023-06-12: 1.25 mg via INTRAVENOUS
  Filled 2023-06-12: qty 2

## 2023-06-12 MED ORDER — ONDANSETRON HCL 4 MG/2ML IJ SOLN: 4.0000 mg | Freq: Once | INTRAMUSCULAR | Status: DC | PRN

## 2023-06-12 MED ORDER — ENOXAPARIN SODIUM 40 MG/0.4ML IJ SOSY
40.0000 mg | PREFILLED_SYRINGE | INTRAMUSCULAR | Status: DC
  Administered 2023-06-12: 40 mg via SUBCUTANEOUS
  Filled 2023-06-12 (×2): qty 0.4

## 2023-06-12 MED ORDER — SODIUM CHLORIDE 0.9 % IV SOLN: INTRAVENOUS | Status: DC

## 2023-06-12 MED ORDER — DEXAMETHASONE SODIUM PHOSPHATE 10 MG/ML IJ SOLN
INTRAMUSCULAR | Status: AC
  Filled 2023-06-12: qty 1

## 2023-06-12 MED ORDER — PROPOFOL 10 MG/ML IV BOLUS
INTRAVENOUS | Status: AC
Start: 2023-06-12 — End: ?
  Filled 2023-06-12: qty 20

## 2023-06-12 MED ORDER — ACETAMINOPHEN 650 MG RE SUPP
650.0000 mg | Freq: Four times a day (QID) | RECTAL | Status: DC | PRN
Start: 2023-06-12 — End: 2023-06-13

## 2023-06-12 SURGICAL SUPPLY — 18 items
BAG DRAIN URO TABLE W/ADPT NS (BAG) ×1 IMPLANT
BASKET LASER NITINOL 1.9FR (BASKET) IMPLANT
CATH INTERMIT  6FR 70CM (CATHETERS) IMPLANT
CLOTH BEACON ORANGE TIMEOUT ST (SAFETY) ×1 IMPLANT
EXTRACTOR STONE NITINOL NGAGE (UROLOGICAL SUPPLIES) IMPLANT
GLOVE BIO SURGEON STRL SZ7 (GLOVE) IMPLANT
GLOVE BIOGEL PI IND STRL 7.0 (GLOVE) ×2 IMPLANT
GLOVE BIOGEL PI IND STRL 7.5 (GLOVE) IMPLANT
GOWN STRL REUS W/ TWL LRG LVL3 (GOWN DISPOSABLE) ×1 IMPLANT
GOWN STRL REUS W/ TWL XL LVL3 (GOWN DISPOSABLE) ×1 IMPLANT
GUIDEWIRE STR DUAL SENSOR (WIRE) ×1 IMPLANT
IV NS IRRIG 3000ML ARTHROMATIC (IV SOLUTION) ×2 IMPLANT
KIT TURNOVER CYSTO (KITS) ×1 IMPLANT
MANIFOLD NEPTUNE II (INSTRUMENTS) ×1 IMPLANT
PACK CYSTO (CUSTOM PROCEDURE TRAY) ×1 IMPLANT
PAD ARMBOARD 7.5X6 YLW CONV (MISCELLANEOUS) ×1 IMPLANT
POSITIONER HEAD 8X9X4 ADT (SOFTGOODS) ×1 IMPLANT
SYR CONTROL 10ML LL (SYRINGE) IMPLANT

## 2023-06-12 NOTE — Assessment & Plan Note (Addendum)
-   We will continue Lexapro and Wellbutrin XL.

## 2023-06-12 NOTE — Op Note (Signed)
 Preoperative diagnosis: Right ureteral stone Postoperative diagnosis: Same  Procedure: Cystoscopy with right retrograde pyelogram, right ureteroscopy laser lithotripsy and right ureteral stent placement  Surgeon: Mena Goes  Anesthesia: General  Indication for procedure: Bianca [name] is a 38 year old female with a symptomatic right distal ureteral stone that failed to pass.  Findings: On exam the vulva appeared normal without lesion.  The meatus and vagina appeared normal.  On cystoscopy the urethra and bladder were unremarkable.  There was no stone or foreign body in the bladder.  Right retrograde pyelogram-this outlined a single ureter single collecting system unit with a small filling defect in the distal ureter consistent with the stone.  Right ureteroscopy encountered the stone at the UVJ.  The stone was fragmented into 3 pieces and those pieces were sequentially dropped in the bladder.  The 2 largest fragments were sent for analysis.  Description of procedure: After consent was obtained patient brought to the operating room.  After adequate anesthesia she was placed in lithotomy position and prepped and draped in the usual sterile fashion.  Timeout was performed to confirm the patient and procedure.  The cystoscope was passed per urethra and the bladder inspected.  The right ureteral orifice was cannulated with a 6 [place] open-ended catheter and right retrograde injection of contrast was performed in a gentle manner.  A wire was passed up to the right collecting system and coiled in the scope backed out.  A dual channel semirigid ureteroscope was advanced through the right ureteral orifice with the fluid flow on a slow rate.  The stone was encountered.  242 m laser fiber was advanced and the stone was fragmented into 3 pieces at 0.2 and 50.  The stone fragmented and did not dust and therefore I took an engage basket and dropped the largest fragments in the bladder.  Follow-up ureteroscopy up into the  mid ureter noted there to be no other stones and no injury.  The scope was backed out and the wire backloaded on the cystoscope a 624 cm stent was advanced.  The wire was removed with a good coil seen up in the kidney and a good coil in the bladder.  Bladder was drained and the scope removed.  The string was taped to the patient.  She was awakened and taken to the recovery room in stable condition.  Complications: None  Blood loss: Minimal  Specimens to lab: Stone fragments  Drains: 6 x 24 cm right ureteral stent with string  Disposition: Patient stable to PACU-I spoke to [facility] postoperatively and went over the procedure, postop care and follow-up

## 2023-06-12 NOTE — Progress Notes (Signed)
   06/12/23 1110  TOC Brief Assessment  Insurance and Status Reviewed  Patient has primary care physician Yes  Home environment has been reviewed from home  Prior level of function: independent  Prior/Current Home Services No current home services  Social Drivers of Health Review SDOH reviewed no interventions necessary  Readmission risk has been reviewed Yes  Transition of care needs no transition of care needs at this time    Transition of Care Department [facility]) has reviewed patient and no TOC needs have been identified at this time. We will continue to monitor patient advancement through interdisciplinary progression rounds. If new patient transition needs arise, please place a TOC consult.

## 2023-06-12 NOTE — ED Notes (Signed)
 ED TO INPATIENT HANDOFF REPORT  ED Nurse Name and Phone #: Vernard Gambles RN [phone]  S Name/Age/Gender Bianca [name] 38 y.o. female Room/Bed: APA03/APA03  Code Status   Code Status: Full Code  Home/SNF/Other Home Patient oriented to: self, place, time, and situation Is this baseline? Yes   Triage Complete: Triage complete  Chief Complaint Acute unilateral obstructive uropathy [N13.9]  Triage Note Pt with right flank pain and LLQ pain.  + N/V x 4 today. Denies any fever or chills. Pt with hx of kidney stones. + burning with urination.   Allergies Allergies  Allergen Reactions   Codeine Hives and Shortness Of Breath    Tolerates hydromorphone    Level of Care/Admitting Diagnosis ED Disposition     ED Disposition  Admit   Condition  --   Comment  Hospital Area: [facility] [[id number]]  Level of Care: Med-Surg [16]  Covid Evaluation: Asymptomatic - no recent exposure (last 10 days) testing not required  Diagnosis: Acute unilateral obstructive uropathy [454098]  Admitting Physician: Hannah Beat [[id number]]  Attending Physician: Hannah Beat [[id number]]  Certification:: I certify this patient will need inpatient services for at least 2 midnights  Expected Medical Readiness: 06/13/2023          B Medical/Surgery History Past Medical History:  Diagnosis Date   GERD (gastroesophageal reflux disease)    History of kidney stones    History of recurrent UTIs    Hypertension    Kidney stones    Past Surgical History:  Procedure Laterality Date   CYSTOSCOPY WITH STENT PLACEMENT Right 06/14/2019   Procedure: Cystoscopy, Right Retrograde Pyelogram, Right Stent Placement;  Surgeon: Malen Gauze, MD;  Location: [facility];  Service: Urology;  Laterality: Right;   CYSTOSCOPY/URETEROSCOPY/HOLMIUM LASER/STENT PLACEMENT Right 07/08/2019   Procedure: CYSTOSCOPY/RIGHT URETEROSCOPY/RIGHT RETROGRADE PYELOGRAM/STENT EXCHANGE;  Surgeon: Malen Gauze, MD;  Location: [facility];   Service: Urology;  Laterality: Right;   HOLMIUM LASER APPLICATION Right 07/08/2019   Procedure: HOLMIUM LASER LITHOTRIPSY RIGHT URETERAL CALCULUS;  Surgeon: Malen Gauze, MD;  Location: [facility];  Service: Urology;  Laterality: Right;   NO PAST SURGERIES       A IV Location/Drains/Wounds Patient Lines/Drains/Airways Status     Active Line/Drains/Airways     Name Placement date Placement time Site Days   Peripheral IV 06/11/23 20 G Anterior;Left Forearm 06/11/23  1748  Forearm  1   Ureteral Drain/Stent Right ureter 6 Fr. 07/08/19  1029  Right ureter  1435   Wound / Incision (Open or Dehisced) 07/28/18 Puncture 07/28/18  1731  --  1780            Intake/Output Last 24 hours  Intake/Output Summary (Last 24 hours) at 06/12/2023 0730 Last data filed at 06/12/2023 0865 Gross per 24 hour  Intake 1500 ml  Output --  Net 1500 ml    Labs/Imaging Results for orders placed or performed during the hospital encounter of 06/11/23 (from the past 48 hours)  Urinalysis, Routine w reflex microscopic -Urine, Clean Catch     Status: Abnormal   Collection Time: 06/11/23  5:30 PM  Result Value Ref Range   Color, Urine YELLOW YELLOW   APPearance CLEAR CLEAR   Specific Gravity, Urine 1.013 1.005 - 1.030   pH 6.0 5.0 - 8.0   Glucose, UA NEGATIVE NEGATIVE mg/dL   Hgb urine dipstick MODERATE (A) NEGATIVE   Bilirubin Urine NEGATIVE NEGATIVE   Ketones, ur NEGATIVE NEGATIVE mg/dL   Protein, ur NEGATIVE  NEGATIVE mg/dL   Nitrite NEGATIVE NEGATIVE   Leukocytes,Ua NEGATIVE NEGATIVE   RBC / HPF 11-20 0 - 5 RBC/hpf   WBC, UA 6-10 0 - 5 WBC/hpf   Bacteria, UA RARE (A) NONE SEEN   Squamous Epithelial / HPF 0-5 0 - 5 /HPF   Mucus PRESENT     Comment: Performed at [facility], [address] [place], [place] [address]  Pregnancy, urine     Status: None   Collection Time: 06/11/23  5:31 PM  Result Value Ref Range   Preg Test, Ur NEGATIVE NEGATIVE    Comment:        THE SENSITIVITY OF  THIS METHODOLOGY IS >25 mIU/mL. Performed at [facility], [address], [place], [place] [address]   CBC     Status: Abnormal   Collection Time: 06/11/23  5:46 PM  Result Value Ref Range   WBC 12.3 (H) 4.0 - 10.5 K/uL   RBC 4.70 3.87 - 5.11 MIL/uL   Hemoglobin 13.2 12.0 - 15.0 g/dL   HCT 09.8 11.9 - 14.7 %   MCV 83.4 80.0 - 100.0 fL   MCH 28.1 26.0 - 34.0 pg   MCHC 33.7 30.0 - 36.0 g/dL   RDW 82.9 56.2 - 13.0 %   Platelets 403 (H) 150 - 400 K/uL   nRBC 0.0 0.0 - 0.2 %    Comment: Performed at [facility], [address] [place], [place] [address]  Comprehensive metabolic panel     Status: Abnormal   Collection Time: 06/11/23  5:46 PM  Result Value Ref Range   Sodium 138 135 - 145 mmol/L   Potassium 3.6 3.5 - 5.1 mmol/L   Chloride 106 98 - 111 mmol/L   CO2 22 22 - 32 mmol/L   Glucose, Bld 114 (H) 70 - 99 mg/dL    Comment: Glucose reference range applies only to samples taken after fasting for at least 8 hours.   BUN 9 6 - 20 mg/dL   Creatinine, Ser 4.69 0.44 - 1.00 mg/dL   Calcium 9.1 8.9 - 62.9 mg/dL   Total Protein 7.3 6.5 - 8.1 g/dL   Albumin 3.8 3.5 - 5.0 g/dL   AST 20 15 - 41 U/L   ALT 25 0 - 44 U/L   Alkaline Phosphatase 107 38 - 126 U/L   Total Bilirubin 0.5 0.0 - 1.2 mg/dL   GFR, Estimated >52 >84 mL/min    Comment: (NOTE) Calculated using the CKD-EPI Creatinine Equation (2021)    Anion gap 10 5 - 15    Comment: Performed at [facility], [address], [place], [place] [address]  Basic metabolic panel     Status: Abnormal   Collection Time: 06/12/23  5:27 AM  Result Value Ref Range   Sodium 137 135 - 145 mmol/L   Potassium 3.4 (L) 3.5 - 5.1 mmol/L   Chloride 108 98 - 111 mmol/L   CO2 21 (L) 22 - 32 mmol/L   Glucose, Bld 119 (H) 70 - 99 mg/dL    Comment: Glucose reference range applies only to samples taken after fasting for at least 8 hours.   BUN 11 6 - 20 mg/dL   Creatinine, Ser 0.10 0.44 - 1.00 mg/dL   Calcium 7.5 (L) 8.9 - 10.3 mg/dL   GFR,  Estimated >27 >25 mL/min    Comment: (NOTE) Calculated using the CKD-EPI Creatinine Equation (2021)    Anion gap 8 5 - 15    Comment: Performed at [facility] [facility], [address] [place], [place] [address]  CBC     Status: Abnormal   Collection Time: 06/12/23  5:27 AM  Result Value Ref Range   WBC 13.9 (H) 4.0 - 10.5 K/uL   RBC 4.28 3.87 - 5.11 MIL/uL   Hemoglobin 12.1 12.0 - 15.0 g/dL   HCT 60.4 54.0 - 98.1 %   MCV 86.0 80.0 - 100.0 fL   MCH 28.3 26.0 - 34.0 pg   MCHC 32.9 30.0 - 36.0 g/dL   RDW 19.1 47.8 - 29.5 %   Platelets 331 150 - 400 K/uL   nRBC 0.0 0.0 - 0.2 %    Comment: Performed at [facility], [address] [place], [place] [address]   US PELVIC COMPLETE W TRANSVAGINAL AND TORSION R/O Result Date: 06/11/2023 CLINICAL DATA:  Right lower quadrant pain EXAM: TRANSABDOMINAL AND TRANSVAGINAL ULTRASOUND OF PELVIS DOPPLER ULTRASOUND OF OVARIES TECHNIQUE: Both transabdominal and transvaginal ultrasound examinations of the pelvis were performed. Transabdominal technique was performed for global imaging of the pelvis including uterus, ovaries, adnexal regions, and pelvic cul-de-sac. It was necessary to proceed with endovaginal exam following the transabdominal exam to visualize the uterus, endometrium, ovaries and adnexa. Color and duplex Doppler ultrasound was utilized to evaluate blood flow to the ovaries. COMPARISON:  CT today. FINDINGS: Uterus Measurements: 9.0 x 4.6 x 5.4 cm = volume: 118 mL. No fibroids or other mass visualized. Endometrium Thickness: 19 mm in thickness.  No focal abnormality visualized. Right ovary Measurements: Large hypoechoic area in the right adnexa measuring 7.0 x 6.9 x 5.0 cm. This has low level internal echoes and is likely within the right ovary. This could reflect a hemorrhagic cyst or endometrioma. Left ovary Measurements: Cystic structure in the left adnexa measures 3.3 x 3.3 x 3.8 cm, likely within the left ovary. This appears to be a simple cyst. Pulsed  Doppler evaluation of both ovaries demonstrates normal low-resistance arterial and venous waveforms. Other findings No abnormal free fluid. IMPRESSION: Complex hypoechoic area in the right ovary measuring up to 7 cm, most likely hemorrhagic cyst or endometrioma. 3.8 cm simple appearing cyst in the left ovary. These findings could be followed with repeat ultrasound in 3 months to assess stability. Electronically Signed   By: Charlett Nose M.D.   On: 06/11/2023 23:13   CT Renal Stone Study Result Date: 06/11/2023 CLINICAL DATA:  Right flank pain, left lower quadrant pain. Nausea, vomiting. EXAM: CT ABDOMEN AND PELVIS WITHOUT CONTRAST TECHNIQUE: Multidetector CT imaging of the abdomen and pelvis was performed following the standard protocol without IV contrast. RADIATION DOSE REDUCTION: This exam was performed according to the departmental dose-optimization program which includes automated exposure control, adjustment of the mA and/or kV according to patient size and/or use of iterative reconstruction technique. COMPARISON:  07/03/2019 FINDINGS: Lower chest: No acute abnormality Hepatobiliary: No focal hepatic abnormality. Gallbladder unremarkable. Pancreas: No focal abnormality or ductal dilatation. Spleen: No focal abnormality.  Normal size. Adrenals/Urinary Tract: Adrenal glands normal. Moderate right hydronephrosis and hydroureter due to 4 mm distal right ureteral stone. Left lower pole nonobstructing nephrolithiasis. No hydronephrosis on the left. Urinary bladder normal. Stomach/Bowel: Normal appendix. Stomach, large and small bowel grossly unremarkable. Vascular/Lymphatic: No evidence of aneurysm or adenopathy. Reproductive: 7 cm mass in the right adnexa. It is difficult to determine if this is an exophytic/pedunculated fibroid or a right ovarian hemorrhagic cyst. 3.2 cm cyst in the left ovary. Other: No free fluid or free air. Musculoskeletal: No acute bony abnormality. IMPRESSION: 4  mm distal right ureteral  stone with moderate right hydronephrosis and hydroureter. Left nephrolithiasis. 7 cm mass in the right adnexal region. This could reflect a pedunculated fibroid or right ovarian lesion, possibly hemorrhagic cyst. This could be further evaluated with pelvic ultrasound. Electronically Signed   By: Charlett Nose M.D.   On: 06/11/2023 20:33    Pending Labs Unresulted Labs (From admission, onward)     Start     Ordered   06/12/23 0037  HIV Antibody (routine testing w rflx)  (HIV Antibody (Routine testing w reflex) panel)  Once,   R        06/12/23 0037            Vitals/Pain Today's Vitals   06/12/23 0526 06/12/23 0545 06/12/23 0720 06/12/23 0723  BP: 130/83 132/89 114/89   Pulse: 88 71 65   Resp: 18 18 16    Temp:   98.1 F (36.7 C)   TempSrc:   Oral   SpO2: 90% 92% 95%   Weight:      Height:      PainSc:    4     Isolation Precautions No active isolations  Medications Medications  ketorolac (TORADOL) 30 MG/ML injection 30 mg (30 mg Intravenous Given 06/12/23 0021)  enoxaparin (LOVENOX) injection 40 mg (has no administration in time range)  0.9 %  sodium chloride infusion ( Intravenous New Bag/Given 06/12/23 0108)  acetaminophen (TYLENOL) tablet 650 mg (has no administration in time range)    Or  acetaminophen (TYLENOL) suppository 650 mg (has no administration in time range)  traZODone (DESYREL) tablet 25 mg (has no administration in time range)  ondansetron (ZOFRAN) tablet 4 mg ( Oral See Alternative 06/12/23 0318)    Or  ondansetron (ZOFRAN) injection 4 mg (4 mg Intravenous Given 06/12/23 0318)  magnesium hydroxide (MILK OF MAGNESIA) suspension 30 mL (has no administration in time range)  HYDROmorphone (DILAUDID) injection 1 mg (1 mg Intravenous Given 06/12/23 0318)  oxyCODONE-acetaminophen (PERCOCET) 7.5-325 MG per tablet 1 tablet (has no administration in time range)  amLODipine (NORVASC) tablet 5 mg (has no administration in time range)  tamsulosin (FLOMAX) capsule 0.4 mg  (has no administration in time range)  benzonatate (TESSALON) capsule 200 mg (has no administration in time range)  loratadine (CLARITIN) tablet 10 mg (has no administration in time range)  albuterol (PROVENTIL) (2.5 MG/3ML) 0.083% nebulizer solution 2.5 mg (has no administration in time range)  ketorolac (TORADOL) 15 MG/ML injection 15 mg (15 mg Intravenous Given 06/11/23 1748)  ondansetron (ZOFRAN) injection 4 mg (4 mg Intravenous Given 06/11/23 1748)  HYDROmorphone (DILAUDID) injection 1 mg (1 mg Intravenous Given 06/11/23 1815)  HYDROmorphone (DILAUDID) injection 0.5 mg (0.5 mg Intravenous Given 06/11/23 1949)  ondansetron (ZOFRAN) injection 4 mg (4 mg Intravenous Given 06/11/23 2126)  HYDROmorphone (DILAUDID) injection 1 mg (1 mg Intravenous Given 06/11/23 2126)  sodium chloride 0.9 % bolus 1,000 mL (0 mLs Intravenous Stopped 06/12/23 0015)  HYDROmorphone (DILAUDID) injection 2 mg (2 mg Intravenous Given 06/11/23 2325)  sodium chloride 0.9 % bolus 1,000 mL (1,000 mLs Intravenous New Bag/Given 06/12/23 0126)  droperidol (INAPSINE) 2.5 MG/ML injection 1.25 mg (1.25 mg Intravenous Given 06/12/23 0126)  HYDROmorphone (DILAUDID) injection 1 mg (1 mg Intravenous Given 06/12/23 0523)  sodium chloride 0.9 % bolus 500 mL (0 mLs Intravenous Stopped 06/12/23 0723)    Mobility walks     Focused Assessments Renal Assessment Handoff:       R Recommendations: See Admitting Provider Note  Report  given to:   Additional Notes: Hard to achieve pain control

## 2023-06-12 NOTE — Plan of Care (Signed)

## 2023-06-12 NOTE — Anesthesia Preprocedure Evaluation (Signed)
 Anesthesia Evaluation  Patient identified by MRN, date of birth, ID band Patient awake    Reviewed: Allergy & Precautions, H&P , NPO status , Patient's Chart, lab work & pertinent test results, reviewed documented beta blocker date and time   Airway Mallampati: II  TM Distance: >3 FB Neck ROM: full    Dental no notable dental hx.    Pulmonary neg pulmonary ROS, Current Smoker   Pulmonary exam normal breath sounds clear to auscultation       Cardiovascular Exercise Tolerance: Good hypertension, negative cardio ROS  Rhythm:regular Rate:Normal     Neuro/Psych  PSYCHIATRIC DISORDERS  Depression    negative neurological ROS  negative psych ROS   GI/Hepatic negative GI ROS, Neg liver ROS,GERD  ,,  Endo/Other  negative endocrine ROS    Renal/GU Renal diseasenegative Renal ROS  negative genitourinary   Musculoskeletal   Abdominal   Peds  Hematology negative hematology ROS (+)   Anesthesia Other Findings   Reproductive/Obstetrics negative OB ROS                             Anesthesia Physical Anesthesia Plan  ASA: 2 and emergent  Anesthesia Plan: General and General LMA   Post-op Pain Management:    Induction:   PONV Risk Score and Plan: Ondansetron  Airway Management Planned:   Additional Equipment:   Intra-op Plan:   Post-operative Plan:   Informed Consent: I have reviewed the patients History and Physical, chart, labs and discussed the procedure including the risks, benefits and alternatives for the proposed anesthesia with the patient or authorized representative who has indicated his/her understanding and acceptance.     Dental Advisory Given  Plan Discussed with: CRNA  Anesthesia Plan Comments:        Anesthesia Quick Evaluation

## 2023-06-12 NOTE — Assessment & Plan Note (Signed)
 -  We will continue Flomax

## 2023-06-12 NOTE — Assessment & Plan Note (Signed)
-   We will continue antihypertensive therapy.

## 2023-06-12 NOTE — Discharge Instructions (Signed)
 Removal of stent-remove the stent on Thursday morning, 06/14/2022 by pulling the string slowly as instructed

## 2023-06-12 NOTE — Transfer of Care (Signed)
 Immediate Anesthesia Transfer of Care Note  Patient: Bianca [name]  Procedure(s) Performed: CYSTOSCOPY/RETROGRADE/URETEROSCOPY/STONE EXTRACTION WITH BASKET AND STENT PLACEMENT (Right: Ureter) HOLMIUM LASER APPLICATION (Right: Ureter)  Patient Location: PACU  Anesthesia Type:General  Level of Consciousness: drowsy and patient cooperative  Airway & Oxygen Therapy: Patient Spontanous Breathing and Patient connected to nasal cannula oxygen  Post-op Assessment: Report given to RN and Post -op Vital signs reviewed and stable  Post vital signs: Reviewed and stable  Last Vitals:  Vitals Value Taken Time  BP 126/89 06/12/23 1538  Temp 36.5 C 06/12/23 1538  Pulse 102 06/12/23 1542  Resp 12 06/12/23 1542  SpO2 100 % 06/12/23 1542  Vitals shown include unfiled device data.  Last Pain:  Vitals:   06/12/23 1354  TempSrc: Oral  PainSc: 7          Complications: No notable events documented.

## 2023-06-12 NOTE — Progress Notes (Signed)
 Progress Note   Patient: Bianca [name] NWG:[id number] DOB: 1985/06/16 DOA: 06/11/2023     1 DOS: the patient was seen and examined on 06/12/2023   Brief hospital course: 38 year old woman with PMH of GERD, HTN, nephrolithiasis, recurrent UTI who presented to the ED with right flank pain, nausea, vomiting and was found to have right ureteral stone with moderate right hydronephrosis and hydroureter.  Urology was consulted.  Assessment and Plan:  Right obstructing ureteral stone. Patient presented with right flank pain. History of nephrolithiasis in the past. CT showed a distal right ureteral stone with moderate right hydronephrosis and hydroureter.  Patient also has a left lower pole nonobstructing stone.  No hydronephrosis on the left. Creatinine at baseline. Urology consulted, input appreciated. -For cystoscopy, retrograde ureteroscopy, stone extraction today. -Continue pain management with as needed Tylenol, oxycodone, Toradol, hydromorphone.  Hypokalemia Due to vomiting. -Will replete as needed.  Right ovarian cyst Patient states she is following up with a gynecologist and has an excision procedure scheduled. -Outpatient follow-up.  HTN -Continue home Norvasc.      Subjective: Patient complains of right flank pain.  States it is controlled with pain medication.  Nausea and vomiting have improved.  Physical Exam: Vitals:   06/12/23 0814 06/12/23 1210 06/12/23 1354 06/12/23 1401  BP: 125/78 125/83 120/63   Pulse: 71 69 70   Resp: 17 16 11    Temp: 98.4 F (36.9 C)  98.5 F (36.9 C)   TempSrc: Oral  Oral   SpO2: 97% 99% 97%   Weight:    77.1 kg  Height:    5\' 2"  (1.575 m)    General: Alert, oriented X3  Eyes: Pupils equal, reactive  Oral cavity: moist mucous membranes  Head: Atraumatic, normocephalic  Neck: supple  Chest: clear to auscultation. No crackles, no wheezes  CVS: S1,S2 RRR. No murmurs  Abd: No distention, soft, non-tender. No masses palpable  Extr: No  edema   MSK: No joint deformities or swelling  Neurological: Grossly intact.    Data Reviewed:     Latest Ref Rng & Units 06/12/2023    5:27 AM 06/11/2023    5:46 PM 09/22/2020    5:17 AM  CBC  WBC 4.0 - 10.5 K/uL 13.9  12.3  16.4   Hemoglobin 12.0 - 15.0 g/dL 57.8  46.9  62.9   Hematocrit 36.0 - 46.0 % 36.8  39.2  42.9   Platelets 150 - 400 K/uL 331  403  334       Latest Ref Rng & Units 06/12/2023    5:27 AM 06/11/2023    5:46 PM 09/22/2020    5:17 AM  BMP  Glucose 70 - 99 mg/dL 528  413  244   BUN 6 - 20 mg/dL 11  9  11    Creatinine 0.44 - 1.00 mg/dL 0.10  2.72  5.36   Sodium 135 - 145 mmol/L 137  138  137   Potassium 3.5 - 5.1 mmol/L 3.4  3.6  3.7   Chloride 98 - 111 mmol/L 108  106  97   CO2 22 - 32 mmol/L 21  22  26    Calcium 8.9 - 10.3 mg/dL 7.5  9.1  8.6      Family Communication: n/a  Disposition: Status is: Inpatient Remains inpatient appropriate because: For definitive management of obstructing right ureteral stone  Planned Discharge Destination: Home DVT prophylaxis: SQ Lovenox     Time spent: 40 minutes  Author: Marcine Matar, MD 06/12/2023 3:33  PM  For on call review [email or web address].

## 2023-06-12 NOTE — Anesthesia Procedure Notes (Signed)
 Procedure Name: LMA Insertion Date/Time: 06/12/2023 2:58 PM  Performed by: Oletha Cruel, CRNAPre-anesthesia Checklist: Patient identified, Emergency Drugs available, Suction available and Patient being monitored Patient Re-evaluated:Patient Re-evaluated prior to induction Oxygen Delivery Method: Circle system utilized Preoxygenation: Pre-oxygenation with 100% oxygen Induction Type: IV induction Ventilation: Mask ventilation without difficulty LMA: LMA inserted LMA Size: 4.0 Number of attempts: 1 Placement Confirmation: positive ETCO2, CO2 detector and breath sounds checked- equal and bilateral Tube secured with: Tape Dental Injury: Teeth and Oropharynx as per pre-operative assessment  Comments: Atraumatic insertion of LMA size 4. Lips ans teeth remain in preoperative condition.

## 2023-06-12 NOTE — Consult Note (Signed)
 Consultation: right distal stone Requested by: Dr. Johann Capers Mdala-Gausi   History of Present Illness: Bianca [name] is a 38 y.o. Caucasian female with medical history significant for GERD, essential hypertension, urolithiasis and history of recurrent UTI, who presented to the emergency room with acute onset of severe right flank pain since Friday with associated intermittent nausea and vomiting.  She denied any fever or chills.  She admits to mild dysuria without hematuria, urinary frequency or urgency.  No chest pain or dyspnea or cough or wheezing.   ED Course: When the patient came to the ER, BP was 148/102 with a heart rate of 109.  Labs revealed unremarkable CMP.  CBC showed leukocytosis 12.3 and thrombocytosis of 403.  Urine pregnancy test was negative.  Urinalysis was remarkable for 6-10 WBCs with rare bacteria, 11-20 RBCs and negative nitrites and negative leukocytes. EKG as reviewed by me : None Imaging: CT renal stone showed the following: 4 mm distal right ureteral stone with moderate right hydronephrosis and hydroureter. No right renal stone. LLP stones. A 7 cm right adnexal mass.   She was admitted and has remained afebrile with stable vitals. WBC 13.9, cr 0.64. She is on tamsulosin.  She continues to have right flank pain and right lower quadrant pain.  She has been straining and not seen a stone pass. She denies any fever, chills or dysuria.   Past Medical History:  Diagnosis Date   GERD (gastroesophageal reflux disease)    History of kidney stones    History of recurrent UTIs    Hypertension    Kidney stones    Past Surgical History:  Procedure Laterality Date   CYSTOSCOPY WITH STENT PLACEMENT Right 06/14/2019   Procedure: Cystoscopy, Right Retrograde Pyelogram, Right Stent Placement;  Surgeon: Malen Gauze, MD;  Location: [facility];  Service: Urology;  Laterality: Right;   CYSTOSCOPY/URETEROSCOPY/HOLMIUM LASER/STENT PLACEMENT Right 07/08/2019   Procedure: CYSTOSCOPY/RIGHT  URETEROSCOPY/RIGHT RETROGRADE PYELOGRAM/STENT EXCHANGE;  Surgeon: Malen Gauze, MD;  Location: [facility];  Service: Urology;  Laterality: Right;   HOLMIUM LASER APPLICATION Right 07/08/2019   Procedure: HOLMIUM LASER LITHOTRIPSY RIGHT URETERAL CALCULUS;  Surgeon: Malen Gauze, MD;  Location: [facility];  Service: Urology;  Laterality: Right;   NO PAST SURGERIES      Home Medications:  Medications Prior to Admission  Medication Sig Dispense Refill Last Dose/Taking   amLODipine (NORVASC) 10 MG tablet Take 10 mg by mouth daily.   Past Week   Ibuprofen (MOTRIN PO) Take 1 tablet by mouth every 8 (eight) hours as needed (pain).   Taking As Needed   lisinopril (ZESTRIL) 20 MG tablet Take 20 mg by mouth daily.   Past Week   ondansetron (ZOFRAN-ODT) 4 MG disintegrating tablet Take 4 mg by mouth every 4 (four) hours as needed for nausea or vomiting.   Past Week   spironolactone (ALDACTONE) 25 MG tablet Take 25 mg by mouth daily.   Past Week   tamsulosin (FLOMAX) 0.4 MG CAPS capsule Take 0.4 mg by mouth daily as needed (renalcolic).   Past Week   loratadine (CLARITIN) 10 MG tablet Take 10 mg by mouth daily. (Patient not taking: Reported on 06/12/2023)   Not Taking   Allergies:  Allergies  Allergen Reactions   Codeine Hives and Shortness Of Breath    Tolerates hydromorphone    Family History  Problem Relation Age of Onset   Heart disease Paternal Grandmother    Cancer Paternal Grandmother        breast  Thyroid disease Father    Cancer Father        lung   Hodgkin's lymphoma Father    Sudden Cardiac Death Father        Was not sure if was an MI or not.  Was during cancer treatment.  No autopsy done   Kidney disease Mother        GN3N   Other Maternal Grandmother        Guillian Barre syndrome   Social History:  reports that she has been smoking cigarettes. She started smoking about 19 years ago. She has a 5 pack-year smoking history. She has never used smokeless tobacco. She reports  that she does not drink alcohol and does not use drugs.  ROS: A complete review of systems was performed.  All systems are negative except for pertinent findings as noted. Review of Systems  All other systems reviewed and are negative.    Physical Exam:  Vital signs in last 24 hours: Temp:  [98.1 F (36.7 C)-98.4 F (36.9 C)] 98.4 F (36.9 C) (02/17 0814) Pulse Rate:  [65-116] 69 (02/17 1210) Resp:  [16-18] 16 (02/17 1210) BP: (114-156)/(78-102) 125/83 (02/17 1210) SpO2:  [90 %-99 %] 99 % (02/17 1210) Weight:  [77.1 kg] 77.1 kg (02/16 1727) General:  Alert and oriented, No acute distress, lying on her left side clutching her right side HEENT: Normocephalic, atraumatic Cardiovascular: Regular rate and rhythm Lungs: Regular rate and effort Abdomen: Soft, nontender, nondistended, no abdominal masses Back: No CVA tenderness Extremities: No edema Neurologic: Grossly intact  Laboratory Data:  Results for orders placed or performed during the hospital encounter of 06/11/23 (from the past 24 hours)  Urinalysis, Routine w reflex microscopic -Urine, Clean Catch     Status: Abnormal   Collection Time: 06/11/23  5:30 PM  Result Value Ref Range   Color, Urine YELLOW YELLOW   APPearance CLEAR CLEAR   Specific Gravity, Urine 1.013 1.005 - 1.030   pH 6.0 5.0 - 8.0   Glucose, UA NEGATIVE NEGATIVE mg/dL   Hgb urine dipstick MODERATE (A) NEGATIVE   Bilirubin Urine NEGATIVE NEGATIVE   Ketones, ur NEGATIVE NEGATIVE mg/dL   Protein, ur NEGATIVE NEGATIVE mg/dL   Nitrite NEGATIVE NEGATIVE   Leukocytes,Ua NEGATIVE NEGATIVE   RBC / HPF 11-20 0 - 5 RBC/hpf   WBC, UA 6-10 0 - 5 WBC/hpf   Bacteria, UA RARE (A) NONE SEEN   Squamous Epithelial / HPF 0-5 0 - 5 /HPF   Mucus PRESENT   Pregnancy, urine     Status: None   Collection Time: 06/11/23  5:31 PM  Result Value Ref Range   Preg Test, Ur NEGATIVE NEGATIVE  CBC     Status: Abnormal   Collection Time: 06/11/23  5:46 PM  Result Value Ref  Range   WBC 12.3 (H) 4.0 - 10.5 K/uL   RBC 4.70 3.87 - 5.11 MIL/uL   Hemoglobin 13.2 12.0 - 15.0 g/dL   HCT 40.9 81.1 - 91.4 %   MCV 83.4 80.0 - 100.0 fL   MCH 28.1 26.0 - 34.0 pg   MCHC 33.7 30.0 - 36.0 g/dL   RDW 78.2 95.6 - 21.3 %   Platelets 403 (H) 150 - 400 K/uL   nRBC 0.0 0.0 - 0.2 %  Comprehensive metabolic panel     Status: Abnormal   Collection Time: 06/11/23  5:46 PM  Result Value Ref Range   Sodium 138 135 - 145 mmol/L   Potassium 3.6 3.5 -  5.1 mmol/L   Chloride 106 98 - 111 mmol/L   CO2 22 22 - 32 mmol/L   Glucose, Bld 114 (H) 70 - 99 mg/dL   BUN 9 6 - 20 mg/dL   Creatinine, Ser 4.09 0.44 - 1.00 mg/dL   Calcium 9.1 8.9 - 81.1 mg/dL   Total Protein 7.3 6.5 - 8.1 g/dL   Albumin 3.8 3.5 - 5.0 g/dL   AST 20 15 - 41 U/L   ALT 25 0 - 44 U/L   Alkaline Phosphatase 107 38 - 126 U/L   Total Bilirubin 0.5 0.0 - 1.2 mg/dL   GFR, Estimated >91 >47 mL/min   Anion gap 10 5 - 15  HIV Antibody (routine testing w rflx)     Status: None   Collection Time: 06/12/23  5:27 AM  Result Value Ref Range   HIV Screen 4th Generation wRfx Non Reactive Non Reactive  Basic metabolic panel     Status: Abnormal   Collection Time: 06/12/23  5:27 AM  Result Value Ref Range   Sodium 137 135 - 145 mmol/L   Potassium 3.4 (L) 3.5 - 5.1 mmol/L   Chloride 108 98 - 111 mmol/L   CO2 21 (L) 22 - 32 mmol/L   Glucose, Bld 119 (H) 70 - 99 mg/dL   BUN 11 6 - 20 mg/dL   Creatinine, Ser 8.29 0.44 - 1.00 mg/dL   Calcium 7.5 (L) 8.9 - 10.3 mg/dL   GFR, Estimated >56 >21 mL/min   Anion gap 8 5 - 15  CBC     Status: Abnormal   Collection Time: 06/12/23  5:27 AM  Result Value Ref Range   WBC 13.9 (H) 4.0 - 10.5 K/uL   RBC 4.28 3.87 - 5.11 MIL/uL   Hemoglobin 12.1 12.0 - 15.0 g/dL   HCT 30.8 65.7 - 84.6 %   MCV 86.0 80.0 - 100.0 fL   MCH 28.3 26.0 - 34.0 pg   MCHC 32.9 30.0 - 36.0 g/dL   RDW 96.2 95.2 - 84.1 %   Platelets 331 150 - 400 K/uL   nRBC 0.0 0.0 - 0.2 %   No results found for this or any  previous visit (from the past 240 hours). Creatinine: Recent Labs    06/11/23 1746 06/12/23 0527  CREATININE 0.72 0.64    Impression/Assessment:  Right distal ureteral stone, left lower pole stones-  Plan:  I discussed with the patient the nature, potential benefits, risks and alternatives to cystoscopy, right retrograde, right ureteroscopy with ureteral stent possible laser lithotripsy, including side effects of the proposed treatment, the likelihood of the patient achieving the goals of the procedure, and any potential problems that might occur during the procedure or recuperation.  Discussed she may need a staged procedure and the rationale for a staged procedure.  Also discussed the left lower pole stones and she does not want to risk a stent on that side.  She could consider shockwave on the LLP stone(s) in the outpatient if visible.  All questions answered. Patient elects to proceed with above.    Jerilee Field 06/12/2023, 12:34 PM

## 2023-06-12 NOTE — Progress Notes (Signed)
 Patient return from her procedure

## 2023-06-12 NOTE — Assessment & Plan Note (Addendum)
-   The patient was admitted to a medical-surgical bed. - This is secondary to right ureteral stone. - Pain management will be provided. - Urology consult to be obtained. - I sent a timed message for Dr. Berneice Heinrich in AM.

## 2023-06-13 ENCOUNTER — Encounter (HOSPITAL_COMMUNITY): Payer: Self-pay | Admitting: Urology

## 2023-06-13 DIAGNOSIS — N83201 Unspecified ovarian cyst, right side: Secondary | ICD-10-CM

## 2023-06-13 DIAGNOSIS — N139 Obstructive and reflux uropathy, unspecified: Secondary | ICD-10-CM

## 2023-06-13 DIAGNOSIS — N201 Calculus of ureter: Secondary | ICD-10-CM | POA: Diagnosis not present

## 2023-06-13 LAB — CBC
HCT: 37.9 % (ref 36.0–46.0)
Hemoglobin: 12.3 g/dL (ref 12.0–15.0)
MCH: 27.9 pg (ref 26.0–34.0)
MCHC: 32.5 g/dL (ref 30.0–36.0)
MCV: 85.9 fL (ref 80.0–100.0)
Platelets: 425 10*3/uL — ABNORMAL HIGH (ref 150–400)
RBC: 4.41 MIL/uL (ref 3.87–5.11)
RDW: 15.3 % (ref 11.5–15.5)
WBC: 15.4 10*3/uL — ABNORMAL HIGH (ref 4.0–10.5)
nRBC: 0 % (ref 0.0–0.2)

## 2023-06-13 LAB — BASIC METABOLIC PANEL
Anion gap: 12 (ref 5–15)
BUN: 8 mg/dL (ref 6–20)
CO2: 22 mmol/L (ref 22–32)
Calcium: 8.5 mg/dL — ABNORMAL LOW (ref 8.9–10.3)
Chloride: 104 mmol/L (ref 98–111)
Creatinine, Ser: 0.63 mg/dL (ref 0.44–1.00)
GFR, Estimated: >60 mL/min (ref >60)
Glucose, Bld: 123 mg/dL — ABNORMAL HIGH (ref 70–99)
Potassium: 4.2 mmol/L (ref 3.5–5.1)
Sodium: 138 mmol/L (ref 135–145)

## 2023-06-13 MED ORDER — METHOCARBAMOL 500 MG PO TABS
500.0000 mg | ORAL_TABLET | Freq: Three times a day (TID) | ORAL | Status: DC | PRN
  Administered 2023-06-13: 500 mg via ORAL
  Filled 2023-06-13: qty 1

## 2023-06-13 MED ORDER — KETOROLAC TROMETHAMINE 30 MG/ML IJ SOLN
15.0000 mg | Freq: Two times a day (BID) | INTRAMUSCULAR | Status: DC | PRN
  Administered 2023-06-13: 15 mg via INTRAVENOUS
  Filled 2023-06-13 (×2): qty 1

## 2023-06-13 MED ORDER — ACETAMINOPHEN 325 MG PO TABS: 650.0000 mg | ORAL_TABLET | Freq: Four times a day (QID) | ORAL | 0 refills | Status: DC | PRN

## 2023-06-13 NOTE — Plan of Care (Signed)
  Problem: Health Behavior/Discharge Planning: Goal: Ability to manage health-related needs will improve Outcome: Progressing   Problem: Clinical Measurements: Goal: Ability to maintain clinical measurements within normal limits will improve Outcome: Progressing   Problem: Safety: Goal: Ability to remain free from injury will improve Outcome: Progressing   Problem: Safety: Goal: Ability to remain free from injury will improve Outcome: Progressing   Problem: Safety: Goal: Ability to remain free from injury will improve Outcome: Progressing

## 2023-06-13 NOTE — Discharge Summary (Signed)
 Physician Discharge Summary  Patient: Bianca [name] ZOX:[id number] DOB: [birth year]   Code Status: Full Code Admit date: 06/11/2023 Discharge date: 06/13/2023 Disposition: Home, No home health services recommended PCP: [facility]  Recommendations for Outpatient Follow-up:  Follow up with PCP within 1-2 weeks Regarding general hospital follow up and preventative care Follow up with urology   Discharge Diagnoses:  Principal Problem:   Acute unilateral obstructive uropathy Active Problems:   Benign essential hypertension, antepartum   Depression  Brief Hospital Course Summary: 38 year old woman with PMH of GERD, HTN, nephrolithiasis, recurrent UTI who presented to the ED with right flank pain, nausea, vomiting and was found to have right ureteral stone with moderate right hydronephrosis and hydroureter. Urology was consulted.   Right obstructing ureteral stone. Patient presented with right flank pain. History of nephrolithiasis in the past. CT showed a distal right ureteral stone with moderate right hydronephrosis and hydroureter.  Patient also has a left lower pole nonobstructing stone.  No hydronephrosis on the left. Creatinine at baseline. Urology consulted, input appreciated. -For cystoscopy, retrograde ureteroscopy, stone extraction today. -Continue pain management with as needed Tylenol, oxycodone, Toradol, hydromorphone.  All other chronic conditions were treated with home medications.    Discharge Condition: {DISCHARGE CONDITION:19696}, improved Recommended discharge diet: {Discharge WJXB:[id number]}  Consultations: ***  Procedures/Studies: ***  Discharge Instructions     Discharge patient   Complete by: As directed    Discharge disposition: 01-Home or Self Care   Discharge patient date: 06/13/2023      Allergies as of 06/13/2023       Reactions   Codeine Hives, Shortness Of Breath   Tolerates hydromorphone        Medication List      STOP taking these medications    lisinopril 20 MG tablet Commonly known as: ZESTRIL   loratadine 10 MG tablet Commonly known as: CLARITIN   spironolactone 25 MG tablet Commonly known as: ALDACTONE       TAKE these medications    acetaminophen 325 MG tablet Commonly known as: TYLENOL Take 2 tablets (650 mg total) by mouth every 6 (six) hours as needed for mild pain (pain score 1-3) (or Fever >/= 101).   amLODipine 10 MG tablet Commonly known as: NORVASC Take 10 mg by mouth daily.   MOTRIN PO Take 1 tablet by mouth every 8 (eight) hours as needed (pain).   ondansetron 4 MG disintegrating tablet Commonly known as: ZOFRAN-ODT Take 4 mg by mouth every 4 (four) hours as needed for nausea or vomiting.   tamsulosin 0.4 MG Caps capsule Commonly known as: FLOMAX Take 0.4 mg by mouth daily as needed (renalcolic).        Follow-up Information     McKenzie, Mardene Celeste, MD. Call.   Specialty: Urology Why: you will need to be seen in about 6 weeks with an ultrasound of your kidneys and a plain xray. Contact information: [address] [place] [address] [phone]                Subjective   Pt reports some residual burning with urination. Pain in right flank  All questions and concerns were addressed at time of discharge.  Objective  Blood pressure 131/73, pulse 68, temperature 98.2 F (36.8 C), temperature source Oral, resp. rate 18, height 5\' 2"  (1.575 m), weight 77.1 kg, last menstrual period 06/02/2023, SpO2 97%.   General: Pt is alert, awake, not in acute distress Cardiovascular: RRR, S1/S2 +, no  rubs, no gallops Respiratory: CTA bilaterally, no wheezing, no rhonchi Abdominal: Soft, NT, ND, bowel sounds + Extremities: no edema, no cyanosis  The results of significant diagnostics from this hospitalization (including imaging, microbiology, ancillary and laboratory) are listed below for reference.   Imaging studies: DG C-Arm 1-60  Min-No Report Result Date: 06/12/2023 Fluoroscopy was utilized by the requesting physician.  No radiographic interpretation.   US PELVIC COMPLETE W TRANSVAGINAL AND TORSION R/O Result Date: 06/11/2023 CLINICAL DATA:  Right lower quadrant pain EXAM: TRANSABDOMINAL AND TRANSVAGINAL ULTRASOUND OF PELVIS DOPPLER ULTRASOUND OF OVARIES TECHNIQUE: Both transabdominal and transvaginal ultrasound examinations of the pelvis were performed. Transabdominal technique was performed for global imaging of the pelvis including uterus, ovaries, adnexal regions, and pelvic cul-de-sac. It was necessary to proceed with endovaginal exam following the transabdominal exam to visualize the uterus, endometrium, ovaries and adnexa. Color and duplex Doppler ultrasound was utilized to evaluate blood flow to the ovaries. COMPARISON:  CT today. FINDINGS: Uterus Measurements: 9.0 x 4.6 x 5.4 cm = volume: 118 mL. No fibroids or other mass visualized. Endometrium Thickness: 19 mm in thickness.  No focal abnormality visualized. Right ovary Measurements: Large hypoechoic area in the right adnexa measuring 7.0 x 6.9 x 5.0 cm. This has low level internal echoes and is likely within the right ovary. This could reflect a hemorrhagic cyst or endometrioma. Left ovary Measurements: Cystic structure in the left adnexa measures 3.3 x 3.3 x 3.8 cm, likely within the left ovary. This appears to be a simple cyst. Pulsed Doppler evaluation of both ovaries demonstrates normal low-resistance arterial and venous waveforms. Other findings No abnormal free fluid. IMPRESSION: Complex hypoechoic area in the right ovary measuring up to 7 cm, most likely hemorrhagic cyst or endometrioma. 3.8 cm simple appearing cyst in the left ovary. These findings could be followed with repeat ultrasound in 3 months to assess stability. Electronically Signed   By: Charlett Nose M.D.   On: 06/11/2023 23:13   CT Renal Stone Study Result Date: 06/11/2023 CLINICAL DATA:  Right flank  pain, left lower quadrant pain. Nausea, vomiting. EXAM: CT ABDOMEN AND PELVIS WITHOUT CONTRAST TECHNIQUE: Multidetector CT imaging of the abdomen and pelvis was performed following the standard protocol without IV contrast. RADIATION DOSE REDUCTION: This exam was performed according to the departmental dose-optimization program which includes automated exposure control, adjustment of the mA and/or kV according to patient size and/or use of iterative reconstruction technique. COMPARISON:  07/03/2019 FINDINGS: Lower chest: No acute abnormality Hepatobiliary: No focal hepatic abnormality. Gallbladder unremarkable. Pancreas: No focal abnormality or ductal dilatation. Spleen: No focal abnormality.  Normal size. Adrenals/Urinary Tract: Adrenal glands normal. Moderate right hydronephrosis and hydroureter due to 4 mm distal right ureteral stone. Left lower pole nonobstructing nephrolithiasis. No hydronephrosis on the left. Urinary bladder normal. Stomach/Bowel: Normal appendix. Stomach, large and small bowel grossly unremarkable. Vascular/Lymphatic: No evidence of aneurysm or adenopathy. Reproductive: 7 cm mass in the right adnexa. It is difficult to determine if this is an exophytic/pedunculated fibroid or a right ovarian hemorrhagic cyst. 3.2 cm cyst in the left ovary. Other: No free fluid or free air. Musculoskeletal: No acute bony abnormality. IMPRESSION: 4 mm distal right ureteral stone with moderate right hydronephrosis and hydroureter. Left nephrolithiasis. 7 cm mass in the right adnexal region. This could reflect a pedunculated fibroid or right ovarian lesion, possibly hemorrhagic cyst. This could be further evaluated with pelvic ultrasound. Electronically Signed   By: Charlett Nose M.D.   On: 06/11/2023 20:33  Labs: Basic Metabolic Panel: Recent Labs  Lab 06/11/23 1746 06/12/23 0527 06/13/23 0256  NA 138 137 138  K 3.6 3.4* 4.2  CL 106 108 104  CO2 22 21* 22  GLUCOSE 114* 119* 123*  BUN 9 11 8    CREATININE 0.72 0.64 0.63  CALCIUM 9.1 7.5* 8.5*   CBC: Recent Labs  Lab 06/11/23 1746 06/12/23 0527 06/13/23 0256  WBC 12.3* 13.9* 15.4*  HGB 13.2 12.1 12.3  HCT 39.2 36.8 37.9  MCV 83.4 86.0 85.9  PLT 403* 331 425*   Microbiology: Results for orders placed or performed during the hospital encounter of 07/04/19  SARS CORONAVIRUS 2 (TAT 6-24 HRS) Nasopharyngeal Nasopharyngeal Swab     Status: None   Collection Time: 07/04/19  6:50 AM   Specimen: Nasopharyngeal Swab  Result Value Ref Range Status   SARS Coronavirus 2 NEGATIVE NEGATIVE Final    Comment: (NOTE) SARS-CoV-2 target nucleic acids are NOT DETECTED. The SARS-CoV-2 RNA is generally detectable in upper and lower respiratory specimens during the acute phase of infection. Negative results do not preclude SARS-CoV-2 infection, do not rule out co-infections with other pathogens, and should not be used as the sole basis for treatment or other patient management decisions. Negative results must be combined with clinical observations, patient history, and epidemiological information. The expected result is Negative. Fact Sheet for Patients: HairSlick.no Fact Sheet for Healthcare Providers: quierodirigir.com This test is not yet approved or cleared by the [place] FDA and  has been authorized for detection and/or diagnosis of SARS-CoV-2 by FDA under an Emergency Use Authorization (EUA). This EUA will remain  in effect (meaning this test can be used) for the duration of the COVID-19 declaration under Section 56 4(b)(1) of the Act, 21 U.S.C. section 360bbb-3(b)(1), unless the authorization is terminated or revoked sooner. Performed at [facility], [address], [place], [place] [address]     Time coordinating discharge: Over 30 minutes  Leeroy Bock, MD  [facility] 06/13/2023, 1:55 PM

## 2023-06-13 NOTE — Plan of Care (Signed)
 Pt was able to rest during the night. Rise and fall of chest noticeable. Call light within reach. Husband was at bedside. Pt stated she looks forward to being discharged if all is well.    Problem: Health Behavior/Discharge Planning: Goal: Ability to manage health-related needs will improve Outcome: Progressing   Problem: Clinical Measurements: Goal: Ability to maintain clinical measurements within normal limits will improve Outcome: Progressing Goal: Will remain free from infection Outcome: Progressing Goal: Diagnostic test results will improve Outcome: Progressing   Problem: Safety: Goal: Ability to remain free from injury will improve Outcome: Progressing

## 2023-06-14 ENCOUNTER — Emergency Department (HOSPITAL_COMMUNITY): Payer: BC Managed Care – PPO

## 2023-06-14 ENCOUNTER — Inpatient Hospital Stay (HOSPITAL_COMMUNITY)
Admission: EM | Admit: 2023-06-14 | Discharge: 2023-06-16 | DRG: 862 | Disposition: A | Discharge status: Home / Self Care | Payer: BC Managed Care – PPO | Attending: Family Medicine | Admitting: Family Medicine

## 2023-06-14 ENCOUNTER — Encounter (HOSPITAL_COMMUNITY): Payer: Self-pay | Admitting: Emergency Medicine

## 2023-06-14 ENCOUNTER — Other Ambulatory Visit: Payer: Self-pay

## 2023-06-14 ENCOUNTER — Telehealth: Payer: Self-pay | Admitting: Urology

## 2023-06-14 DIAGNOSIS — A419 Sepsis, unspecified organism: Secondary | ICD-10-CM | POA: Diagnosis present

## 2023-06-14 DIAGNOSIS — N10 Acute tubulo-interstitial nephritis: Secondary | ICD-10-CM | POA: Diagnosis present

## 2023-06-14 DIAGNOSIS — N83201 Unspecified ovarian cyst, right side: Secondary | ICD-10-CM | POA: Diagnosis present

## 2023-06-14 DIAGNOSIS — Z87440 Personal history of urinary (tract) infections: Secondary | ICD-10-CM | POA: Diagnosis not present

## 2023-06-14 DIAGNOSIS — K219 Gastro-esophageal reflux disease without esophagitis: Secondary | ICD-10-CM | POA: Diagnosis present

## 2023-06-14 DIAGNOSIS — N12 Tubulo-interstitial nephritis, not specified as acute or chronic: Secondary | ICD-10-CM

## 2023-06-14 DIAGNOSIS — Z8249 Family history of ischemic heart disease and other diseases of the circulatory system: Secondary | ICD-10-CM | POA: Diagnosis not present

## 2023-06-14 DIAGNOSIS — K5900 Constipation, unspecified: Secondary | ICD-10-CM | POA: Diagnosis present

## 2023-06-14 DIAGNOSIS — Z87442 Personal history of urinary calculi: Secondary | ICD-10-CM

## 2023-06-14 DIAGNOSIS — I10 Essential (primary) hypertension: Secondary | ICD-10-CM | POA: Diagnosis present

## 2023-06-14 DIAGNOSIS — Z885 Allergy status to narcotic agent status: Secondary | ICD-10-CM | POA: Diagnosis not present

## 2023-06-14 DIAGNOSIS — N200 Calculus of kidney: Secondary | ICD-10-CM | POA: Diagnosis present

## 2023-06-14 DIAGNOSIS — Z6831 Body mass index (BMI) 31.0-31.9, adult: Secondary | ICD-10-CM | POA: Diagnosis not present

## 2023-06-14 DIAGNOSIS — F32A Depression, unspecified: Secondary | ICD-10-CM | POA: Diagnosis present

## 2023-06-14 DIAGNOSIS — E66811 Obesity, class 1: Secondary | ICD-10-CM | POA: Diagnosis present

## 2023-06-14 DIAGNOSIS — Z841 Family history of disorders of kidney and ureter: Secondary | ICD-10-CM

## 2023-06-14 DIAGNOSIS — Z79899 Other long term (current) drug therapy: Secondary | ICD-10-CM

## 2023-06-14 DIAGNOSIS — Z8349 Family history of other endocrine, nutritional and metabolic diseases: Secondary | ICD-10-CM | POA: Diagnosis not present

## 2023-06-14 DIAGNOSIS — Z807 Family history of other malignant neoplasms of lymphoid, hematopoietic and related tissues: Secondary | ICD-10-CM | POA: Diagnosis not present

## 2023-06-14 DIAGNOSIS — T8144XA Sepsis following a procedure, initial encounter: Principal | ICD-10-CM | POA: Diagnosis present

## 2023-06-14 DIAGNOSIS — F17210 Nicotine dependence, cigarettes, uncomplicated: Secondary | ICD-10-CM | POA: Diagnosis present

## 2023-06-14 DIAGNOSIS — E876 Hypokalemia: Secondary | ICD-10-CM | POA: Diagnosis present

## 2023-06-14 DIAGNOSIS — R5082 Postprocedural fever: Secondary | ICD-10-CM | POA: Diagnosis present

## 2023-06-14 LAB — CBC WITH DIFFERENTIAL/PLATELET
Abs Immature Granulocytes: 0.04 10*3/uL (ref 0.00–0.07)
Basophils Absolute: 0 10*3/uL (ref 0.0–0.1)
Basophils Relative: 0 %
Eosinophils Absolute: 0 10*3/uL (ref 0.0–0.5)
Eosinophils Relative: 0 %
HCT: 40.3 % (ref 36.0–46.0)
Hemoglobin: 13.2 g/dL (ref 12.0–15.0)
Immature Granulocytes: 0 %
Lymphocytes Relative: 14 %
Lymphs Abs: 1.6 10*3/uL (ref 0.7–4.0)
MCH: 27.8 pg (ref 26.0–34.0)
MCHC: 32.8 g/dL (ref 30.0–36.0)
MCV: 84.8 fL (ref 80.0–100.0)
Monocytes Absolute: 0.8 10*3/uL (ref 0.1–1.0)
Monocytes Relative: 7 %
Neutro Abs: 9.3 10*3/uL — ABNORMAL HIGH (ref 1.7–7.7)
Neutrophils Relative %: 79 %
Platelets: 368 10*3/uL (ref 150–400)
RBC: 4.75 MIL/uL (ref 3.87–5.11)
RDW: 15.5 % (ref 11.5–15.5)
WBC: 11.8 10*3/uL — ABNORMAL HIGH (ref 4.0–10.5)
nRBC: 0 % (ref 0.0–0.2)

## 2023-06-14 LAB — URINALYSIS, W/ REFLEX TO CULTURE (INFECTION SUSPECTED)
Bilirubin Urine: NEGATIVE
Glucose, UA: NEGATIVE mg/dL
Ketones, ur: NEGATIVE mg/dL
Nitrite: NEGATIVE
Protein, ur: 100 mg/dL — AB
RBC / HPF: >50 RBC/hpf (ref 0–5)
Specific Gravity, Urine: 1.015 (ref 1.005–1.030)
pH: 6 (ref 5.0–8.0)

## 2023-06-14 LAB — RESP PANEL BY RT-PCR (RSV, FLU A&B, COVID)  RVPGX2
Influenza A by PCR: NEGATIVE
Influenza B by PCR: NEGATIVE
Resp Syncytial Virus by PCR: NEGATIVE
SARS Coronavirus 2 by RT PCR: NEGATIVE

## 2023-06-14 LAB — COMPREHENSIVE METABOLIC PANEL
ALT: 21 U/L (ref 0–44)
AST: 23 U/L (ref 15–41)
Albumin: 3.6 g/dL (ref 3.5–5.0)
Alkaline Phosphatase: 95 U/L (ref 38–126)
Anion gap: 13 (ref 5–15)
BUN: 10 mg/dL (ref 6–20)
CO2: 22 mmol/L (ref 22–32)
Calcium: 7.9 mg/dL — ABNORMAL LOW (ref 8.9–10.3)
Chloride: 102 mmol/L (ref 98–111)
Creatinine, Ser: 0.84 mg/dL (ref 0.44–1.00)
GFR, Estimated: >60 mL/min (ref >60)
Glucose, Bld: 105 mg/dL — ABNORMAL HIGH (ref 70–99)
Potassium: 3.2 mmol/L — ABNORMAL LOW (ref 3.5–5.1)
Sodium: 137 mmol/L (ref 135–145)
Total Bilirubin: 0.4 mg/dL (ref 0.0–1.2)
Total Protein: 7.3 g/dL (ref 6.5–8.1)

## 2023-06-14 LAB — LACTIC ACID, PLASMA
Lactic Acid, Venous: 2 mmol/L (ref 0.5–1.9)
Lactic Acid, Venous: 0.7 mmol/L (ref 0.5–1.9)

## 2023-06-14 LAB — PREGNANCY, URINE: Preg Test, Ur: NEGATIVE

## 2023-06-14 MED ORDER — POTASSIUM CHLORIDE IN NACL 40-0.9 MEQ/L-% IV SOLN
INTRAVENOUS | Status: AC
  Filled 2023-06-14: qty 1000

## 2023-06-14 MED ORDER — IBUPROFEN 400 MG PO TABS
600.0000 mg | ORAL_TABLET | Freq: Once | ORAL | Status: AC
  Administered 2023-06-14: 600 mg via ORAL
  Filled 2023-06-14: qty 2

## 2023-06-14 MED ORDER — ACETAMINOPHEN 650 MG RE SUPP: 650.0000 mg | Freq: Four times a day (QID) | RECTAL | Status: DC | PRN

## 2023-06-14 MED ORDER — AMLODIPINE BESYLATE 5 MG PO TABS
10.0000 mg | ORAL_TABLET | Freq: Every day | ORAL | Status: DC
  Administered 2023-06-14 – 2023-06-16 (×3): 10 mg via ORAL
  Filled 2023-06-14 (×3): qty 2

## 2023-06-14 MED ORDER — IBUPROFEN 600 MG PO TABS
600.0000 mg | ORAL_TABLET | Freq: Three times a day (TID) | ORAL | Status: DC | PRN
  Administered 2023-06-15: 600 mg via ORAL
  Filled 2023-06-14: qty 1

## 2023-06-14 MED ORDER — ACETAMINOPHEN 500 MG PO TABS
1000.0000 mg | ORAL_TABLET | Freq: Once | ORAL | Status: AC
  Administered 2023-06-14: 1000 mg via ORAL
  Filled 2023-06-14: qty 2

## 2023-06-14 MED ORDER — IOHEXOL 300 MG/ML  SOLN
100.0000 mL | Freq: Once | INTRAMUSCULAR | Status: AC | PRN
Start: 2023-06-14 — End: 2023-06-14
  Administered 2023-06-14: 100 mL via INTRAVENOUS

## 2023-06-14 MED ORDER — HYDROMORPHONE HCL 1 MG/ML IJ SOLN
0.5000 mg | Freq: Once | INTRAMUSCULAR | Status: AC
  Administered 2023-06-14: 0.5 mg via INTRAVENOUS
  Filled 2023-06-14 (×2): qty 0.5

## 2023-06-14 MED ORDER — ONDANSETRON HCL 4 MG/2ML IJ SOLN
4.0000 mg | Freq: Once | INTRAMUSCULAR | Status: AC
  Administered 2023-06-14: 4 mg via INTRAVENOUS
  Filled 2023-06-14: qty 2

## 2023-06-14 MED ORDER — SODIUM CHLORIDE 0.9 % IV SOLN
2.0000 g | INTRAVENOUS | Status: DC
  Administered 2023-06-15: 2 g via INTRAVENOUS
  Filled 2023-06-14: qty 20

## 2023-06-14 MED ORDER — SODIUM CHLORIDE 0.9 % IV BOLUS
1000.0000 mL | Freq: Once | INTRAVENOUS | Status: AC
  Administered 2023-06-14: 1000 mL via INTRAVENOUS

## 2023-06-14 MED ORDER — NORETHINDRONE 0.35 MG PO TABS: 1.0000 | ORAL_TABLET | Freq: Every day | ORAL | Status: DC

## 2023-06-14 MED ORDER — ACETAMINOPHEN 325 MG PO TABS
650.0000 mg | ORAL_TABLET | Freq: Four times a day (QID) | ORAL | Status: DC | PRN
  Administered 2023-06-15 (×2): 650 mg via ORAL
  Filled 2023-06-14 (×2): qty 2

## 2023-06-14 MED ORDER — METRONIDAZOLE 500 MG/100ML IV SOLN
500.0000 mg | Freq: Once | INTRAVENOUS | Status: AC
  Administered 2023-06-14: 500 mg via INTRAVENOUS
  Filled 2023-06-14: qty 100

## 2023-06-14 MED ORDER — POLYETHYLENE GLYCOL 3350 17 G PO PACK: 17.0000 g | PACK | Freq: Every day | ORAL | Status: DC | PRN

## 2023-06-14 MED ORDER — ONDANSETRON HCL 4 MG/2ML IJ SOLN
4.0000 mg | Freq: Four times a day (QID) | INTRAMUSCULAR | Status: DC | PRN
  Administered 2023-06-15 (×2): 4 mg via INTRAVENOUS
  Filled 2023-06-14 (×2): qty 2

## 2023-06-14 MED ORDER — SODIUM CHLORIDE 0.9 % IV SOLN
2.0000 g | Freq: Once | INTRAVENOUS | Status: AC
  Administered 2023-06-14: 2 g via INTRAVENOUS
  Filled 2023-06-14: qty 20

## 2023-06-14 MED ORDER — POTASSIUM CHLORIDE CRYS ER 20 MEQ PO TBCR
40.0000 meq | EXTENDED_RELEASE_TABLET | Freq: Once | ORAL | Status: AC
  Administered 2023-06-14: 40 meq via ORAL
  Filled 2023-06-14: qty 2

## 2023-06-14 MED ORDER — ONDANSETRON HCL 4 MG PO TABS: 4.0000 mg | ORAL_TABLET | Freq: Four times a day (QID) | ORAL | Status: DC | PRN

## 2023-06-14 NOTE — Telephone Encounter (Signed)
 Had stent placed Monday and she doesn't know when to remove it or if she needs an appt to remove. She also has a fever of 102.6 and is concerned.

## 2023-06-14 NOTE — Assessment & Plan Note (Signed)
Stable. -Resume Norvasc.

## 2023-06-14 NOTE — Sepsis Progress Note (Signed)
 Code Sepsis protocol being monitored by eLink.

## 2023-06-14 NOTE — Assessment & Plan Note (Addendum)
 Meeting sepsis criteria with fever of 100.7, tachycardia heart rate 92-120, leukocytosis 11.2..  Lactic acid 2 > 2.7.  Blood pressure stable.  Secondary to pyelonephritis considering CT findings, UA with small leukocytes.  Chest x-ray without acute abnormality. -1 L bolus given, continue N/s + 40 kcl 100c/hr x 10hrs -IV Ceftriaxone 2 g daily -Follow-up blood cultures and urine cultures

## 2023-06-14 NOTE — Assessment & Plan Note (Addendum)
 Presenting with sepsis.  Recent stent placement by Dr. Marin Roberts.  CT showing perinephric and periureteric stranding, patchy hypoenhancement of right kidney cortex suspicious for ascending UTI and pyelonephritis.  No recent urine cultures. -IV ceftriaxone 2 g daily -Follow-up cultures blood and urine

## 2023-06-14 NOTE — H&P (Signed)
 History and Physical    Bianca [name] ZOX:[id number] DOB: 13-Jun-1985 DOA: 06/14/2023  PCP: Elmer Picker [facility]   Patient coming from: Home  I have personally briefly reviewed patient's old medical records in [facility]  Chief Complaint: Fever, abdominal pain  HPI: Bianca [name] is a 38 y.o. female with medical history significant for nephrolithiasis, hypertension, depression. Patient presented to the ED with complaints of generalized weakness, lower abdominal pain, and fever of up to 102.6, with passage of small blood clots in her urine. Recently hospitalized 2/16 and was just discharged yesterday 2/18 for right obstructing ureteral stone, and had stent placed by Dr. Mena Goes.   She felt better until last night when her symptoms started.  She reports nausea without vomiting.  No respiratory symptoms.  ED Course: Tmax of 100.7.  Tachycardic heart rate 92-120.  Respiratory rate 14-18.  Blood pressure systolic 133-158.  O2 sats greater than 97% on room air. WBC 11.8.  Potassium 3.2. Lactic acid 2 > 0.7. CT abdomen and pelvis with contrast-interim placement of right sided ureteral stent with decreased hydronephrosis.  Mild urothelial enhancement on the right with perinephric and periureteric stranding, patchy hypoenhancement involving the cortex of the right kidney suspicious for ascending urinary tract infection and pyelonephritis. No abscess. 2 g IV ceftriaxone given, 1 L bolus given. EDP talked to urologist Dr. Ronne Binning, recommended admission, given recent stent placement.  Review of Systems: As per HPI all other systems reviewed and negative.  Past Medical History:  Diagnosis Date   GERD (gastroesophageal reflux disease)    History of kidney stones    History of recurrent UTIs    Hypertension    Kidney stones     Past Surgical History:  Procedure Laterality Date   CYSTOSCOPY WITH STENT PLACEMENT Right 06/14/2019   Procedure: Cystoscopy, Right  Retrograde Pyelogram, Right Stent Placement;  Surgeon: Malen Gauze, MD;  Location: [facility];  Service: Urology;  Laterality: Right;   CYSTOSCOPY/RETROGRADE/URETEROSCOPY/STONE EXTRACTION WITH BASKET Right 06/12/2023   Procedure: CYSTOSCOPY/RETROGRADE/URETEROSCOPY/STONE EXTRACTION WITH BASKET AND STENT PLACEMENT;  Surgeon: Jerilee Field, MD;  Location: [facility];  Service: Urology;  Laterality: Right;   CYSTOSCOPY/URETEROSCOPY/HOLMIUM LASER/STENT PLACEMENT Right 07/08/2019   Procedure: CYSTOSCOPY/RIGHT URETEROSCOPY/RIGHT RETROGRADE PYELOGRAM/STENT EXCHANGE;  Surgeon: Malen Gauze, MD;  Location: [facility];  Service: Urology;  Laterality: Right;   HOLMIUM LASER APPLICATION Right 07/08/2019   Procedure: HOLMIUM LASER LITHOTRIPSY RIGHT URETERAL CALCULUS;  Surgeon: Malen Gauze, MD;  Location: [facility];  Service: Urology;  Laterality: Right;   HOLMIUM LASER APPLICATION Right 06/12/2023   Procedure: HOLMIUM LASER APPLICATION;  Surgeon: Jerilee Field, MD;  Location: [facility];  Service: Urology;  Laterality: Right;   NO PAST SURGERIES       reports that she has been smoking cigarettes. She started smoking about 19 years ago. She has a 5 pack-year smoking history. She has never used smokeless tobacco. She reports that she does not drink alcohol and does not use drugs.  Allergies  Allergen Reactions   Codeine Hives and Shortness Of Breath    Tolerates hydromorphone   Hydrocodone Hives    Family History  Problem Relation Age of Onset   Heart disease Paternal Grandmother    Cancer Paternal Grandmother        breast   Thyroid disease Father    Cancer Father        lung   Hodgkin's lymphoma Father    Sudden Cardiac Death Father  Was not sure if was an MI or not.  Was during cancer treatment.  No autopsy done   Kidney disease Mother        GN3N   Other Maternal Grandmother        Guillian Barre syndrome    Prior to Admission medications   Medication Sig Start Date End Date  Taking? Authorizing Provider  acetaminophen (TYLENOL) 325 MG tablet Take 2 tablets (650 mg total) by mouth every 6 (six) hours as needed for mild pain (pain score 1-3) (or Fever >/= 101). 06/13/23 07/13/23 Yes Leeroy Bock, MD  amLODipine (NORVASC) 10 MG tablet Take 10 mg by mouth daily. 01/05/23  Yes [provider]  Ibuprofen (MOTRIN PO) Take 1 tablet by mouth every 8 (eight) hours as needed (pain).   Yes [provider]  norethindrone (MICRONOR) 0.35 MG tablet Take 1 tablet by mouth daily. 01/26/23  Yes [provider]  ondansetron (ZOFRAN-ODT) 4 MG disintegrating tablet Take 4 mg by mouth every 4 (four) hours as needed for nausea or vomiting. 09/18/20  Yes [provider]  tamsulosin (FLOMAX) 0.4 MG CAPS capsule Take 0.4 mg by mouth daily as needed (renalcolic).   Yes [provider]    Physical Exam: Vitals:   06/14/23 1211 06/14/23 1216 06/14/23 1349 06/14/23 1606  BP: (!) 153/104  (!) 158/105 136/86  Pulse: (!) 120  (!) 101 92  Resp: 18  14 16   Temp: (!) 100.7 F (38.2 C)   98.9 F (37.2 C)  TempSrc: Oral   Oral  SpO2: 97%  100% 99%  Weight:  77.1 kg    Height:  5\' 2"  (1.575 m)      Constitutional: NAD, calm, comfortable Vitals:   06/14/23 1211 06/14/23 1216 06/14/23 1349 06/14/23 1606  BP: (!) 153/104  (!) 158/105 136/86  Pulse: (!) 120  (!) 101 92  Resp: 18  14 16   Temp: (!) 100.7 F (38.2 C)   98.9 F (37.2 C)  TempSrc: Oral   Oral  SpO2: 97%  100% 99%  Weight:  77.1 kg    Height:  5\' 2"  (1.575 m)     Eyes: PERRL, lids and conjunctivae normal ENMT: Mucous membranes are moist. .  Neck: normal, supple, no masses, no thyromegaly Respiratory: clear to auscultation bilaterally, no wheezing, no crackles. Normal respiratory effort. No accessory muscle use.  Cardiovascular: Regular rate and rhythm, no murmurs / rubs / gallops. No extremity edema.  Extremities warm. Abdomen: Mild to moderate suprapubic tenderness, no masses  palpated. No hepatosplenomegaly. Bowel sounds positive.  Musculoskeletal: no clubbing / cyanosis. No joint deformity upper and lower extremities.  Skin: no rashes, lesions, ulcers. No induration Neurologic: No facial symmetry, moving extremities spontaneously, speech fluent Psychiatric: Normal judgment and insight. Alert and oriented x 3. Normal mood.   Labs on Admission: I have personally reviewed following labs and imaging studies  CBC: Recent Labs  Lab 06/11/23 1746 06/12/23 0527 06/13/23 0256 06/14/23 1239  WBC 12.3* 13.9* 15.4* 11.8*  NEUTROABS  --   --   --  9.3*  HGB 13.2 12.1 12.3 13.2  HCT 39.2 36.8 37.9 40.3  MCV 83.4 86.0 85.9 84.8  PLT 403* 331 425* 368   Basic Metabolic Panel: Recent Labs  Lab 06/11/23 1746 06/12/23 0527 06/13/23 0256 06/14/23 1239  NA 138 137 138 137  K 3.6 3.4* 4.2 3.2*  CL 106 108 104 102  CO2 22 21* 22 22  GLUCOSE 114* 119* 123* 105*  BUN 9 11 8 10   CREATININE 0.72 0.64 0.63 0.84  CALCIUM 9.1 7.5* 8.5* 7.9*   GFR: Estimated Creatinine Clearance: 88.2 mL/min (by C-G formula based on SCr of 0.84 mg/dL). Liver Function Tests: Recent Labs  Lab 06/11/23 1746 06/14/23 1239  AST 20 23  ALT 25 21  ALKPHOS 107 95  BILITOT 0.5 0.4  PROT 7.3 7.3  ALBUMIN 3.8 3.6   Urine analysis:    Component Value Date/Time   COLORURINE YELLOW 06/14/2023 1230   APPEARANCEUR HAZY (A) 06/14/2023 1230   LABSPEC 1.015 06/14/2023 1230   PHURINE 6.0 06/14/2023 1230   GLUCOSEU NEGATIVE 06/14/2023 1230   HGBUR LARGE (A) 06/14/2023 1230   BILIRUBINUR NEGATIVE 06/14/2023 1230   BILIRUBINUR neg 07/24/2019 0930   KETONESUR NEGATIVE 06/14/2023 1230   PROTEINUR 100 (A) 06/14/2023 1230   UROBILINOGEN 0.2 07/24/2019 0930   UROBILINOGEN 4.0 (H) 05/26/2014 1803   NITRITE NEGATIVE 06/14/2023 1230   LEUKOCYTESUR SMALL (A) 06/14/2023 1230    Radiological Exams on Admission: CT ABDOMEN PELVIS W CONTRAST Result Date: 06/14/2023 CLINICAL DATA:  Abdomen pain  recent stent placement fever EXAM: CT ABDOMEN AND PELVIS WITH CONTRAST TECHNIQUE: Multidetector CT imaging of the abdomen and pelvis was performed using the standard protocol following bolus administration of intravenous contrast. RADIATION DOSE REDUCTION: This exam was performed according to the departmental dose-optimization program which includes automated exposure control, adjustment of the mA and/or kV according to patient size and/or use of iterative reconstruction technique. CONTRAST:  OMNIPAQUE IOHEXOL 300 MG/ML  SOLN COMPARISON:  CT 06/11/2023, pelvic ultrasound 06/11/2023 FINDINGS: Lower chest: Lung bases demonstrate no acute airspace disease. Hepatobiliary: No focal liver abnormality is seen. No gallstones, gallbladder wall thickening, or biliary dilatation. Pancreas: Unremarkable. No pancreatic ductal dilatation or surrounding inflammatory changes. Spleen: Normal in size without focal abnormality. Adrenals/Urinary Tract: Adrenal glands are normal. Interim placement of right-sided ureteral stent with proximal pigtail within extrarenal pelvis and distal pigtail at the base of the bladder. Decreased hydronephrosis compared to prior with mild residual. Previously noted ureteral stones are not clearly identified. Mild urothelial enhancement on the right involving the renal pelvis and ureter with mild perinephric stranding and Peri ureteral stranding. Patchy hypoenhancement involving the cortex of the right kidney. No abscess. Nonobstructing left kidney stones measuring up to 4 mm in size. Stomach/Bowel: Stomach is within normal limits. Appendix appears normal. No evidence of bowel wall thickening, distention, or inflammatory changes. Vascular/Lymphatic: No significant vascular findings are present. No enlarged abdominal or pelvic lymph nodes. Reproductive: Uterus unremarkable. 3.2 cm left adnexal cyst, no specific imaging follow-up recommended. Complex right adnexal cyst with septation measuring 7 x 5.5  cm, previously 7 cm. Other: No free air.  No significant effusion Musculoskeletal: No acute or significant osseous findings. IMPRESSION: 1. Interim placement of right-sided ureteral stent with decreased hydronephrosis compared to prior. Previously noted ureteral stones are not clearly identified. Mild urothelial enhancement on the right with perinephric and periureteral stranding, patchy hypoenhancement involving the cortex of the right kidney suspicious for ascending urinary tract infection and pyelonephritis. No abscess. 2. Nonobstructing left kidney stones. 3. Complex right adnexal cyst with septation measuring 7 cm, previously 7 cm. This was previously characterized as possible hemorrhagic cyst or endometrioma on ultrasound from 06/11/2023. Reference that report for additional recommendations Electronically Signed   By: Jasmine Pang M.D.   On: 06/14/2023 15:21   DG Chest Port 1 View Result Date: 06/14/2023 CLINICAL DATA:  Febrile. EXAM: PORTABLE CHEST 1 VIEW COMPARISON:  Chest  radiograph dated 05/11/2017. FINDINGS: The heart size and mediastinal contours are within normal limits. No focal consolidation, pleural effusion, or pneumothorax. No acute osseous abnormality. IMPRESSION: No acute cardiopulmonary findings. Electronically Signed   By: Hart Robinsons M.D.   On: 06/14/2023 14:55   EKG: None   Assessment/Plan Principal Problem:   Pyelonephritis Active Problems:   Sepsis (HCC)   Hypokalemia   Essential hypertension  Assessment and Plan: * Pyelonephritis Presenting with sepsis.  Recent stent placement by Dr. Marin Roberts.  CT showing perinephric and periureteric stranding, patchy hypoenhancement of right kidney cortex suspicious for ascending UTI and pyelonephritis.  No recent urine cultures. -IV ceftriaxone 2 g daily -Follow-up cultures blood and urine  Sepsis (HCC) Meeting sepsis criteria with fever of 100.7, tachycardia heart rate 92-120, leukocytosis 11.2..  Lactic acid 2 > 2.7.   Blood pressure stable.  Secondary to pyelonephritis considering CT findings, UA with small leukocytes.  Chest x-ray without acute abnormality. -1 L bolus given, continue N/s + 40 kcl 100c/hr x 10hrs -IV Ceftriaxone 2 g daily -Follow-up blood cultures and urine cultures  Hypokalemia Mild K- 3.2. - Replete  Essential hypertension Stable. -Resume Norvasc   DVT prophylaxis: SCDS for now, with reports of clots in urine Code Status: FULL Family Communication: None at bedside Disposition Plan: ~ 2 days Consults called: None  Admission status:  Inpt Tele I certify that at the point of admission it is my clinical judgment that the patient will require inpatient hospital care spanning beyond 2 midnights from the point of admission due to high intensity of service, high risk for further deterioration and high frequency of surveillance required.   Author: Onnie Boer, MD 06/14/2023 7:29 PM  For on call review [email or web address].

## 2023-06-14 NOTE — ED Triage Notes (Signed)
 Pt reports stent placement in right kidney Monday of this week. Pt felt cold and decided to take her temp. At 9am pt reports a temp of 102.6. She reports taking 1000mg  of tylenol at that time. Pt temp in triage is 100.7. Pt feels and is visibly weak. Pt reports increased pain of 8/10. Tylenol taken this morning was not effective for pain. Pt reports small blood clots in urine. Pt reports painful urination. Pt dr office directed her to the ED for evaluation.

## 2023-06-14 NOTE — Assessment & Plan Note (Signed)
 Mild K- 3.2. - Replete

## 2023-06-14 NOTE — ED Provider Notes (Signed)
   ED Course / MDM   Clinical Course as of 06/14/23 1746  Wed Jun 14, 2023  1415 Pulse Rate(!): 101 improving [SG]  1535 CT w/  ?pyelo, will d/w uro [SG]  1554 Received sign out from Dr. Wallace Cullens presenting with fever, had recent stenting for nephrolithiasis, found to have pyelonephritis, pending urology consult  [WS]  1746 Patient overall quite well-appearing, she feels better.  Discussed with Dr. Ronne Binning, given her history of recent stent placement, he does recommend that we have the patient observed overnight.  Discussed with Dr. Mariea Clonts, and she agrees to admit the patient. [WS]    Clinical Course User Index [SG] Sloan Leiter, DO [WS] Lonell Grandchild, MD   Medical Decision Making Amount and/or Complexity of Data Reviewed Labs: ordered. Radiology: ordered.  Risk OTC drugs. Prescription drug management. Decision regarding hospitalization.          Lonell Grandchild, MD 06/14/23 [phone]

## 2023-06-14 NOTE — Telephone Encounter (Signed)
 Will this be an [facility] Pt ? No f/u on file. Pt was advised to go to the ED

## 2023-06-14 NOTE — ED Provider Notes (Signed)
 [place] EMERGENCY DEPARTMENT AT [facility] Provider Note  CSN: [id number] Arrival date & time: 06/14/23 1206  Chief Complaint(s) Fever (Post-op)  HPI Bianca [name] is a 38 y.o. female with past medical history as below, significant for GERD, nephrolithiasis, hypertension, ovarian cyst, who presents to the ED with complaint of fever, abdominal pain  She underwent cystoscopy with stone extraction and stenting Dr. Mena Goes 2/17, she is feeling well until last night, began having worsening abdominal pain, blood in her urine, fever, Tmax home 102.  Nausea no vomiting.  She called urology office and they recommended she come to the ER for evaluation per pt    Past Medical History Past Medical History:  Diagnosis Date   GERD (gastroesophageal reflux disease)    History of kidney stones    History of recurrent UTIs    Hypertension    Kidney stones    Patient Active Problem List   Diagnosis Date Noted   Cyst of right ovary 06/13/2023   Ureterolithiasis 06/13/2023   Depression 06/12/2023   Acute unilateral obstructive uropathy 06/11/2023   Acute cystitis without hematuria 07/17/2019   Ureteral calculus 06/13/2019   Essential hypertension 07/30/2018   Cellulitis 07/28/2018   Cat bite of finger 07/27/2018 07/28/2018   Heart palpitations 05/26/2017   Chest discomfort 05/26/2017   Dyspareunia, female 09/06/2016   Dysmenorrhea 09/06/2016   Menorrhagia with regular cycle 09/06/2016   Postpartum hypertension, resolved 08/18/2014   NSVD (normal spontaneous vaginal delivery) 05/30/2014   Preeclampsia, severe 05/29/2014   Right nephrolithiasis 03/17/2014   Hydronephrosis determined by ultrasound 03/16/2014   Hydronephrosis of right kidney 03/15/2014   Benign essential hypertension, antepartum 10/22/2013   Smoker 10/22/2013   History of gestational hypertension 10/22/2013   Home Medication(s) Prior to Admission medications   Medication Sig Start Date End Date Taking?  Authorizing Provider  acetaminophen (TYLENOL) 325 MG tablet Take 2 tablets (650 mg total) by mouth every 6 (six) hours as needed for mild pain (pain score 1-3) (or Fever >/= 101). 06/13/23 07/13/23  Leeroy Bock, MD  amLODipine (NORVASC) 10 MG tablet Take 10 mg by mouth daily. 01/05/23   [provider]  Ibuprofen (MOTRIN PO) Take 1 tablet by mouth every 8 (eight) hours as needed (pain).    [provider]  ondansetron (ZOFRAN-ODT) 4 MG disintegrating tablet Take 4 mg by mouth every 4 (four) hours as needed for nausea or vomiting. 09/18/20   [provider]  tamsulosin (FLOMAX) 0.4 MG CAPS capsule Take 0.4 mg by mouth daily as needed (renalcolic).    [provider]                                                                                                                                    Past Surgical History Past Surgical History:  Procedure Laterality Date   CYSTOSCOPY WITH STENT PLACEMENT Right 06/14/2019   Procedure: Cystoscopy, Right Retrograde Pyelogram,  Right Stent Placement;  Surgeon: Malen Gauze, MD;  Location: [facility];  Service: Urology;  Laterality: Right;   CYSTOSCOPY/RETROGRADE/URETEROSCOPY/STONE EXTRACTION WITH BASKET Right 06/12/2023   Procedure: CYSTOSCOPY/RETROGRADE/URETEROSCOPY/STONE EXTRACTION WITH BASKET AND STENT PLACEMENT;  Surgeon: Jerilee Field, MD;  Location: [facility];  Service: Urology;  Laterality: Right;   CYSTOSCOPY/URETEROSCOPY/HOLMIUM LASER/STENT PLACEMENT Right 07/08/2019   Procedure: CYSTOSCOPY/RIGHT URETEROSCOPY/RIGHT RETROGRADE PYELOGRAM/STENT EXCHANGE;  Surgeon: Malen Gauze, MD;  Location: [facility];  Service: Urology;  Laterality: Right;   HOLMIUM LASER APPLICATION Right 07/08/2019   Procedure: HOLMIUM LASER LITHOTRIPSY RIGHT URETERAL CALCULUS;  Surgeon: Malen Gauze, MD;  Location: [facility];  Service: Urology;  Laterality: Right;   HOLMIUM LASER APPLICATION Right 06/12/2023   Procedure: HOLMIUM LASER  APPLICATION;  Surgeon: Jerilee Field, MD;  Location: [facility];  Service: Urology;  Laterality: Right;   NO PAST SURGERIES     Family History Family History  Problem Relation Age of Onset   Heart disease Paternal Grandmother    Cancer Paternal Grandmother        breast   Thyroid disease Father    Cancer Father        lung   Hodgkin's lymphoma Father    Sudden Cardiac Death Father        Was not sure if was an MI or not.  Was during cancer treatment.  No autopsy done   Kidney disease Mother        GN3N   Other Maternal Grandmother        Guillian Barre syndrome    Social History Social History   Tobacco Use   Smoking status: Every Day    Current packs/day: 0.00    Average packs/day: 0.5 packs/day for 10.0 years (5.0 ttl pk-yrs)    Types: Cigarettes    Start date: 03/25/2004    Last attempt to quit: 03/25/2014    Years since quitting: 9.2   Smokeless tobacco: Never  Vaping Use   Vaping status: Never Used  Substance Use Topics   Alcohol use: No   Drug use: No   Allergies Codeine  Review of Systems A thorough review of systems was obtained and all systems are negative except as noted in the HPI and PMH.   Physical Exam Vital Signs  I have reviewed the triage vital signs BP (!) 153/104 (BP Location: Right Arm)   Pulse (!) 120   Temp (!) 100.7 F (38.2 C) (Oral)   Resp 18   Ht 5\' 2"  (1.575 m)   Wt 77.1 kg   LMP 06/02/2023 (Exact Date)   SpO2 97%   BMI 31.09 kg/m  Physical Exam Vitals and nursing note reviewed.  Constitutional:      General: She is not in acute distress.    Appearance: Normal appearance.  HENT:     Head: Normocephalic and atraumatic.     Right Ear: External ear normal.     Left Ear: External ear normal.     Nose: Nose normal.     Mouth/Throat:     Mouth: Mucous membranes are moist.  Eyes:     General: No scleral icterus.       Right eye: No discharge.        Left eye: No discharge.  Cardiovascular:     Rate and Rhythm: Regular  rhythm. Tachycardia present.     Pulses: Normal pulses.     Heart sounds: Normal heart sounds.  Pulmonary:     Effort: Pulmonary effort is normal. No  respiratory distress.     Breath sounds: Normal breath sounds. No stridor.  Abdominal:     General: Abdomen is flat. There is no distension.     Palpations: Abdomen is soft.     Tenderness: There is abdominal tenderness.       Comments: Mild ttp, not peritoneal  Musculoskeletal:     Cervical back: No rigidity.     Right lower leg: No edema.     Left lower leg: No edema.  Skin:    General: Skin is warm and dry.     Capillary Refill: Capillary refill takes less than 2 seconds.  Neurological:     Mental Status: She is alert.  Psychiatric:        Mood and Affect: Mood normal.        Behavior: Behavior normal. Behavior is cooperative.     ED Results and Treatments Labs (all labs ordered are listed, but only abnormal results are displayed) Labs Reviewed  CBC WITH DIFFERENTIAL/PLATELET - Abnormal; Notable for the following components:      Result Value   WBC 11.8 (*)    Neutro Abs 9.3 (*)    All other components within normal limits  RESP PANEL BY RT-PCR (RSV, FLU A&B, COVID)  RVPGX2  LACTIC ACID, PLASMA  LACTIC ACID, PLASMA  COMPREHENSIVE METABOLIC PANEL  URINALYSIS, W/ REFLEX TO CULTURE (INFECTION SUSPECTED)  POC URINE PREG, ED                                                                                                                          Radiology No results found.  Pertinent labs & imaging results that were available during my care of the patient were reviewed by me and considered in my medical decision making (see MDM for details).  Medications Ordered in ED Medications  sodium chloride 0.9 % bolus 1,000 mL (1,000 mLs Intravenous New Bag/Given 06/14/23 1304)                                                                                                                                     Procedures .Critical  Care  Performed by: Sloan Leiter, DO Authorized by: Sloan Leiter, DO   Critical care provider statement:    Critical care time (minutes):  31   Critical care time was exclusive of:  Separately billable procedures and treating other patients  Critical care was necessary to treat or prevent imminent or life-threatening deterioration of the following conditions:  Sepsis   Critical care was time spent personally by me on the following activities:  Ordering and performing treatments and interventions, ordering and review of laboratory studies, ordering and review of radiographic studies, pulse oximetry, re-evaluation of patient's condition, review of old charts, obtaining history from patient or surrogate, examination of patient, evaluation of patient's response to treatment and development of treatment plan with patient or surrogate   (including critical care time)  Medical Decision Making / ED Course    Medical Decision Making:    Abygail Catlyn Shipton is a 38 y.o. female with past medical history as below, significant for GERD, nephrolithiasis, hypertension, ovarian cyst, who presents to the ED with complaint of fever, abdominal pain. The complaint involves an extensive differential diagnosis and also carries with it a high risk of complications and morbidity.  Serious etiology was considered. Ddx includes but is not limited to: Differential diagnosis for adult fever includes but is not exclusive to community-acquired pneumonia, urinary tract infection, acute cholecystitis, viral syndrome, cellulitis, tick bourne disease,  decubitus ulcer, necrotizing fasciitis, meningitis, encephalitis, influenza, etc.   Complete initial physical exam performed, notably the patient was in no acute distress, she was tachycardic.Marland Kitchen    Reviewed and confirmed nursing documentation for past medical history, family history, social history.  Vital signs reviewed.    Clinical Course as of 06/14/23 2146  Wed Jun 14, 2023  1415 Pulse Rate(!): 101 improving [SG]  1535 CT w/  ?pyelo, will d/w uro [SG]  1554 Received sign out from Dr. Wallace Cullens presenting with fever, had recent stenting for nephrolithiasis, found to have pyelonephritis, pending urology consult  [WS]  1746 Patient overall quite well-appearing, she feels better.  Discussed with Dr. Ronne Binning, given her history of recent stent placement, he does recommend that we have the patient observed overnight.  Discussed with Dr. Mariea Clonts, and she agrees to admit the patient. [WS]    Clinical Course User Index [SG] Sloan Leiter, DO [WS] Lonell Grandchild, MD    Brief summary: 38 yo female Recent uro procedure Fever starting last night, worsening abd pain, nausea, hematuria  Labs reviewed Pt meets for sepsis Presumed urinary/gu source identified, call code sepsis Start abx  Ct w/ ?pyelo  Will consult uro Will admit  Handoff to next EDP Dr Suezanne Jacquet pending uro consult and admit                Additional history obtained: -Additional history obtained from family -External records from outside source obtained and reviewed including: Chart review including previous notes, labs, imaging, consultation notes including  Prior surgery Home meds Prior uro note   Lab Tests: -I ordered, reviewed, and interpreted labs.   The pertinent results include:   Labs Reviewed  CBC WITH DIFFERENTIAL/PLATELET - Abnormal; Notable for the following components:      Result Value   WBC 11.8 (*)    Neutro Abs 9.3 (*)    All other components within normal limits  RESP PANEL BY RT-PCR (RSV, FLU A&B, COVID)  RVPGX2  LACTIC ACID, PLASMA  LACTIC ACID, PLASMA  COMPREHENSIVE METABOLIC PANEL  URINALYSIS, W/ REFLEX TO CULTURE (INFECTION SUSPECTED)  POC URINE PREG, ED    Notable for wbc +  EKG   EKG Interpretation Date/Time:    Ventricular Rate:    PR Interval:    QRS Duration:    QT Interval:    QTC  Calculation:   R Axis:      Text  Interpretation:           Imaging Studies ordered: I ordered imaging studies including CTAP I independently visualized the following imaging with scope of interpretation limited to determining acute life threatening conditions related to emergency care; findings noted above I independently visualized and interpreted imaging. I agree with the radiologist interpretation   Medicines ordered and prescription drug management: Meds ordered this encounter  Medications   sodium chloride 0.9 % bolus 1,000 mL    -I have reviewed the patients home medicines and have made adjustments as needed   Consultations Obtained: I requested consultation with the UROlogist,  awaiting cb   Cardiac Monitoring: The patient was maintained on a cardiac monitor.  I personally viewed and interpreted the cardiac monitored which showed an underlying rhythm of: nsr Continuous pulse oximetry interpreted by myself, 99% on RA.    Social Determinants of Health:  Diagnosis or treatment significantly limited by social determinants of health: obesity   Reevaluation: After the interventions noted above, I reevaluated the patient and found that they have improved  Co morbidities that complicate the patient evaluation  Past Medical History:  Diagnosis Date   GERD (gastroesophageal reflux disease)    History of kidney stones    History of recurrent UTIs    Hypertension    Kidney stones       Dispostion: Disposition decision including need for hospitalization was considered, and patient disposition pending at time of sign out.    Final Clinical Impression(s) / ED Diagnoses Final diagnoses:  Pyelonephritis  Sepsis, due to unspecified organism, unspecified whether acute organ dysfunction present [facility])        Sloan Leiter, DO 06/14/23 2148

## 2023-06-14 NOTE — Telephone Encounter (Signed)
 Due to last telephone encounter Pt was advised to go to ED

## 2023-06-15 DIAGNOSIS — N12 Tubulo-interstitial nephritis, not specified as acute or chronic: Secondary | ICD-10-CM | POA: Diagnosis not present

## 2023-06-15 LAB — CBC
HCT: 34.2 % — ABNORMAL LOW (ref 36.0–46.0)
Hemoglobin: 10.9 g/dL — ABNORMAL LOW (ref 12.0–15.0)
MCH: 27.5 pg (ref 26.0–34.0)
MCHC: 31.9 g/dL (ref 30.0–36.0)
MCV: 86.1 fL (ref 80.0–100.0)
Platelets: 265 10*3/uL (ref 150–400)
RBC: 3.97 MIL/uL (ref 3.87–5.11)
RDW: 15.4 % (ref 11.5–15.5)
WBC: 12.6 10*3/uL — ABNORMAL HIGH (ref 4.0–10.5)
nRBC: 0 % (ref 0.0–0.2)

## 2023-06-15 LAB — BASIC METABOLIC PANEL
Anion gap: 9 (ref 5–15)
BUN: 8 mg/dL (ref 6–20)
CO2: 22 mmol/L (ref 22–32)
Calcium: 7.5 mg/dL — ABNORMAL LOW (ref 8.9–10.3)
Chloride: 107 mmol/L (ref 98–111)
Creatinine, Ser: 0.66 mg/dL (ref 0.44–1.00)
GFR, Estimated: >60 mL/min (ref >60)
Glucose, Bld: 108 mg/dL — ABNORMAL HIGH (ref 70–99)
Potassium: 3.5 mmol/L (ref 3.5–5.1)
Sodium: 138 mmol/L (ref 135–145)

## 2023-06-15 LAB — URINE CULTURE: Culture: NO GROWTH

## 2023-06-15 MED ORDER — POTASSIUM CHLORIDE CRYS ER 20 MEQ PO TBCR
40.0000 meq | EXTENDED_RELEASE_TABLET | Freq: Once | ORAL | Status: AC
  Administered 2023-06-15: 40 meq via ORAL
  Filled 2023-06-15: qty 4

## 2023-06-15 MED ORDER — TAMSULOSIN HCL 0.4 MG PO CAPS
0.4000 mg | ORAL_CAPSULE | Freq: Every day | ORAL | Status: DC
  Administered 2023-06-15: 0.4 mg via ORAL
  Filled 2023-06-15: qty 1

## 2023-06-15 MED ORDER — SENNOSIDES-DOCUSATE SODIUM 8.6-50 MG PO TABS
2.0000 | ORAL_TABLET | Freq: Every day | ORAL | Status: DC
  Administered 2023-06-15: 2 via ORAL
  Filled 2023-06-15: qty 2

## 2023-06-15 MED ORDER — POLYETHYLENE GLYCOL 3350 17 G PO PACK
17.0000 g | PACK | Freq: Every day | ORAL | Status: DC
  Filled 2023-06-15 (×2): qty 1

## 2023-06-15 NOTE — Progress Notes (Signed)
   06/15/23 0953  TOC Brief Assessment  Insurance and Status Reviewed  Patient has primary care physician Yes  Home environment has been reviewed from home  Prior level of function: independent  Prior/Current Home Services No current home services  Social Drivers of Health Review SDOH reviewed no interventions necessary  Readmission risk has been reviewed Yes  Transition of care needs no transition of care needs at this time    Reviewed pt's record. No immediate TOC needs identified. Will assist if needs arise.

## 2023-06-15 NOTE — Progress Notes (Signed)
 PROGRESS NOTE  Bianca [name], is a 38 y.o. female, DOB - 08-03-1985, ZOX:[id number]  Admit date - 06/14/2023   Admitting Physician Ejiroghene Wendall Stade, MD  Outpatient Primary MD for the patient is [facility]  LOS - 1  Chief Complaint  Patient presents with   Fever    Post-op      Brief Summary:-  38 y.o. female with medical history significant for nephrolithiasis, hypertension, depression readmitted on 06/14/2023 with concerns for pyelonephritis after Cystoscopy with right retrograde pyelogram, right ureteroscopy laser lithotripsy and right ureteral stent placement  on 06/12/23    -Assessment and Plan: 1)Sepsis secondary to acute right-sided pyelonephritis--POA - Recent stent placement by Dr. Eskridge-06/12/23-please see brief summary section above - CT showing perinephric and periureteric stranding, patchy hypoenhancement of right kidney cortex suspicious for ascending UTI and pyelonephritis.   -c/n IV ceftriaxone 2 g daily pending further blood and urine culture data -Reports nausea but no further emesis -No BMs -Chills but no further fevers -Suprapubic and lower abdominal discomfort persist, no further Rt flank pain WBC 15.4 >>11.8 >>12.6 -Patient with recurrent nephrolithiasis, continue Flomax to help passage of stones  2)Hypokalemia--normalized after replacement  3)Essential hypertension Stable. -Continue amlodipine -IV hydralazine as needed elevated BP  4) constipation--laxatives as ordered  5)Class 1Obesity- -Low calorie diet, portion control and increase physical activity discussed with patient -Body mass index is 31.09 kg/m.   Status is: Inpatient   Disposition: The patient is from: Home              Anticipated d/c is to: Home              Anticipated d/c date is: 1 day              Patient currently is not medically stable to d/c. Barriers: Not Clinically Stable-   Code Status :  -  Code Status: Full Code   Family Communication:      (patient is alert, awake and coherent)  Discussed with husband at bedside  DVT Prophylaxis  :   - SCDs   SCDs Start: 06/14/23 1956   Lab Results  Component Value Date   PLT 265 06/15/2023   Inpatient Medications  Scheduled Meds:  amLODipine  10 mg Oral Daily   potassium chloride  40 mEq Oral Once   tamsulosin  0.4 mg Oral QPC supper   Continuous Infusions:  cefTRIAXone (ROCEPHIN)  IV     PRN Meds:.acetaminophen **OR** acetaminophen, ibuprofen, ondansetron **OR** ondansetron (ZOFRAN) IV, polyethylene glycol   Anti-infectives (From admission, onward)    Start     Dose/Rate Route Frequency Ordered Stop   06/15/23 1400  cefTRIAXone (ROCEPHIN) 2 g in sodium chloride 0.9 % 100 mL IVPB        2 g 200 mL/hr over 30 Minutes Intravenous Every 24 hours 06/14/23 1955     06/14/23 1400  cefTRIAXone (ROCEPHIN) 2 g in sodium chloride 0.9 % 100 mL IVPB        2 g 200 mL/hr over 30 Minutes Intravenous Once 06/14/23 1356 06/14/23 1438   06/14/23 1400  metroNIDAZOLE (FLAGYL) IVPB 500 mg        500 mg 100 mL/hr over 60 Minutes Intravenous  Once 06/14/23 1356 06/14/23 1551      Subjective: Bianca [name] today has  No chest pain,    Husband at bedside, questions answered -Reports nausea but no further emesis -No BMs -Chills but no further fevers -Suprapubic and lower abdominal discomfort persist,  no further Rt flank pain  Objective: Vitals:   06/15/23 0300 06/15/23 0455 06/15/23 0500 06/15/23 0600  BP: 118/80 126/80 122/83 111/66  Pulse: 95 (!) 104 97 85  Resp: (!) 21 (!) 33 19 (!) 21  Temp: 98.8 F (37.1 C)     TempSrc:      SpO2: 93% 96% 96% 96%  Weight:      Height:       No intake or output data in the 24 hours ending 06/15/23 1035 Filed Weights   06/14/23 1216  Weight: 77.1 kg    Physical Exam Gen:- Awake Alert,  in no apparent distress  HEENT:- [place].AT, No sclera icterus Neck-Supple Neck,No JVD,.  Lungs-  CTAB , fair symmetrical air movement CV- S1, S2 normal,  regular  Abd-  +ve B.Sounds, Abd Soft, suprapubic area discomfort, no CVA area tenderness Extremity/Skin:- No  edema, pedal pulses present  Psych-affect is appropriate, oriented x3 Neuro-no new focal deficits, no tremors  Data Reviewed: I have personally reviewed following labs and imaging studies  CBC: Recent Labs  Lab 06/11/23 1746 06/12/23 0527 06/13/23 0256 06/14/23 1239 06/15/23 0431  WBC 12.3* 13.9* 15.4* 11.8* 12.6*  NEUTROABS  --   --   --  9.3*  --   HGB 13.2 12.1 12.3 13.2 10.9*  HCT 39.2 36.8 37.9 40.3 34.2*  MCV 83.4 86.0 85.9 84.8 86.1  PLT 403* 331 425* 368 265   Basic Metabolic Panel: Recent Labs  Lab 06/11/23 1746 06/12/23 0527 06/13/23 0256 06/14/23 1239 06/15/23 0431  NA 138 137 138 137 138  K 3.6 3.4* 4.2 3.2* 3.5  CL 106 108 104 102 107  CO2 22 21* 22 22 22   GLUCOSE 114* 119* 123* 105* 108*  BUN 9 11 8 10 8   CREATININE 0.72 0.64 0.63 0.84 0.66  CALCIUM 9.1 7.5* 8.5* 7.9* 7.5*   GFR: Estimated Creatinine Clearance: 92.6 mL/min (by C-G formula based on SCr of 0.66 mg/dL). Liver Function Tests: Recent Labs  Lab 06/11/23 1746 06/14/23 1239  AST 20 23  ALT 25 21  ALKPHOS 107 95  BILITOT 0.5 0.4  PROT 7.3 7.3  ALBUMIN 3.8 3.6   Recent Results (from the past 240 hours)  Blood culture (routine x 2)     Status: None (Preliminary result)   Collection Time: 06/14/23 12:56 PM   Specimen: BLOOD  Result Value Ref Range Status   Specimen Description BLOOD LEFT ANTECUBITAL  Final   Special Requests   Final    BOTTLES DRAWN AEROBIC AND ANAEROBIC Blood Culture adequate volume   Culture   Final    NO GROWTH < 24 HOURS Performed at [facility], [address], [place], [place] [address]    Report Status PENDING  Incomplete  Resp panel by RT-PCR (RSV, Flu A&B, Covid) Anterior Nasal Swab     Status: None   Collection Time: 06/14/23  1:55 PM   Specimen: Anterior Nasal Swab  Result Value Ref Range Status   SARS Coronavirus 2 by RT PCR NEGATIVE  NEGATIVE Final    Comment: (NOTE) SARS-CoV-2 target nucleic acids are NOT DETECTED.  The SARS-CoV-2 RNA is generally detectable in upper respiratory specimens during the acute phase of infection. The lowest concentration of SARS-CoV-2 viral copies this assay can detect is 138 copies/mL. A negative result does not preclude SARS-Cov-2 infection and should not be used as the sole basis for treatment or other patient management decisions. A negative result may occur with  improper specimen collection/handling,  submission of specimen other than nasopharyngeal swab, presence of viral mutation(s) within the areas targeted by this assay, and inadequate number of viral copies(<138 copies/mL). A negative result must be combined with clinical observations, patient history, and epidemiological information. The expected result is Negative.  Fact Sheet for Patients:  BloggerCourse.com  Fact Sheet for Healthcare Providers:  SeriousBroker.it  This test is no t yet approved or cleared by the [place] FDA and  has been authorized for detection and/or diagnosis of SARS-CoV-2 by FDA under an Emergency Use Authorization (EUA). This EUA will remain  in effect (meaning this test can be used) for the duration of the COVID-19 declaration under Section 564(b)(1) of the Act, 21 U.S.C.section 360bbb-3(b)(1), unless the authorization is terminated  or revoked sooner.       Influenza A by PCR NEGATIVE NEGATIVE Final   Influenza B by PCR NEGATIVE NEGATIVE Final    Comment: (NOTE) The Xpert Xpress SARS-CoV-2/FLU/RSV plus assay is intended as an aid in the diagnosis of influenza from Nasopharyngeal swab specimens and should not be used as a sole basis for treatment. Nasal washings and aspirates are unacceptable for Xpert Xpress SARS-CoV-2/FLU/RSV testing.  Fact Sheet for Patients: BloggerCourse.com  Fact Sheet for Healthcare  Providers: SeriousBroker.it  This test is not yet approved or cleared by the [place] FDA and has been authorized for detection and/or diagnosis of SARS-CoV-2 by FDA under an Emergency Use Authorization (EUA). This EUA will remain in effect (meaning this test can be used) for the duration of the COVID-19 declaration under Section 564(b)(1) of the Act, 21 U.S.C. section 360bbb-3(b)(1), unless the authorization is terminated or revoked.     Resp Syncytial Virus by PCR NEGATIVE NEGATIVE Final    Comment: (NOTE) Fact Sheet for Patients: BloggerCourse.com  Fact Sheet for Healthcare Providers: SeriousBroker.it  This test is not yet approved or cleared by the [place] FDA and has been authorized for detection and/or diagnosis of SARS-CoV-2 by FDA under an Emergency Use Authorization (EUA). This EUA will remain in effect (meaning this test can be used) for the duration of the COVID-19 declaration under Section 564(b)(1) of the Act, 21 U.S.C. section 360bbb-3(b)(1), unless the authorization is terminated or revoked.  Performed at [facility], [address] [place], [place] [address]   Blood culture (routine x 2)     Status: None (Preliminary result)   Collection Time: 06/14/23  2:13 PM   Specimen: BLOOD  Result Value Ref Range Status   Specimen Description BLOOD RIGHT ANTECUBITAL  Final   Special Requests   Final    BOTTLES DRAWN AEROBIC AND ANAEROBIC Blood Culture adequate volume   Culture   Final    NO GROWTH < 24 HOURS Performed at [facility], [address] [place], [place] [address]    Report Status PENDING  Incomplete     Radiology Studies: CT ABDOMEN PELVIS W CONTRAST Result Date: 06/14/2023 CLINICAL DATA:  Abdomen pain recent stent placement fever EXAM: CT ABDOMEN AND PELVIS WITH CONTRAST TECHNIQUE: Multidetector CT imaging of the abdomen and pelvis was performed using the standard  protocol following bolus administration of intravenous contrast. RADIATION DOSE REDUCTION: This exam was performed according to the departmental dose-optimization program which includes automated exposure control, adjustment of the mA and/or kV according to patient size and/or use of iterative reconstruction technique. CONTRAST:  OMNIPAQUE IOHEXOL 300 MG/ML  SOLN COMPARISON:  CT 06/11/2023, pelvic ultrasound 06/11/2023 FINDINGS: Lower chest: Lung bases demonstrate no acute airspace disease. Hepatobiliary: No focal  liver abnormality is seen. No gallstones, gallbladder wall thickening, or biliary dilatation. Pancreas: Unremarkable. No pancreatic ductal dilatation or surrounding inflammatory changes. Spleen: Normal in size without focal abnormality. Adrenals/Urinary Tract: Adrenal glands are normal. Interim placement of right-sided ureteral stent with proximal pigtail within extrarenal pelvis and distal pigtail at the base of the bladder. Decreased hydronephrosis compared to prior with mild residual. Previously noted ureteral stones are not clearly identified. Mild urothelial enhancement on the right involving the renal pelvis and ureter with mild perinephric stranding and Peri ureteral stranding. Patchy hypoenhancement involving the cortex of the right kidney. No abscess. Nonobstructing left kidney stones measuring up to 4 mm in size. Stomach/Bowel: Stomach is within normal limits. Appendix appears normal. No evidence of bowel wall thickening, distention, or inflammatory changes. Vascular/Lymphatic: No significant vascular findings are present. No enlarged abdominal or pelvic lymph nodes. Reproductive: Uterus unremarkable. 3.2 cm left adnexal cyst, no specific imaging follow-up recommended. Complex right adnexal cyst with septation measuring 7 x 5.5 cm, previously 7 cm. Other: No free air.  No significant effusion Musculoskeletal: No acute or significant osseous findings. IMPRESSION: 1. Interim placement of  right-sided ureteral stent with decreased hydronephrosis compared to prior. Previously noted ureteral stones are not clearly identified. Mild urothelial enhancement on the right with perinephric and periureteral stranding, patchy hypoenhancement involving the cortex of the right kidney suspicious for ascending urinary tract infection and pyelonephritis. No abscess. 2. Nonobstructing left kidney stones. 3. Complex right adnexal cyst with septation measuring 7 cm, previously 7 cm. This was previously characterized as possible hemorrhagic cyst or endometrioma on ultrasound from 06/11/2023. Reference that report for additional recommendations Electronically Signed   By: Jasmine Pang M.D.   On: 06/14/2023 15:21   DG Chest Port 1 View Result Date: 06/14/2023 CLINICAL DATA:  Febrile. EXAM: PORTABLE CHEST 1 VIEW COMPARISON:  Chest radiograph dated 05/11/2017. FINDINGS: The heart size and mediastinal contours are within normal limits. No focal consolidation, pleural effusion, or pneumothorax. No acute osseous abnormality. IMPRESSION: No acute cardiopulmonary findings. Electronically Signed   By: Hart Robinsons M.D.   On: 06/14/2023 14:55   Scheduled Meds:  amLODipine  10 mg Oral Daily   potassium chloride  40 mEq Oral Once   tamsulosin  0.4 mg Oral QPC supper   Continuous Infusions:  cefTRIAXone (ROCEPHIN)  IV      LOS: 1 day   Shon Hale M.D on 06/15/2023 at 10:35 AM  Go to [email or web address] - for contact info  [facility] - Office  [phone]  If 7PM-7AM, please contact night-coverage [email or web address] 06/15/2023, 10:35 AM

## 2023-06-16 DIAGNOSIS — N12 Tubulo-interstitial nephritis, not specified as acute or chronic: Secondary | ICD-10-CM | POA: Diagnosis not present

## 2023-06-16 LAB — CBC
HCT: 35.7 % — ABNORMAL LOW (ref 36.0–46.0)
Hemoglobin: 11.6 g/dL — ABNORMAL LOW (ref 12.0–15.0)
MCH: 28 pg (ref 26.0–34.0)
MCHC: 32.5 g/dL (ref 30.0–36.0)
MCV: 86.2 fL (ref 80.0–100.0)
Platelets: 268 10*3/uL (ref 150–400)
RBC: 4.14 MIL/uL (ref 3.87–5.11)
RDW: 15.7 % — ABNORMAL HIGH (ref 11.5–15.5)
WBC: 8.1 10*3/uL (ref 4.0–10.5)
nRBC: 0 % (ref 0.0–0.2)

## 2023-06-16 MED ORDER — POLYETHYLENE GLYCOL 3350 17 G PO PACK: 17.0000 g | PACK | Freq: Every day | ORAL | 0 refills | Status: AC

## 2023-06-16 MED ORDER — ACETAMINOPHEN 325 MG PO TABS: 650.0000 mg | ORAL_TABLET | Freq: Four times a day (QID) | ORAL | Status: DC | PRN

## 2023-06-16 MED ORDER — CEPHALEXIN 500 MG PO CAPS: 500.0000 mg | ORAL_CAPSULE | Freq: Three times a day (TID) | ORAL | 0 refills | Status: AC

## 2023-06-16 MED ORDER — SODIUM CHLORIDE 0.9 % IV SOLN
2.0000 g | Freq: Once | INTRAVENOUS | Status: AC
  Administered 2023-06-16: 2 g via INTRAVENOUS
  Filled 2023-06-16: qty 20

## 2023-06-16 MED ORDER — SENNOSIDES-DOCUSATE SODIUM 8.6-50 MG PO TABS
2.0000 | ORAL_TABLET | Freq: Every day | ORAL | 0 refills | Status: DC
Start: 2023-06-16 — End: 2023-11-21

## 2023-06-16 NOTE — Discharge Summary (Signed)
 Bianca [name], is a 38 y.o. female  DOB 1985/05/11  MRN [id number].  Admission date:  06/14/2023  Admitting Physician  Onnie Boer, MD  Discharge Date:  06/16/2023   Primary MD  [facility]  Recommendations for primary care physician for things to follow:   1) follow-up with your Urologist Dr. Jerilee Field as previously advised 2) drink plenty fluids and avoid dehydration  Admission Diagnosis  Pyelonephritis [N12] Sepsis, due to unspecified organism, unspecified whether acute organ dysfunction present [facility]) [A41.9]   Discharge Diagnosis  Pyelonephritis [N12] Sepsis, due to unspecified organism, unspecified whether acute organ dysfunction present [facility]) [A41.9]    Principal Problem:   Pyelonephritis Active Problems:   Sepsis (HCC)   Hypokalemia   Essential hypertension      Past Medical History:  Diagnosis Date   GERD (gastroesophageal reflux disease)    History of kidney stones    History of recurrent UTIs    Hypertension    Kidney stones     Past Surgical History:  Procedure Laterality Date   CYSTOSCOPY WITH STENT PLACEMENT Right 06/14/2019   Procedure: Cystoscopy, Right Retrograde Pyelogram, Right Stent Placement;  Surgeon: Malen Gauze, MD;  Location: [facility];  Service: Urology;  Laterality: Right;   CYSTOSCOPY/RETROGRADE/URETEROSCOPY/STONE EXTRACTION WITH BASKET Right 06/12/2023   Procedure: CYSTOSCOPY/RETROGRADE/URETEROSCOPY/STONE EXTRACTION WITH BASKET AND STENT PLACEMENT;  Surgeon: Jerilee Field, MD;  Location: [facility];  Service: Urology;  Laterality: Right;   CYSTOSCOPY/URETEROSCOPY/HOLMIUM LASER/STENT PLACEMENT Right 07/08/2019   Procedure: CYSTOSCOPY/RIGHT URETEROSCOPY/RIGHT RETROGRADE PYELOGRAM/STENT EXCHANGE;  Surgeon: Malen Gauze, MD;  Location: [facility];  Service: Urology;  Laterality: Right;   HOLMIUM LASER APPLICATION Right  07/08/2019   Procedure: HOLMIUM LASER LITHOTRIPSY RIGHT URETERAL CALCULUS;  Surgeon: Malen Gauze, MD;  Location: [facility];  Service: Urology;  Laterality: Right;   HOLMIUM LASER APPLICATION Right 06/12/2023   Procedure: HOLMIUM LASER APPLICATION;  Surgeon: Jerilee Field, MD;  Location: [facility];  Service: Urology;  Laterality: Right;   NO PAST SURGERIES       HPI  from the history and physical done on the day of admission:    Chief Complaint: Fever, abdominal pain   HPI: Bianca [name] is a 38 y.o. female with medical history significant for nephrolithiasis, hypertension, depression. Patient presented to the ED with complaints of generalized weakness, lower abdominal pain, and fever of up to 102.6, with passage of small blood clots in her urine. Recently hospitalized 2/16 and was just discharged yesterday 2/18 for right obstructing ureteral stone, and had stent placed by Dr. Mena Goes.   She felt better until last night when her symptoms started.  She reports nausea without vomiting.  No respiratory symptoms.   ED Course: Tmax of 100.7.  Tachycardic heart rate 92-120.  Respiratory rate 14-18.  Blood pressure systolic 133-158.  O2 sats greater than 97% on room air. WBC 11.8.  Potassium 3.2. Lactic acid 2 > 0.7. CT abdomen and pelvis with contrast-interim placement of right sided ureteral stent with decreased hydronephrosis.  Mild  urothelial enhancement on the right with perinephric and periureteric stranding, patchy hypoenhancement involving the cortex of the right kidney suspicious for ascending urinary tract infection and pyelonephritis. No abscess. 2 g IV ceftriaxone given, 1 L bolus given. EDP talked to urologist Dr. Ronne Binning, recommended admission, given recent stent placement.   Review of Systems: As per HPI all other systems reviewed and negative.    Hospital Course:     Brief Summary:-  37 y.o. female with medical history significant for nephrolithiasis, hypertension,  depression readmitted on 06/14/2023 with concerns for pyelonephritis after Cystoscopy with right retrograde pyelogram, right ureteroscopy laser lithotripsy and right ureteral stent placement  on 06/12/23     -Assessment and Plan: 1)Sepsis secondary to acute right-sided pyelonephritis--POA - Recent stent placement by Dr. Eskridge-06/12/23-please see brief summary section above - CT showing perinephric and periureteric stranding, patchy hypoenhancement of right kidney cortex suspicious for ascending UTI and pyelonephritis.   -Treated with IV ceftriaxone  -Blood and urine culture NGTD -Okay to discharge on Keflex -Chills and nausea has resolved -Suprapubic and lower abdominal discomfort has resolved, no further right flank discomfort --WBC 15.4 >>11.8 >>12.6>.8.1 -Patient with recurrent nephrolithiasis, - continue Flomax to help passage of stones -Outpatient follow-up with urologist advised   2)Hypokalemia--normalized after replacement   3)Essential hypertension Stable. -Continue amlodipine   4) constipation--patient had BM after laxatives     5)Class 1 Obesity- -Low calorie diet, portion control and increase physical activity discussed with patient -Body mass index is 31.09 kg/m.  Discharge Condition: Stable  Follow UP   Follow-up Information     Jerilee Field, MD. Schedule an appointment as soon as possible for a visit.   Specialty: Urology Contact information: [address] [place] [address] [phone]                 Diet and Activity recommendation:  As advised  Discharge Instructions    Discharge Instructions     Call MD for:  difficulty breathing, headache or visual disturbances   Complete by: As directed    Call MD for:  persistant dizziness or light-headedness   Complete by: As directed    Call MD for:  persistant nausea and vomiting   Complete by: As directed    Call MD for:  temperature >100.4   Complete by: As directed     Diet - low sodium heart healthy   Complete by: As directed    Discharge instructions   Complete by: As directed    1) follow-up with your Urologist Dr. Jerilee Field as previously advised 2) drink plenty fluids and avoid dehydration   Increase activity slowly   Complete by: As directed          Discharge Medications     Allergies as of 06/16/2023       Reactions   Codeine Hives, Shortness Of Breath   Tolerates hydromorphone   Hydrocodone Hives        Medication List     STOP taking these medications    MOTRIN PO       TAKE these medications    acetaminophen 325 MG tablet Commonly known as: TYLENOL Take 2 tablets (650 mg total) by mouth every 6 (six) hours as needed for mild pain (pain score 1-3) (or Fever >/= 101).   amLODipine 10 MG tablet Commonly known as: NORVASC Take 10 mg by mouth daily.   cephALEXin 500 MG capsule Commonly known as: KEFLEX Take 1 capsule (500 mg total) by mouth  3 (three) times daily for 3 days. Start taking on: June 17, 2023   norethindrone 0.35 MG tablet Commonly known as: MICRONOR Take 1 tablet by mouth daily.   ondansetron 4 MG disintegrating tablet Commonly known as: ZOFRAN-ODT Take 4 mg by mouth every 4 (four) hours as needed for nausea or vomiting.   polyethylene glycol 17 g packet Commonly known as: MIRALAX / GLYCOLAX Take 17 g by mouth daily. Start taking on: June 17, 2023   senna-docusate 8.6-50 MG tablet Commonly known as: Senokot-S Take 2 tablets by mouth at bedtime.   tamsulosin 0.4 MG Caps capsule Commonly known as: FLOMAX Take 0.4 mg by mouth daily as needed (renalcolic).        Major procedures and Radiology Reports - PLEASE review detailed and final reports for all details, in brief -   CT ABDOMEN PELVIS W CONTRAST Result Date: 06/14/2023 CLINICAL DATA:  Abdomen pain recent stent placement fever EXAM: CT ABDOMEN AND PELVIS WITH CONTRAST TECHNIQUE: Multidetector CT imaging of the abdomen  and pelvis was performed using the standard protocol following bolus administration of intravenous contrast. RADIATION DOSE REDUCTION: This exam was performed according to the departmental dose-optimization program which includes automated exposure control, adjustment of the mA and/or kV according to patient size and/or use of iterative reconstruction technique. CONTRAST:  OMNIPAQUE IOHEXOL 300 MG/ML  SOLN COMPARISON:  CT 06/11/2023, pelvic ultrasound 06/11/2023 FINDINGS: Lower chest: Lung bases demonstrate no acute airspace disease. Hepatobiliary: No focal liver abnormality is seen. No gallstones, gallbladder wall thickening, or biliary dilatation. Pancreas: Unremarkable. No pancreatic ductal dilatation or surrounding inflammatory changes. Spleen: Normal in size without focal abnormality. Adrenals/Urinary Tract: Adrenal glands are normal. Interim placement of right-sided ureteral stent with proximal pigtail within extrarenal pelvis and distal pigtail at the base of the bladder. Decreased hydronephrosis compared to prior with mild residual. Previously noted ureteral stones are not clearly identified. Mild urothelial enhancement on the right involving the renal pelvis and ureter with mild perinephric stranding and Peri ureteral stranding. Patchy hypoenhancement involving the cortex of the right kidney. No abscess. Nonobstructing left kidney stones measuring up to 4 mm in size. Stomach/Bowel: Stomach is within normal limits. Appendix appears normal. No evidence of bowel wall thickening, distention, or inflammatory changes. Vascular/Lymphatic: No significant vascular findings are present. No enlarged abdominal or pelvic lymph nodes. Reproductive: Uterus unremarkable. 3.2 cm left adnexal cyst, no specific imaging follow-up recommended. Complex right adnexal cyst with septation measuring 7 x 5.5 cm, previously 7 cm. Other: No free air.  No significant effusion Musculoskeletal: No acute or significant osseous  findings. IMPRESSION: 1. Interim placement of right-sided ureteral stent with decreased hydronephrosis compared to prior. Previously noted ureteral stones are not clearly identified. Mild urothelial enhancement on the right with perinephric and periureteral stranding, patchy hypoenhancement involving the cortex of the right kidney suspicious for ascending urinary tract infection and pyelonephritis. No abscess. 2. Nonobstructing left kidney stones. 3. Complex right adnexal cyst with septation measuring 7 cm, previously 7 cm. This was previously characterized as possible hemorrhagic cyst or endometrioma on ultrasound from 06/11/2023. Reference that report for additional recommendations Electronically Signed   By: Jasmine Pang M.D.   On: 06/14/2023 15:21   DG Chest Port 1 View Result Date: 06/14/2023 CLINICAL DATA:  Febrile. EXAM: PORTABLE CHEST 1 VIEW COMPARISON:  Chest radiograph dated 05/11/2017. FINDINGS: The heart size and mediastinal contours are within normal limits. No focal consolidation, pleural effusion, or pneumothorax. No acute osseous abnormality. IMPRESSION: No acute cardiopulmonary findings. Electronically  Signed   By: Hart Robinsons M.D.   On: 06/14/2023 14:55   DG C-Arm 1-60 Min-No Report Result Date: 06/12/2023 Fluoroscopy was utilized by the requesting physician.  No radiographic interpretation.   US PELVIC COMPLETE W TRANSVAGINAL AND TORSION R/O Result Date: 06/11/2023 CLINICAL DATA:  Right lower quadrant pain EXAM: TRANSABDOMINAL AND TRANSVAGINAL ULTRASOUND OF PELVIS DOPPLER ULTRASOUND OF OVARIES TECHNIQUE: Both transabdominal and transvaginal ultrasound examinations of the pelvis were performed. Transabdominal technique was performed for global imaging of the pelvis including uterus, ovaries, adnexal regions, and pelvic cul-de-sac. It was necessary to proceed with endovaginal exam following the transabdominal exam to visualize the uterus, endometrium, ovaries and adnexa. Color and  duplex Doppler ultrasound was utilized to evaluate blood flow to the ovaries. COMPARISON:  CT today. FINDINGS: Uterus Measurements: 9.0 x 4.6 x 5.4 cm = volume: 118 mL. No fibroids or other mass visualized. Endometrium Thickness: 19 mm in thickness.  No focal abnormality visualized. Right ovary Measurements: Large hypoechoic area in the right adnexa measuring 7.0 x 6.9 x 5.0 cm. This has low level internal echoes and is likely within the right ovary. This could reflect a hemorrhagic cyst or endometrioma. Left ovary Measurements: Cystic structure in the left adnexa measures 3.3 x 3.3 x 3.8 cm, likely within the left ovary. This appears to be a simple cyst. Pulsed Doppler evaluation of both ovaries demonstrates normal low-resistance arterial and venous waveforms. Other findings No abnormal free fluid. IMPRESSION: Complex hypoechoic area in the right ovary measuring up to 7 cm, most likely hemorrhagic cyst or endometrioma. 3.8 cm simple appearing cyst in the left ovary. These findings could be followed with repeat ultrasound in 3 months to assess stability. Electronically Signed   By: Charlett Nose M.D.   On: 06/11/2023 23:13   CT Renal Stone Study Result Date: 06/11/2023 CLINICAL DATA:  Right flank pain, left lower quadrant pain. Nausea, vomiting. EXAM: CT ABDOMEN AND PELVIS WITHOUT CONTRAST TECHNIQUE: Multidetector CT imaging of the abdomen and pelvis was performed following the standard protocol without IV contrast. RADIATION DOSE REDUCTION: This exam was performed according to the departmental dose-optimization program which includes automated exposure control, adjustment of the mA and/or kV according to patient size and/or use of iterative reconstruction technique. COMPARISON:  07/03/2019 FINDINGS: Lower chest: No acute abnormality Hepatobiliary: No focal hepatic abnormality. Gallbladder unremarkable. Pancreas: No focal abnormality or ductal dilatation. Spleen: No focal abnormality.  Normal size.  Adrenals/Urinary Tract: Adrenal glands normal. Moderate right hydronephrosis and hydroureter due to 4 mm distal right ureteral stone. Left lower pole nonobstructing nephrolithiasis. No hydronephrosis on the left. Urinary bladder normal. Stomach/Bowel: Normal appendix. Stomach, large and small bowel grossly unremarkable. Vascular/Lymphatic: No evidence of aneurysm or adenopathy. Reproductive: 7 cm mass in the right adnexa. It is difficult to determine if this is an exophytic/pedunculated fibroid or a right ovarian hemorrhagic cyst. 3.2 cm cyst in the left ovary. Other: No free fluid or free air. Musculoskeletal: No acute bony abnormality. IMPRESSION: 4 mm distal right ureteral stone with moderate right hydronephrosis and hydroureter. Left nephrolithiasis. 7 cm mass in the right adnexal region. This could reflect a pedunculated fibroid or right ovarian lesion, possibly hemorrhagic cyst. This could be further evaluated with pelvic ultrasound. Electronically Signed   By: Charlett Nose M.D.   On: 06/11/2023 20:33    Micro Results   Recent Results (from the past 240 hours)  Urine Culture     Status: None   Collection Time: 06/14/23 12:30 PM   Specimen: Urine,  Random  Result Value Ref Range Status   Specimen Description   Final    URINE, RANDOM Performed at [facility], [address], [place], [place] [address]    Special Requests   Final    NONE Reflexed from [id number] Performed at [facility], [address] [place], [place] [address]    Culture   Final    NO GROWTH Performed at [facility], [address] [place], [place] [address]    Report Status 06/15/2023 FINAL  Final  Blood culture (routine x 2)     Status: None (Preliminary result)   Collection Time: 06/14/23 12:56 PM   Specimen: BLOOD  Result Value Ref Range Status   Specimen Description BLOOD LEFT ANTECUBITAL  Final   Special Requests   Final    BOTTLES DRAWN AEROBIC AND ANAEROBIC Blood Culture adequate volume   Culture    Final    NO GROWTH 2 DAYS Performed at [facility], [address], [place], [place] [address]    Report Status PENDING  Incomplete  Resp panel by RT-PCR (RSV, Flu A&B, Covid) Anterior Nasal Swab     Status: None   Collection Time: 06/14/23  1:55 PM   Specimen: Anterior Nasal Swab  Result Value Ref Range Status   SARS Coronavirus 2 by RT PCR NEGATIVE NEGATIVE Final    Comment: (NOTE) SARS-CoV-2 target nucleic acids are NOT DETECTED.  The SARS-CoV-2 RNA is generally detectable in upper respiratory specimens during the acute phase of infection. The lowest concentration of SARS-CoV-2 viral copies this assay can detect is 138 copies/mL. A negative result does not preclude SARS-Cov-2 infection and should not be used as the sole basis for treatment or other patient management decisions. A negative result may occur with  improper specimen collection/handling, submission of specimen other than nasopharyngeal swab, presence of viral mutation(s) within the areas targeted by this assay, and inadequate number of viral copies(<138 copies/mL). A negative result must be combined with clinical observations, patient history, and epidemiological information. The expected result is Negative.  Fact Sheet for Patients:  BloggerCourse.com  Fact Sheet for Healthcare Providers:  SeriousBroker.it  This test is no t yet approved or cleared by the [place] FDA and  has been authorized for detection and/or diagnosis of SARS-CoV-2 by FDA under an Emergency Use Authorization (EUA). This EUA will remain  in effect (meaning this test can be used) for the duration of the COVID-19 declaration under Section 564(b)(1) of the Act, 21 U.S.C.section 360bbb-3(b)(1), unless the authorization is terminated  or revoked sooner.       Influenza A by PCR NEGATIVE NEGATIVE Final   Influenza B by PCR NEGATIVE NEGATIVE Final    Comment: (NOTE) The Xpert Xpress  SARS-CoV-2/FLU/RSV plus assay is intended as an aid in the diagnosis of influenza from Nasopharyngeal swab specimens and should not be used as a sole basis for treatment. Nasal washings and aspirates are unacceptable for Xpert Xpress SARS-CoV-2/FLU/RSV testing.  Fact Sheet for Patients: BloggerCourse.com  Fact Sheet for Healthcare Providers: SeriousBroker.it  This test is not yet approved or cleared by the [place] FDA and has been authorized for detection and/or diagnosis of SARS-CoV-2 by FDA under an Emergency Use Authorization (EUA). This EUA will remain in effect (meaning this test can be used) for the duration of the COVID-19 declaration under Section 564(b)(1) of the Act, 21 U.S.C. section 360bbb-3(b)(1), unless the authorization is terminated or revoked.     Resp Syncytial Virus by PCR NEGATIVE  NEGATIVE Final    Comment: (NOTE) Fact Sheet for Patients: BloggerCourse.com  Fact Sheet for Healthcare Providers: SeriousBroker.it  This test is not yet approved or cleared by the [place] FDA and has been authorized for detection and/or diagnosis of SARS-CoV-2 by FDA under an Emergency Use Authorization (EUA). This EUA will remain in effect (meaning this test can be used) for the duration of the COVID-19 declaration under Section 564(b)(1) of the Act, 21 U.S.C. section 360bbb-3(b)(1), unless the authorization is terminated or revoked.  Performed at [facility], [address], [place], [place] [address]   Blood culture (routine x 2)     Status: None (Preliminary result)   Collection Time: 06/14/23  2:13 PM   Specimen: BLOOD  Result Value Ref Range Status   Specimen Description BLOOD RIGHT ANTECUBITAL  Final   Special Requests   Final    BOTTLES DRAWN AEROBIC AND ANAEROBIC Blood Culture adequate volume   Culture   Final    NO GROWTH 2 DAYS Performed at [facility], [address], [place], [place] [address]    Report Status PENDING  Incomplete   Today   Subjective    Bianca [name] today has no new complaints -Husband at bedside, questions answered =-Voiding without hematuria or dysuria        No fever  Or chills   No Nausea, Vomiting or Diarrhea   Patient has been seen and examined prior to discharge   Objective   Blood pressure 107/68, pulse 62, temperature 97.7 F (36.5 C), temperature source Oral, resp. rate 16, height 5\' 2"  (1.575 m), weight 77.9 kg, last menstrual period 06/02/2023, SpO2 98%.   Intake/Output Summary (Last 24 hours) at 06/16/2023 1213 Last data filed at 06/16/2023 0900 Gross per 24 hour  Intake 580 ml  Output --  Net 580 ml   Exam Gen:- Awake Alert, no acute distress  HEENT:- [place].AT, No sclera icterus Neck-Supple Neck,No JVD,.  Lungs-  CTAB , good air movement bilaterally CV- S1, S2 normal, regular Abd-  +ve B.Sounds, Abd Soft, No tenderness, no CVA area tenderness Extremity/Skin:- No  edema,   good pulses Psych-affect is appropriate, oriented x3 Neuro-no new focal deficits, no tremors    Data Review   CBC w Diff:  Lab Results  Component Value Date   WBC 8.1 06/16/2023   HGB 11.6 (L) 06/16/2023   HGB 15.3 09/06/2016   HCT 35.7 (L) 06/16/2023   HCT 46.5 09/06/2016   PLT 268 06/16/2023   PLT 233 09/06/2016   LYMPHOPCT 14 06/14/2023   MONOPCT 7 06/14/2023   EOSPCT 0 06/14/2023   BASOPCT 0 06/14/2023   CMP:  Lab Results  Component Value Date   NA 138 06/15/2023   NA 140 09/06/2016   K 3.5 06/15/2023   CL 107 06/15/2023   CO2 22 06/15/2023   BUN 8 06/15/2023   BUN 10 09/06/2016   CREATININE 0.66 06/15/2023   CREATININE 0.86 10/15/2013   PROT 7.3 06/14/2023   PROT 6.8 09/06/2016   ALBUMIN 3.6 06/14/2023   ALBUMIN 4.3 09/06/2016   BILITOT 0.4 06/14/2023   BILITOT 0.2 09/06/2016   ALKPHOS 95 06/14/2023   AST 23 06/14/2023   ALT 21 06/14/2023   Total Discharge time is about 33  minutes  Shon Hale M.D on 06/16/2023 at 12:13 PM  Go to [email or web address] -  for contact info  [facility] - Office  [phone]

## 2023-06-16 NOTE — Progress Notes (Signed)
 Nsg Discharge Note  Admit Date:  06/14/2023 Discharge date: 06/16/2023   Bianca [name] to be D/C'd Home per MD order.  AVS completed.  Copy for chart, and copy for patient signed, and dated. Patient/caregiver able to verbalize understanding.  Discharge Medication: Allergies as of 06/16/2023       Reactions   Codeine Hives, Shortness Of Breath   Tolerates hydromorphone   Hydrocodone Hives        Medication List     STOP taking these medications    MOTRIN PO       TAKE these medications    acetaminophen 325 MG tablet Commonly known as: TYLENOL Take 2 tablets (650 mg total) by mouth every 6 (six) hours as needed for mild pain (pain score 1-3) (or Fever >/= 101).   amLODipine 10 MG tablet Commonly known as: NORVASC Take 10 mg by mouth daily.   cephALEXin 500 MG capsule Commonly known as: KEFLEX Take 1 capsule (500 mg total) by mouth 3 (three) times daily for 3 days. Start taking on: June 17, 2023   norethindrone 0.35 MG tablet Commonly known as: MICRONOR Take 1 tablet by mouth daily.   ondansetron 4 MG disintegrating tablet Commonly known as: ZOFRAN-ODT Take 4 mg by mouth every 4 (four) hours as needed for nausea or vomiting.   polyethylene glycol 17 g packet Commonly known as: MIRALAX / GLYCOLAX Take 17 g by mouth daily. Start taking on: June 17, 2023   senna-docusate 8.6-50 MG tablet Commonly known as: Senokot-S Take 2 tablets by mouth at bedtime.   tamsulosin 0.4 MG Caps capsule Commonly known as: FLOMAX Take 0.4 mg by mouth daily as needed (renalcolic).        Discharge Assessment: Vitals:   06/16/23 0428 06/16/23 0757  BP: (!) 95/53 107/68  Pulse: 66 62  Resp: 16   Temp: 97.9 F (36.6 C) 97.7 F (36.5 C)  SpO2: 98% 98%   Skin clean, dry and intact without evidence of skin break down, no evidence of skin tears noted. IV catheter discontinued intact. Site without signs and symptoms of complications - no redness or edema noted at  insertion site, patient denies c/o pain - only slight tenderness at site.  Dressing with slight pressure applied.  D/c Instructions-Education: Discharge instructions given to patient/family with verbalized understanding. D/c education completed with patient/family including follow up instructions, medication list, d/c activities limitations if indicated, with other d/c instructions as indicated by MD - patient able to verbalize understanding, all questions fully answered. Patient instructed to return to ED, call 911, or call MD for any changes in condition.  Patient escorted via WC, and D/C home via private auto.  Demetrio Lapping, LPN 0/34/7425 9:56 PM

## 2023-06-16 NOTE — Anesthesia Postprocedure Evaluation (Signed)
 Anesthesia Post Note  Patient: Bianca [name]  Procedure(s) Performed: CYSTOSCOPY/RETROGRADE/URETEROSCOPY/STONE EXTRACTION WITH BASKET AND STENT PLACEMENT (Right: Ureter) HOLMIUM LASER APPLICATION (Right: Ureter)  Patient location during evaluation: Phase II Anesthesia Type: General Level of consciousness: awake Pain management: pain level controlled Vital Signs Assessment: post-procedure vital signs reviewed and stable Respiratory status: spontaneous breathing and respiratory function stable Cardiovascular status: blood pressure returned to baseline and stable Postop Assessment: no headache and no apparent nausea or vomiting Anesthetic complications: no Comments: Late entry   No notable events documented.   Last Vitals:  Vitals:   06/13/23 0408 06/13/23 1427  BP: 131/73 (!) 125/55  Pulse: 68 65  Resp:  17  Temp: 36.8 C 37 C  SpO2: 97% 99%    Last Pain:  Vitals:   06/13/23 1427  TempSrc: Oral  PainSc: 6                  Windell Norfolk

## 2023-06-16 NOTE — Discharge Instructions (Signed)
 1) follow-up with your Urologist Dr. Jerilee Field as previously advised 2) drink plenty fluids and avoid dehydration

## 2023-06-19 LAB — CALCULI, WITH PHOTOGRAPH (CLINICAL LAB)
Calcium Oxalate Dihydrate: 40 %
Calcium Oxalate Monohydrate: 20 %
Hydroxyapatite: 40 %
Weight Calculi: 38 mg

## 2023-06-19 LAB — CULTURE, BLOOD (ROUTINE X 2)
Culture: NO GROWTH
Culture: NO GROWTH
Special Requests: ADEQUATE
Special Requests: ADEQUATE

## 2023-06-21 ENCOUNTER — Ambulatory Visit (HOSPITAL_COMMUNITY)
Admission: RE | Admit: 2023-06-21 | Discharge: 2023-06-21 | Disposition: A | Discharge status: Home / Self Care | Payer: BC Managed Care – PPO | Source: Ambulatory Visit | Attending: Urology | Admitting: Urology

## 2023-06-21 ENCOUNTER — Encounter (HOSPITAL_COMMUNITY): Payer: Self-pay | Admitting: Obstetrics & Gynecology

## 2023-06-21 DIAGNOSIS — N12 Tubulo-interstitial nephritis, not specified as acute or chronic: Secondary | ICD-10-CM | POA: Diagnosis present

## 2023-06-21 NOTE — Progress Notes (Signed)
 Spoke w/ via phone for pre-op interview--- Bianca [name] needs dos----  UPT, CMP, CBC and T&S per surgeon. EKG per anesthesia.        [name] results------ COVID test -----patient states asymptomatic no test needed Arrive at -------0530 NPO after MN NO Solid Food.   Pre-Surgery Ensure or G2:  Med rec completed Medications to take morning of surgery ----- Norvasc and Norethindrone Diabetic medication -----  GLP1 agonist last dose: GLP1 instructions:  Patient instructed no nail polish to be worn day of surgery Patient instructed to bring photo id and insurance card day of surgery Patient aware to have Driver (ride ) / caregiver    for 24 hours after surgery - Husband Bianca [name] Patient Special Instructions -----Shower with antibacterial soap. Pre-Op special Instructions -----  Patient verbalized understanding of instructions that were given at this phone interview. Patient denies chest pain, sob, fever, cough at the interview.

## 2023-06-22 ENCOUNTER — Telehealth: Payer: Self-pay

## 2023-06-22 NOTE — Telephone Encounter (Signed)
 Patient called with questions regarding her stent.  Per Dr. Estil Daft op note she has a tethered stent in.  Evette Georges, FNP reviewed op note and gave verbal ok for patient to pull stent.  She was advised on how to remove stent and advised to go to the ER if she had any pain after removing stent.  Patient voiced understanding.

## 2023-06-30 ENCOUNTER — Telehealth: Payer: Self-pay

## 2023-06-30 NOTE — H&P (Signed)
 Bianca [name] is an 38 y.o. female [phone] with pelvic pain, dyspareunia, and dysmenorrhea.  U/s on 04/07/23 showed 112 cc uterine volume with no intracavitary mass following SIS.  Right ovary with cysts containing diffuse echoes: 41 x 37 mm and 23 x 21 mm likely representing endometriomas.  Also, a simple cyst measuring 22 x 19 mm was noted.  Left ovary with simple cyst 27 x 23 mm.  No FF.  Patient has HTN which is well controlled on two medications but unable to use estrogen containing contraception.    Pertinent Gynecological History: Menses: flow is light Bleeding: dysfunctional uterine bleeding Contraception: oral progesterone-only contraceptive DES exposure: unknown Blood transfusions: none Sexually transmitted diseases: no past history Previous GYN Procedures:  n/a   Last mammogram:  n/a  Date: n/a Last pap: normal Date: 01/04/23 OB History: G4, P2113   Menstrual History: Menarche age: n/a Patient's last menstrual period was 06/02/2023 (exact date).    Past Medical History:  Diagnosis Date   GERD (gastroesophageal reflux disease)    History of kidney stones    History of recurrent UTIs    Hypertension    Kidney stones     Past Surgical History:  Procedure Laterality Date   CYSTOSCOPY WITH STENT PLACEMENT Right 06/14/2019   Procedure: Cystoscopy, Right Retrograde Pyelogram, Right Stent Placement;  Surgeon: Malen Gauze, MD;  Location: [facility];  Service: Urology;  Laterality: Right;   CYSTOSCOPY/RETROGRADE/URETEROSCOPY/STONE EXTRACTION WITH BASKET Right 06/12/2023   Procedure: CYSTOSCOPY/RETROGRADE/URETEROSCOPY/STONE EXTRACTION WITH BASKET AND STENT PLACEMENT;  Surgeon: Jerilee Field, MD;  Location: [facility];  Service: Urology;  Laterality: Right;   CYSTOSCOPY/URETEROSCOPY/HOLMIUM LASER/STENT PLACEMENT Right 07/08/2019   Procedure: CYSTOSCOPY/RIGHT URETEROSCOPY/RIGHT RETROGRADE PYELOGRAM/STENT EXCHANGE;  Surgeon: Malen Gauze, MD;  Location: [facility];  Service:  Urology;  Laterality: Right;   HOLMIUM LASER APPLICATION Right 07/08/2019   Procedure: HOLMIUM LASER LITHOTRIPSY RIGHT URETERAL CALCULUS;  Surgeon: Malen Gauze, MD;  Location: [facility];  Service: Urology;  Laterality: Right;   HOLMIUM LASER APPLICATION Right 06/12/2023   Procedure: HOLMIUM LASER APPLICATION;  Surgeon: Jerilee Field, MD;  Location: [facility];  Service: Urology;  Laterality: Right;   NO PAST SURGERIES      Family History  Problem Relation Age of Onset   Heart disease Paternal Grandmother    Cancer Paternal Grandmother        breast   Thyroid disease Father    Cancer Father        lung   Hodgkin's lymphoma Father    Sudden Cardiac Death Father        Was not sure if was an MI or not.  Was during cancer treatment.  No autopsy done   Kidney disease Mother        GN3N   Other Maternal Grandmother        Guillian Barre syndrome    Social History:  reports that she has been smoking cigarettes. She started smoking about 19 years ago. She has a 5 pack-year smoking history. She has never used smokeless tobacco. She reports that she does not drink alcohol and does not use drugs.  Allergies:  Allergies  Allergen Reactions   Codeine Hives and Shortness Of Breath    Tolerates hydromorphone   Hydrocodone Hives    No medications prior to admission.    Review of Systems  Last menstrual period 06/02/2023. Physical Exam Constitutional:      Appearance: Normal appearance.  HENT:     Head: Normocephalic and atraumatic.  Pulmonary:     Effort: Pulmonary effort is normal.  Abdominal:     Palpations: Abdomen is soft.  Musculoskeletal:        General: Normal range of motion.     Cervical back: Normal range of motion.  Skin:    General: Skin is warm and dry.  Neurological:     Mental Status: She is alert and oriented to person, place, and time.  Psychiatric:        Mood and Affect: Mood normal.        Behavior: Behavior normal.     No results found for this  or any previous visit (from the past 24 hours).  No results found.  Assessment/Plan: 37yo with pelvic pain and right adnexal mass -L/S right ovarian cystectomy, possible right oophorectomy, possible removal of endometriosis, Mirena IUD insertion -Patient is counseled re: risk of bleeding, infection, scarring and damage to surrounding structures.  She is informed of steps of procedure as well as postop expectations and limitations.  All questions were answered and patient wishes to proceed.     Mitchel Honour 06/30/2023, 8:14 AM

## 2023-06-30 NOTE — Telephone Encounter (Signed)
 Called Pt to confirm if she pulled stent out per MD Eskridge Per pt she has pulled out stent

## 2023-06-30 NOTE — Telephone Encounter (Signed)
-----   Message from Jerilee Field sent at 06/29/2023  5:38 PM EST ----- Check with patient -- I left a string on her stent. Did she pull the stent out? ----- Message ----- From: Sarajane Jews, CMA Sent: 06/26/2023   7:59 AM EST To: Jerilee Field, MD  Please review.

## 2023-07-03 ENCOUNTER — Ambulatory Visit (HOSPITAL_COMMUNITY): Admitting: Certified Registered Nurse Anesthetist

## 2023-07-03 ENCOUNTER — Ambulatory Visit (HOSPITAL_COMMUNITY)
Admission: RE | Admit: 2023-07-03 | Discharge: 2023-07-03 | Disposition: A | Discharge status: Home / Self Care | Payer: BC Managed Care – PPO | Attending: Obstetrics & Gynecology | Admitting: Obstetrics & Gynecology

## 2023-07-03 ENCOUNTER — Other Ambulatory Visit: Payer: Self-pay

## 2023-07-03 ENCOUNTER — Encounter (HOSPITAL_COMMUNITY)
Admission: RE | Disposition: A | Discharge status: Home / Self Care | Payer: Self-pay | Source: Home / Self Care | Attending: Obstetrics & Gynecology

## 2023-07-03 ENCOUNTER — Encounter (HOSPITAL_COMMUNITY): Payer: Self-pay | Admitting: Obstetrics & Gynecology

## 2023-07-03 DIAGNOSIS — R102 Pelvic and perineal pain: Secondary | ICD-10-CM | POA: Insufficient documentation

## 2023-07-03 DIAGNOSIS — O10019 Pre-existing essential hypertension complicating pregnancy, unspecified trimester: Secondary | ICD-10-CM

## 2023-07-03 DIAGNOSIS — N946 Dysmenorrhea, unspecified: Secondary | ICD-10-CM | POA: Diagnosis not present

## 2023-07-03 DIAGNOSIS — K219 Gastro-esophageal reflux disease without esophagitis: Secondary | ICD-10-CM | POA: Insufficient documentation

## 2023-07-03 DIAGNOSIS — F17210 Nicotine dependence, cigarettes, uncomplicated: Secondary | ICD-10-CM | POA: Diagnosis not present

## 2023-07-03 DIAGNOSIS — N941 Unspecified dyspareunia: Secondary | ICD-10-CM | POA: Insufficient documentation

## 2023-07-03 DIAGNOSIS — I10 Essential (primary) hypertension: Secondary | ICD-10-CM | POA: Diagnosis not present

## 2023-07-03 DIAGNOSIS — K660 Peritoneal adhesions (postprocedural) (postinfection): Secondary | ICD-10-CM | POA: Diagnosis not present

## 2023-07-03 DIAGNOSIS — N83201 Unspecified ovarian cyst, right side: Secondary | ICD-10-CM | POA: Diagnosis not present

## 2023-07-03 DIAGNOSIS — Z87442 Personal history of urinary calculi: Secondary | ICD-10-CM | POA: Insufficient documentation

## 2023-07-03 DIAGNOSIS — N80101 Endometriosis of right ovary, unspecified depth: Secondary | ICD-10-CM | POA: Diagnosis not present

## 2023-07-03 DIAGNOSIS — Z87440 Personal history of urinary (tract) infections: Secondary | ICD-10-CM | POA: Insufficient documentation

## 2023-07-03 HISTORY — PX: LAPAROSCOPIC OVARIAN CYSTECTOMY: SHX6248

## 2023-07-03 HISTORY — PX: LAPAROSCOPY: SHX197

## 2023-07-03 HISTORY — PX: INTRAUTERINE DEVICE (IUD) INSERTION: SHX5877

## 2023-07-03 LAB — POCT PREGNANCY, URINE: Preg Test, Ur: NEGATIVE

## 2023-07-03 LAB — COMPREHENSIVE METABOLIC PANEL
ALT: 16 U/L (ref 0–44)
AST: 17 U/L (ref 15–41)
Albumin: 3.7 g/dL (ref 3.5–5.0)
Alkaline Phosphatase: 106 U/L (ref 38–126)
Anion gap: 8 (ref 5–15)
BUN: 6 mg/dL (ref 6–20)
CO2: 22 mmol/L (ref 22–32)
Calcium: 8.5 mg/dL — ABNORMAL LOW (ref 8.9–10.3)
Chloride: 107 mmol/L (ref 98–111)
Creatinine, Ser: 0.83 mg/dL (ref 0.44–1.00)
GFR, Estimated: >60 mL/min (ref >60)
Glucose, Bld: 119 mg/dL — ABNORMAL HIGH (ref 70–99)
Potassium: 3.4 mmol/L — ABNORMAL LOW (ref 3.5–5.1)
Sodium: 137 mmol/L (ref 135–145)
Total Bilirubin: 0.5 mg/dL (ref 0.0–1.2)
Total Protein: 7.1 g/dL (ref 6.5–8.1)

## 2023-07-03 LAB — CBC
HCT: 40.1 % (ref 36.0–46.0)
Hemoglobin: 13.2 g/dL (ref 12.0–15.0)
MCH: 27.7 pg (ref 26.0–34.0)
MCHC: 32.9 g/dL (ref 30.0–36.0)
MCV: 84.2 fL (ref 80.0–100.0)
Platelets: 375 10*3/uL (ref 150–400)
RBC: 4.76 MIL/uL (ref 3.87–5.11)
RDW: 14.6 % (ref 11.5–15.5)
WBC: 9.1 10*3/uL (ref 4.0–10.5)
nRBC: 0 % (ref 0.0–0.2)

## 2023-07-03 LAB — TYPE AND SCREEN
ABO/RH(D): O POS
Antibody Screen: NEGATIVE

## 2023-07-03 SURGERY — EXCISION, CYST, OVARY, LAPAROSCOPIC
Anesthesia: General | Site: Uterus | Laterality: Right

## 2023-07-03 MED ORDER — FENTANYL CITRATE (PF) 100 MCG/2ML IJ SOLN
INTRAMUSCULAR | Status: AC
  Filled 2023-07-03: qty 2

## 2023-07-03 MED ORDER — ACETAMINOPHEN 500 MG PO TABS
ORAL_TABLET | ORAL | Status: DC
Start: 2023-07-03 — End: 2023-07-03
  Filled 2023-07-03: qty 2

## 2023-07-03 MED ORDER — MIDAZOLAM HCL 5 MG/5ML IJ SOLN
INTRAMUSCULAR | Status: DC | PRN
  Administered 2023-07-03: 2 mg via INTRAVENOUS

## 2023-07-03 MED ORDER — LACTATED RINGERS IV SOLN: INTRAVENOUS | Status: DC

## 2023-07-03 MED ORDER — DEXAMETHASONE SODIUM PHOSPHATE 10 MG/ML IJ SOLN
INTRAMUSCULAR | Status: AC
  Filled 2023-07-03: qty 1

## 2023-07-03 MED ORDER — LEVONORGESTREL 20 MCG/DAY IU IUD
1.0000 | INTRAUTERINE_SYSTEM | INTRAUTERINE | Status: AC
  Administered 2023-07-03: 1 via INTRAUTERINE
  Filled 2023-07-03: qty 1

## 2023-07-03 MED ORDER — ROCURONIUM BROMIDE 10 MG/ML (PF) SYRINGE
PREFILLED_SYRINGE | INTRAVENOUS | Status: AC
  Filled 2023-07-03: qty 10

## 2023-07-03 MED ORDER — DEXAMETHASONE SODIUM PHOSPHATE 10 MG/ML IJ SOLN
INTRAMUSCULAR | Status: DC | PRN
  Administered 2023-07-03: 10 mg via INTRAVENOUS

## 2023-07-03 MED ORDER — BUPIVACAINE HCL (PF) 0.5 % IJ SOLN
INTRAMUSCULAR | Status: AC
  Filled 2023-07-03: qty 30

## 2023-07-03 MED ORDER — CHLORHEXIDINE GLUCONATE 0.12 % MT SOLN
15.0000 mL | Freq: Once | OROMUCOSAL | Status: AC
  Administered 2023-07-03: 15 mL via OROMUCOSAL

## 2023-07-03 MED ORDER — SODIUM CHLORIDE 0.9 % IR SOLN
Status: DC | PRN
  Administered 2023-07-03: 1000 mL

## 2023-07-03 MED ORDER — AMISULPRIDE (ANTIEMETIC) 5 MG/2ML IV SOLN: 10.0000 mg | Freq: Once | INTRAVENOUS | Status: DC | PRN

## 2023-07-03 MED ORDER — ONDANSETRON HCL 4 MG/2ML IJ SOLN
INTRAMUSCULAR | Status: AC
  Filled 2023-07-03: qty 2

## 2023-07-03 MED ORDER — MIDAZOLAM HCL 2 MG/2ML IJ SOLN
INTRAMUSCULAR | Status: AC
  Filled 2023-07-03: qty 2

## 2023-07-03 MED ORDER — FENTANYL CITRATE (PF) 250 MCG/5ML IJ SOLN
INTRAMUSCULAR | Status: AC
  Filled 2023-07-03: qty 5

## 2023-07-03 MED ORDER — FENTANYL CITRATE (PF) 100 MCG/2ML IJ SOLN
25.0000 ug | INTRAMUSCULAR | Status: DC | PRN
Start: 2023-07-03 — End: 2023-07-03
  Administered 2023-07-03 (×2): 50 ug via INTRAVENOUS

## 2023-07-03 MED ORDER — HYDROMORPHONE HCL 2 MG PO TABS: 2.0000 mg | ORAL_TABLET | Freq: Four times a day (QID) | ORAL | 0 refills | Status: AC | PRN

## 2023-07-03 MED ORDER — BUPIVACAINE HCL (PF) 0.25 % IJ SOLN
INTRAMUSCULAR | Status: AC
  Filled 2023-07-03: qty 30

## 2023-07-03 MED ORDER — LIDOCAINE 2% (20 MG/ML) 5 ML SYRINGE
INTRAMUSCULAR | Status: DC | PRN
  Administered 2023-07-03: 40 mg via INTRAVENOUS

## 2023-07-03 MED ORDER — IBUPROFEN 600 MG PO TABS: 600.0000 mg | ORAL_TABLET | Freq: Four times a day (QID) | ORAL | Status: DC

## 2023-07-03 MED ORDER — ONDANSETRON HCL 4 MG/2ML IJ SOLN
INTRAMUSCULAR | Status: DC | PRN
  Administered 2023-07-03: 4 mg via INTRAVENOUS

## 2023-07-03 MED ORDER — PHENYLEPHRINE 80 MCG/ML (10ML) SYRINGE FOR IV PUSH (FOR BLOOD PRESSURE SUPPORT)
PREFILLED_SYRINGE | INTRAVENOUS | Status: DC | PRN
  Administered 2023-07-03: 80 ug via INTRAVENOUS

## 2023-07-03 MED ORDER — DEXMEDETOMIDINE HCL IN NACL 80 MCG/20ML IV SOLN
INTRAVENOUS | Status: AC
  Filled 2023-07-03: qty 20

## 2023-07-03 MED ORDER — FENTANYL CITRATE (PF) 100 MCG/2ML IJ SOLN
INTRAMUSCULAR | Status: DC | PRN
  Administered 2023-07-03: 100 ug via INTRAVENOUS
  Administered 2023-07-03 (×3): 50 ug via INTRAVENOUS

## 2023-07-03 MED ORDER — SUGAMMADEX SODIUM 200 MG/2ML IV SOLN
INTRAVENOUS | Status: DC | PRN
  Administered 2023-07-03: 200 mg via INTRAVENOUS

## 2023-07-03 MED ORDER — DEXMEDETOMIDINE HCL IN NACL 80 MCG/20ML IV SOLN
INTRAVENOUS | Status: DC | PRN
  Administered 2023-07-03 (×3): 8 ug via INTRAVENOUS

## 2023-07-03 MED ORDER — ORAL CARE MOUTH RINSE: 15.0000 mL | Freq: Once | OROMUCOSAL | Status: AC

## 2023-07-03 MED ORDER — ACETAMINOPHEN 500 MG PO TABS
1000.0000 mg | ORAL_TABLET | Freq: Once | ORAL | Status: AC
  Administered 2023-07-03: 1000 mg via ORAL

## 2023-07-03 MED ORDER — LIDOCAINE 2% (20 MG/ML) 5 ML SYRINGE
INTRAMUSCULAR | Status: AC
  Filled 2023-07-03: qty 5

## 2023-07-03 MED ORDER — CHLORHEXIDINE GLUCONATE 0.12 % MT SOLN
OROMUCOSAL | Status: AC
  Filled 2023-07-03: qty 15

## 2023-07-03 MED ORDER — PROPOFOL 10 MG/ML IV BOLUS
INTRAVENOUS | Status: AC
  Filled 2023-07-03: qty 20

## 2023-07-03 MED ORDER — KETOROLAC TROMETHAMINE 30 MG/ML IJ SOLN
INTRAMUSCULAR | Status: AC
  Filled 2023-07-03: qty 1

## 2023-07-03 MED ORDER — KETOROLAC TROMETHAMINE 30 MG/ML IJ SOLN
INTRAMUSCULAR | Status: DC | PRN
  Administered 2023-07-03: 30 mg via INTRAVENOUS

## 2023-07-03 MED ORDER — ROCURONIUM BROMIDE 10 MG/ML (PF) SYRINGE
PREFILLED_SYRINGE | INTRAVENOUS | Status: DC | PRN
Start: 2023-07-03 — End: 2023-07-03
  Administered 2023-07-03: 10 mg via INTRAVENOUS
  Administered 2023-07-03: 50 mg via INTRAVENOUS
  Administered 2023-07-03: 10 mg via INTRAVENOUS

## 2023-07-03 MED ORDER — ARTIFICIAL TEARS OPHTHALMIC OINT
TOPICAL_OINTMENT | OPHTHALMIC | Status: AC
  Filled 2023-07-03: qty 3.5

## 2023-07-03 MED ORDER — PROPOFOL 10 MG/ML IV BOLUS
INTRAVENOUS | Status: DC | PRN
  Administered 2023-07-03: 180 mg via INTRAVENOUS
  Administered 2023-07-03: 20 mg via INTRAVENOUS

## 2023-07-03 MED ORDER — PHENYLEPHRINE 80 MCG/ML (10ML) SYRINGE FOR IV PUSH (FOR BLOOD PRESSURE SUPPORT)
PREFILLED_SYRINGE | INTRAVENOUS | Status: AC
  Filled 2023-07-03: qty 10

## 2023-07-03 MED ORDER — BUPIVACAINE HCL (PF) 0.25 % IJ SOLN
INTRAMUSCULAR | Status: DC | PRN
  Administered 2023-07-03: 8 mL

## 2023-07-03 MED ORDER — POVIDONE-IODINE 10 % EX SWAB: 2.0000 | Freq: Once | CUTANEOUS | Status: DC

## 2023-07-03 SURGICAL SUPPLY — 38 items
APPLICATOR COTTON TIP 6 STRL (MISCELLANEOUS) IMPLANT
APPLICATOR COTTON TIP 6IN STRL (MISCELLANEOUS) ×4 IMPLANT
BARRIER ADHS 3X4 INTERCEED (GAUZE/BANDAGES/DRESSINGS) IMPLANT
BENZOIN TINCTURE PRP APPL 2/3 (GAUZE/BANDAGES/DRESSINGS) ×4 IMPLANT
CABLE HIGH FREQUENCY MONO STRZ (ELECTRODE) IMPLANT
CATH ROBINSON RED A/P 16FR (CATHETERS) ×3 IMPLANT
DEFOGGER SCOPE WARMER CLEARIFY (MISCELLANEOUS) ×1 IMPLANT
DERMABOND ADVANCED .7 DNX12 (GAUZE/BANDAGES/DRESSINGS) ×1 IMPLANT
DRAPE SURG IRRIG POUCH 19X23 (DRAPES) ×4 IMPLANT
DURAPREP 26ML APPLICATOR (WOUND CARE) ×4 IMPLANT
FORCEPS CUTTING 45CM 5MM (CUTTING FORCEPS) ×1 IMPLANT
GLOVE BIO SURGEON STRL SZ 6 (GLOVE) ×4 IMPLANT
GLOVE BIOGEL PI IND STRL 6 (GLOVE) ×8 IMPLANT
GLOVE BIOGEL PI IND STRL 7.0 (GLOVE) ×3 IMPLANT
GLOVE BIOGEL PI IND STRL 7.5 (GLOVE) ×1 IMPLANT
GOWN STRL REUS W/ TWL LRG LVL3 (GOWN DISPOSABLE) ×12 IMPLANT
IRRIG SUCT STRYKERFLOW 2 WTIP (MISCELLANEOUS) ×4 IMPLANT
IRRIGATION SUCT STRKRFLW 2 WTP (MISCELLANEOUS) IMPLANT
KIT PINK PAD W/HEAD ARE REST (MISCELLANEOUS) ×4 IMPLANT
KIT PINK PAD W/HEAD ARM REST (MISCELLANEOUS) ×3 IMPLANT
KIT TURNOVER KIT B (KITS) ×4 IMPLANT
MIRENA IUD ×1 IMPLANT
NDL INSUFFLATION 14GA 120MM (NEEDLE) ×3 IMPLANT
NEEDLE INSUFFLATION 14GA 120MM (NEEDLE) ×4 IMPLANT
NS IRRIG 1000ML POUR BTL (IV SOLUTION) ×4 IMPLANT
PACK LAPAROSCOPY BASIN (CUSTOM PROCEDURE TRAY) ×4 IMPLANT
PROTECTOR NERVE ULNAR (MISCELLANEOUS) ×8 IMPLANT
SET TUBE SMOKE EVAC HIGH FLOW (TUBING) ×4 IMPLANT
SLEEVE Z-THREAD 5X100MM (TROCAR) IMPLANT
STRIP CLOSURE SKIN 1/2X4 (GAUZE/BANDAGES/DRESSINGS) ×4 IMPLANT
SUT MNCRL AB 3-0 PS2 27 (SUTURE) ×4 IMPLANT
SUT VICRYL 0 UR6 27IN ABS (SUTURE) ×4 IMPLANT
SYS BAG RETRIEVAL 10MM (BASKET) IMPLANT
SYSTEM BAG RETRIEVAL 10MM (BASKET) IMPLANT
TOWEL GREEN STERILE FF (TOWEL DISPOSABLE) ×8 IMPLANT
TROCAR 11X100 Z THREAD (TROCAR) ×4 IMPLANT
TROCAR XCEL NON-BLD 5MMX100MML (ENDOMECHANICALS) ×4 IMPLANT
WARMER LAPAROSCOPE (MISCELLANEOUS) ×4 IMPLANT

## 2023-07-03 NOTE — Discharge Instructions (Addendum)
 Call MD for T>100.4, heavy vaginal bleeding, severe abdominal pain, intractable nausea and/or vomiting, or respiratory distress.  Call office to schedule postop visit in 2 weeks.  Pelvic rest x 6 weeks.  No driving while taking narcotics.        No ibuprofen, Advil, Aleve, Motrin, ketorolac, meloxicam, naproxen, or other NSAIDS until after 2:40 pm today if needed.  No acetaminophen/Tylenol until after 12:30 pm today if needed.   Post Anesthesia Home Care Instructions  Activity: Get plenty of rest for the remainder of the day. A responsible individual must stay with you for 24 hours following the procedure.  For the next 24 hours, DO NOT: -Drive a car -Advertising copywriter -Drink alcoholic beverages -Take any medication unless instructed by your physician -Make any legal decisions or sign important papers.  Meals: Start with liquid foods such as gelatin or soup. Progress to regular foods as tolerated. Avoid greasy, spicy, heavy foods. If nausea and/or vomiting occur, drink only clear liquids until the nausea and/or vomiting subsides. Call your physician if vomiting continues.  Special Instructions/Symptoms: Your throat may feel dry or sore from the anesthesia or the breathing tube placed in your throat during surgery. If this causes discomfort, gargle with warm salt water. The discomfort should disappear within 24 hours.

## 2023-07-03 NOTE — Transfer of Care (Signed)
 Immediate Anesthesia Transfer of Care Note  Patient: Bianca [name]  Procedure(s) Performed: LAPAROSCOPIC OVARIAN CYST BIOPSY (Right: Abdomen) MIRENA INTRAUTERINE DEVICE (IUD) INSERTION (Uterus) DIAGNOSTIC LAPAROSCOPY (Abdomen)  Patient Location: PACU  Anesthesia Type:General  Level of Consciousness: awake, alert , oriented, and patient cooperative  Airway & Oxygen Therapy: Patient Spontanous Breathing and Patient connected to nasal cannula oxygen  Post-op Assessment: Report given to RN and Post -op Vital signs reviewed and stable  Post vital signs: Reviewed and stable  Last Vitals:  Vitals Value Taken Time  BP 119/65 07/03/23 0900  Temp 36.9 C 07/03/23 0900  Pulse 75 07/03/23 0901  Resp 17 07/03/23 0901  SpO2 91 % 07/03/23 0901  Vitals shown include unfiled device data.  Last Pain:  Vitals:   07/03/23 0610  TempSrc: Oral  PainSc: 3       Patients Stated Pain Goal: 5 (07/03/23 0610)  Complications: No notable events documented.

## 2023-07-03 NOTE — Anesthesia Procedure Notes (Signed)
 Procedure Name: Intubation Date/Time: 07/03/2023 7:33 AM  Performed by: Bishop Limbo, CRNAPre-anesthesia Checklist: Patient identified, Emergency Drugs available, Suction available and Patient being monitored Patient Re-evaluated:Patient Re-evaluated prior to induction Oxygen Delivery Method: Circle System Utilized Preoxygenation: Pre-oxygenation with 100% oxygen Induction Type: IV induction Ventilation: Mask ventilation without difficulty Laryngoscope Size: Mac and 3 Grade View: Grade I Tube type: Oral Tube size: 7.0 mm Number of attempts: 1 Airway Equipment and Method: Stylet Placement Confirmation: ETT inserted through vocal cords under direct vision, positive ETCO2 and breath sounds checked- equal and bilateral Secured at: 21 cm Tube secured with: Tape Dental Injury: Teeth and Oropharynx as per pre-operative assessment

## 2023-07-03 NOTE — Progress Notes (Signed)
 No change in H&P.  Mitchel Honour, DO

## 2023-07-03 NOTE — Op Note (Signed)
 Bianca [name] 07/03/2023  PREOPERATIVE DIAGNOSIS:  Right adnexal mass and pelvic pain  POSTOPERATIVE DIAGNOSIS:  SAA with Stage IV endometriosis  PROCEDURE:  Laparoscopic right ovarian cystotomy, cyst biopsy and Mirena insertion  ANESTHESIA:  General endotracheal  COMPLICATIONS:  None immediate.  PATHOLOGY:  Right ovarian cyst biopsy  ESTIMATED BLOOD LOSS:  Less than 20 ml.  INDICATIONS: 38 y.o.  with pelvic pain and right adnexal mass    FINDINGS:  Normal appearing liver edge.  Normal appearing bladder surface.  Normal appearing uterus, however, position deviated to left pelvis.  Normal left ovary and fallopian tube.  Right ovary enlarged by endometrioma and involving dense adhesions to the right pelvic sidewall, uterus, bowel and posterior cul-de-sac completely obliterated.  Right fallopian tube appears normal.    TECHNIQUE:  The patient was taken to the operating room where general anesthesia was obtained without difficulty.  She was then placed in the dorsal lithotomy position and prepared and draped in sterile fashion.  After an adequate timeout was performed, a bivalved speculum was then placed in the patient's vagina, and the anterior lip of cervix grasped with the single-tooth tenaculum.  The acorn manipulator was advanced into the uterus.  The speculum was removed from the vagina.  Attention was then turned to the patient's abdomen where a 10-mm skin incision was made on the umbilical fold.  The Veress needle was carefully introduced into the peritoneal cavity through the abdominal wall.  Intraperitoneal placement was confirmed by drop in intraabdominal pressure with insufflation of carbon dioxide gas.  Adequate pneumoperitoneum was obtained, and the 10/11 XL trocar was then advanced under direct visualization with the laparoscope without difficulty into the abdomen.  Next, a 5 mm skin incision was made in the suprapubic region.  The 5 mm trocar was advanced atraumatically.  The  above dictated findings were noted on pelvic/abdominal survey.  Filmy omental adhesions were removed from the right ovarian mass.  Attempt to mobilize the right ovary were unsuccessful given dense adhesions.  During attempt to manipulate the right ovary, cyst rupture occurred.  Dark brown contents drained from the cyst rupture.  The material was suction irrigated.  Additional attempts to mobilize the right ovary were unsuccessful; dense adhesions to the right pelvic sidewall, right posterior uterus and posterior cul-de-sac prevented identification of cyst wall and ovarian planes.  Using the Gyrus, bx was taken of the ovary and cyst wall.  This was sent to pathology.  Hemostasis was observed.  All instruments were removed from the abdomen.  The infraumbilical fascial incision was closed with 0 vicryl in a figure of eight stitch.  Both skin incisions were closed using 3-0 monocryl in a subcuticular fashion.  Surgical glue was applied.    The uterine manipulator was removed from the vagina.  The speculum was placed with the tenaculcum in place without complications. Uterus sounded to 7 cm.  Mirena IUD was opened and inserted per manufacturer's protocol without complication.  Strings were trimmed to 3 cm from the external os.  The patient tolerated the procedure well.  Sponge, lap, and needle counts were correct times two.  The patient was then taken to the recovery room awake, extubated and in stable  in stable condition.

## 2023-07-03 NOTE — Anesthesia Preprocedure Evaluation (Signed)
 Anesthesia Evaluation  Patient identified by MRN, date of birth, ID band Patient awake    Reviewed: Allergy & Precautions, NPO status , Patient's Chart, lab work & pertinent test results  Airway Mallampati: II  TM Distance: >3 FB Neck ROM: Full    Dental  (+) Dental Advisory Given   Pulmonary former smoker   breath sounds clear to auscultation       Cardiovascular hypertension, Pt. on medications  Rhythm:Regular Rate:Normal     Neuro/Psych negative neurological ROS     GI/Hepatic Neg liver ROS,GERD  ,,  Endo/Other  negative endocrine ROS    Renal/GU Renal disease     Musculoskeletal   Abdominal   Peds  Hematology negative hematology ROS (+)   Anesthesia Other Findings   Reproductive/Obstetrics                             Anesthesia Physical Anesthesia Plan  ASA: 2  Anesthesia Plan: General   Post-op Pain Management: Tylenol PO (pre-op)* and Toradol IV (intra-op)*   Induction: Intravenous  PONV Risk Score and Plan: 3 and Dexamethasone, Ondansetron, Treatment may vary due to age or medical condition and Midazolam  Airway Management Planned: Oral ETT  Additional Equipment: None  Intra-op Plan:   Post-operative Plan: Extubation in OR  Informed Consent: I have reviewed the patients History and Physical, chart, labs and discussed the procedure including the risks, benefits and alternatives for the proposed anesthesia with the patient or authorized representative who has indicated his/her understanding and acceptance.     Dental advisory given  Plan Discussed with: CRNA  Anesthesia Plan Comments:        Anesthesia Quick Evaluation

## 2023-07-04 ENCOUNTER — Encounter (HOSPITAL_COMMUNITY): Payer: Self-pay | Admitting: Obstetrics & Gynecology

## 2023-07-04 LAB — SURGICAL PATHOLOGY

## 2023-07-05 NOTE — Anesthesia Postprocedure Evaluation (Signed)
 Anesthesia Post Note  Patient: Bianca [name]  Procedure(s) Performed: LAPAROSCOPIC OVARIAN CYST BIOPSY (Right: Abdomen) MIRENA INTRAUTERINE DEVICE (IUD) INSERTION (Uterus) DIAGNOSTIC LAPAROSCOPY (Abdomen)     Patient location during evaluation: PACU Anesthesia Type: General Level of consciousness: awake and alert Pain management: pain level controlled Vital Signs Assessment: post-procedure vital signs reviewed and stable Respiratory status: spontaneous breathing, nonlabored ventilation, respiratory function stable and patient connected to nasal cannula oxygen Cardiovascular status: blood pressure returned to baseline and stable Postop Assessment: no apparent nausea or vomiting Anesthetic complications: no   No notable events documented.  Last Vitals:  Vitals:   07/03/23 0930 07/03/23 0942  BP: 103/70 104/68  Pulse: 82 76  Resp: 12 14  Temp:  36.7 C  SpO2: 100% 99%    Last Pain:  Vitals:   07/03/23 0942  TempSrc:   PainSc: 3                  Kennieth Rad

## 2023-07-25 ENCOUNTER — Telehealth: Payer: Self-pay

## 2023-07-25 DIAGNOSIS — N12 Tubulo-interstitial nephritis, not specified as acute or chronic: Secondary | ICD-10-CM

## 2023-07-25 NOTE — Telephone Encounter (Signed)
 Called to reschedule Pt appt as well as let her know MD Eskridge wants A [place] before new appt

## 2023-07-31 ENCOUNTER — Ambulatory Visit: Payer: BC Managed Care – PPO | Admitting: Urology

## 2023-09-05 ENCOUNTER — Other Ambulatory Visit (HOSPITAL_COMMUNITY): Payer: Self-pay | Admitting: Obstetrics and Gynecology

## 2023-09-05 DIAGNOSIS — N80512 Deep endometriosis of the rectum: Secondary | ICD-10-CM

## 2023-09-11 ENCOUNTER — Ambulatory Visit: Admitting: Urology

## 2023-09-12 ENCOUNTER — Ambulatory Visit (HOSPITAL_COMMUNITY)
Admission: RE | Admit: 2023-09-12 | Discharge: 2023-09-12 | Disposition: A | Discharge status: Home / Self Care | Source: Ambulatory Visit | Attending: Urology | Admitting: Urology

## 2023-09-12 DIAGNOSIS — N12 Tubulo-interstitial nephritis, not specified as acute or chronic: Secondary | ICD-10-CM | POA: Diagnosis present

## 2023-09-15 ENCOUNTER — Ambulatory Visit: Payer: Self-pay

## 2023-09-16 ENCOUNTER — Ambulatory Visit (HOSPITAL_COMMUNITY)
Admission: RE | Admit: 2023-09-16 | Discharge: 2023-09-16 | Disposition: A | Discharge status: Home / Self Care | Source: Ambulatory Visit | Attending: Obstetrics and Gynecology | Admitting: Obstetrics and Gynecology

## 2023-09-16 ENCOUNTER — Encounter (HOSPITAL_COMMUNITY): Payer: Self-pay | Admitting: Radiology

## 2023-09-16 DIAGNOSIS — N80512 Deep endometriosis of the rectum: Secondary | ICD-10-CM | POA: Insufficient documentation

## 2023-09-16 MED ORDER — GADOBUTROL 1 MMOL/ML IV SOLN
7.5000 mL | Freq: Once | INTRAVENOUS | Status: AC | PRN
  Administered 2023-09-16: 7.5 mL via INTRAVENOUS

## 2023-09-25 ENCOUNTER — Ambulatory Visit: Admitting: Urology

## 2023-10-23 ENCOUNTER — Ambulatory Visit (INDEPENDENT_AMBULATORY_CARE_PROVIDER_SITE_OTHER): Admitting: Urology

## 2023-10-23 VITALS — BP 141/98 | HR 75

## 2023-10-23 DIAGNOSIS — N202 Calculus of kidney with calculus of ureter: Secondary | ICD-10-CM

## 2023-10-23 DIAGNOSIS — R3129 Other microscopic hematuria: Secondary | ICD-10-CM

## 2023-10-23 DIAGNOSIS — N1339 Other hydronephrosis: Secondary | ICD-10-CM

## 2023-10-23 LAB — URINALYSIS, ROUTINE W REFLEX MICROSCOPIC
Bilirubin, UA: NEGATIVE
Glucose, UA: NEGATIVE
Ketones, UA: NEGATIVE
Nitrite, UA: NEGATIVE
Specific Gravity, UA: 1.02 (ref 1.005–1.030)
Urobilinogen, Ur: 0.2 mg/dL (ref 0.2–1.0)
pH, UA: 7.5 (ref 5.0–7.5)

## 2023-10-23 LAB — MICROSCOPIC EXAMINATION: WBC, UA: >30 /HPF — AB (ref 0–5)

## 2023-10-23 NOTE — Progress Notes (Signed)
 10/23/2023 11:06 AM   Bianca [name] [birth year] [id number]  Referring provider: [facility] [address] [place],  [place] [address]  No chief complaint on file.   HPI:  F/u -   1) urolithiasis -patient required right ureteroscopy with laser lithotripsy and stent placement with Dr. Sherrilee March 2021 for a 7 mm right distal ureteral stone with severe proximal right hydroureteronephrosis.  Postop CT in 2021 with the stent showed continued right hydronephrosis.  Stent was removed in the office.  No follow-up imaging.  She had recurrent right flank pain February 2025 and CT revealed a 4 mm right distal stone with right hydroureteronephrosis.  She underwent Feb 2025 rt URS/HLL/stent. Post-op fever. She pulled stent/tether.   Today, seen for the above.  She underwent an ultrasound 09/12/2023.  This was read as moderate right hydronephrosis.  I reviewed the images and it looks more to me like an extrarenal pelvis with much improved hydronephrosis compared to her prior CT scans.  Her recent creatinine was Cr 1.02. She's also dealing stage IV endometriosis and had ex lap in Mar 2025. May 2025 pelvic MRI with 5 cm righr adnexal mass. Also, might have ex lap at [facility].   She's had no stone passage. No flank pain but gets uncomfortable in the left and right side. No gross hematuria.     PMH: Past Medical History:  Diagnosis Date   GERD (gastroesophageal reflux disease)    History of kidney stones    History of recurrent UTIs    Hypertension    Kidney stones     Surgical History: Past Surgical History:  Procedure Laterality Date   CYSTOSCOPY WITH STENT PLACEMENT Right 06/14/2019   Procedure: Cystoscopy, Right Retrograde Pyelogram, Right Stent Placement;  Surgeon: Sherrilee Belvie CROME, MD;  Location: [facility];  Service: Urology;  Laterality: Right;   CYSTOSCOPY/RETROGRADE/URETEROSCOPY/STONE EXTRACTION WITH BASKET Right 06/12/2023   Procedure:  CYSTOSCOPY/RETROGRADE/URETEROSCOPY/STONE EXTRACTION WITH BASKET AND STENT PLACEMENT;  Surgeon: Nieves Cough, MD;  Location: [facility];  Service: Urology;  Laterality: Right;   CYSTOSCOPY/URETEROSCOPY/HOLMIUM LASER/STENT PLACEMENT Right 07/08/2019   Procedure: CYSTOSCOPY/RIGHT URETEROSCOPY/RIGHT RETROGRADE PYELOGRAM/STENT EXCHANGE;  Surgeon: Sherrilee Belvie CROME, MD;  Location: [facility];  Service: Urology;  Laterality: Right;   HOLMIUM LASER APPLICATION Right 07/08/2019   Procedure: HOLMIUM LASER LITHOTRIPSY RIGHT URETERAL CALCULUS;  Surgeon: Sherrilee Belvie CROME, MD;  Location: [facility];  Service: Urology;  Laterality: Right;   HOLMIUM LASER APPLICATION Right 06/12/2023   Procedure: HOLMIUM LASER APPLICATION;  Surgeon: Nieves Cough, MD;  Location: [facility];  Service: Urology;  Laterality: Right;   INTRAUTERINE DEVICE (IUD) INSERTION N/A 07/03/2023   Procedure: MIRENA  INTRAUTERINE DEVICE (IUD) INSERTION;  Surgeon: Dannielle Bouchard, DO;  [facility];  Service: Gynecology;  Laterality: N/A;   LAPAROSCOPIC OVARIAN CYSTECTOMY Right 07/03/2023   Procedure: LAPAROSCOPIC OVARIAN CYST BIOPSY;  Surgeon: Dannielle Bouchard, DO;  [facility]: [facility];  Service: Gynecology;  Laterality: Right;  [facility]   LAPAROSCOPY  07/03/2023   Procedure: DIAGNOSTIC LAPAROSCOPY;  Surgeon: Dannielle Bouchard, DO;  [facility];  Service: Gynecology;;   NO PAST SURGERIES      Home Medications:  Allergies as of 10/23/2023       Reactions   Codeine Hives, Shortness Of Breath   Tolerates hydromorphone    Hydrocodone Hives        Medication List        Accurate as of October 23, 2023 11:06 AM. If you have any questions, ask your nurse or doctor.  amLODipine  10 MG tablet Commonly known as: NORVASC  Take 10 mg by mouth daily.   ibuprofen  800 MG tablet Commonly known as: ADVIL  Take 800 mg by mouth as needed.   Lexapro 20 MG tablet Generic drug: escitalopram Take 20 mg by mouth daily.   lisinopril 20 MG tablet Commonly  known as: ZESTRIL Take 20 mg by mouth daily.   polyethylene glycol 17 g packet Commonly known as: MIRALAX  / GLYCOLAX  Take 17 g by mouth daily.   senna-docusate 8.6-50 MG tablet Commonly known as: Senokot-S Take 2 tablets by mouth at bedtime. What changed:  when to take this reasons to take this   spironolactone 25 MG tablet Commonly known as: ALDACTONE Take 25 mg by mouth daily.        Allergies:  Allergies  Allergen Reactions   Codeine Hives and Shortness Of Breath    Tolerates hydromorphone    Hydrocodone Hives    Family History: Family History  Problem Relation Age of Onset   Heart disease Paternal Grandmother    Cancer Paternal Grandmother        breast   Thyroid  disease Father    Cancer Father        lung   Hodgkin's lymphoma Father    Sudden Cardiac Death Father        Was not sure if was an MI or not.  Was during cancer treatment.  No autopsy done   Kidney disease Mother        GN3N   Other Maternal Grandmother        Guillian Barre syndrome    Social History:  reports that she quit smoking about 9 years ago. Her smoking use included cigarettes. She started smoking about 19 years ago. She has a 5 pack-year smoking history. She has never used smokeless tobacco. She reports that she does not drink alcohol and does not use drugs.   Physical Exam: BP (!) 141/98   Pulse 75   Constitutional:  Alert and oriented, No acute distress. HEENT: [place] AT, moist mucus membranes.  Trachea midline, no masses. Cardiovascular: No clubbing, cyanosis, or edema. Respiratory: Normal respiratory effort, no increased work of breathing. GI: Abdomen is soft, nontender, nondistended, no abdominal masses GU: No CVA tenderness Lymph: No cervical or inguinal lymphadenopathy. Skin: No rashes, bruises or suspicious lesions. Neurologic: Grossly intact, no focal deficits, moving all 4 extremities. Psychiatric: Normal mood and affect.  Laboratory Data: Lab Results  Component Value  Date   WBC 9.1 07/03/2023   HGB 13.2 07/03/2023   HCT 40.1 07/03/2023   MCV 84.2 07/03/2023   PLT 375 07/03/2023    Lab Results  Component Value Date   CREATININE 0.83 07/03/2023    No results found for: PSA  No results found for: TESTOSTERONE  No results found for: HGBA1C  Urinalysis    Component Value Date/Time   COLORURINE YELLOW 06/14/2023 1230   APPEARANCEUR HAZY (A) 06/14/2023 1230   LABSPEC 1.015 06/14/2023 1230   PHURINE 6.0 06/14/2023 1230   GLUCOSEU NEGATIVE 06/14/2023 1230   HGBUR LARGE (A) 06/14/2023 1230   BILIRUBINUR NEGATIVE 06/14/2023 1230   BILIRUBINUR neg 07/24/2019 0930   KETONESUR NEGATIVE 06/14/2023 1230   PROTEINUR 100 (A) 06/14/2023 1230   UROBILINOGEN 0.2 07/24/2019 0930   UROBILINOGEN 4.0 (H) 05/26/2014 1803   NITRITE NEGATIVE 06/14/2023 1230   LEUKOCYTESUR SMALL (A) 06/14/2023 1230    Lab Results  Component Value Date   BACTERIA RARE (A) 06/14/2023    Pertinent Imaging:  Cts 2021, 2025  Results for orders placed during the hospital encounter of 09/12/23  US  RENAL  Narrative CLINICAL DATA:  History of pyelonephritis.  EXAM: RENAL / URINARY TRACT ULTRASOUND COMPLETE  COMPARISON:  Renal ultrasound 06/21/2023  FINDINGS: Right Kidney:  Renal measurements: 11.3 x 4.5 x 4.3 cm = volume: 115.7 mL. Normal renal cortical thickness and echogenicity. Persistent moderate right hydronephrosis.  Left Kidney:  Renal measurements: 9.6 x 4.7 x 4.6 cm = volume: 111.9 mL. Echogenicity within normal limits. No mass or hydronephrosis visualized.  Bladder:  Appears normal for degree of bladder distention.  Other:  Incidentally identified complex cystic structures within the adnexa bilaterally.  IMPRESSION: 1. Persistent moderate right hydronephrosis. 2. Incidentally identified complex cystic structures within the adnexa bilaterally. Recommend dedicated pelvic ultrasound for further evaluation.   Electronically  Signed By: Bard Moats M.D. On: 09/14/2023 20:59  No results found for this or any previous visit.  No results found for this or any previous visit.  Results for orders placed during the hospital encounter of 06/11/23  CT Renal Stone Study  Narrative CLINICAL DATA:  Right flank pain, left lower quadrant pain. Nausea, vomiting.  EXAM: CT ABDOMEN AND PELVIS WITHOUT CONTRAST  TECHNIQUE: Multidetector CT imaging of the abdomen and pelvis was performed following the standard protocol without IV contrast.  RADIATION DOSE REDUCTION: This exam was performed according to the departmental dose-optimization program which includes automated exposure control, adjustment of the mA and/or kV according to patient size and/or use of iterative reconstruction technique.  COMPARISON:  07/03/2019  FINDINGS: Lower chest: No acute abnormality  Hepatobiliary: No focal hepatic abnormality. Gallbladder unremarkable.  Pancreas: No focal abnormality or ductal dilatation.  Spleen: No focal abnormality.  Normal size.  Adrenals/Urinary Tract: Adrenal glands normal. Moderate right hydronephrosis and hydroureter due to 4 mm distal right ureteral stone. Left lower pole nonobstructing nephrolithiasis. No hydronephrosis on the left. Urinary bladder normal.  Stomach/Bowel: Normal appendix. Stomach, large and small bowel grossly unremarkable.  Vascular/Lymphatic: No evidence of aneurysm or adenopathy.  Reproductive: 7 cm mass in the right adnexa. It is difficult to determine if this is an exophytic/pedunculated fibroid or a right ovarian hemorrhagic cyst. 3.2 cm cyst in the left ovary.  Other: No free fluid or free air.  Musculoskeletal: No acute bony abnormality.  IMPRESSION: 4 mm distal right ureteral stone with moderate right hydronephrosis and hydroureter.  Left nephrolithiasis.  7 cm mass in the right adnexal region. This could reflect a pedunculated fibroid or right ovarian lesion,  possibly hemorrhagic cyst. This could be further evaluated with pelvic ultrasound.   Electronically Signed By: Franky Crease M.D. On: 06/11/2023 20:33   Assessment & Plan:    1. Ureteral, renal stones - resolved.  Discussed dietary changes to prevent stones. - Urinalysis, Routine w reflex microscopic  2.  Hydronephrosis-she had 2 prior episodes with pretty severe right hydronephrosis and this may just be some residual extrarenal pelvis appearance.  I would recommend we keep ultrasound surveillance. Could be related to the right adnexal mass but again will continue surveillance.   3. MH -no worrisome symptoms.  Her prior cystoscopy and imaging benign.  Will continue to monitor as above.  No follow-ups on file.  Donnice Brooks, MD  [facility]  [address] [place], [place] [address] [phone]

## 2023-11-21 ENCOUNTER — Ambulatory Visit (INDEPENDENT_AMBULATORY_CARE_PROVIDER_SITE_OTHER): Admitting: Neurology

## 2023-11-21 ENCOUNTER — Encounter: Payer: Self-pay | Admitting: Neurology

## 2023-11-21 VITALS — BP 131/90 | HR 87 | Resp 15 | Ht 62.0 in | Wt 171.5 lb

## 2023-11-21 DIAGNOSIS — R202 Paresthesia of skin: Secondary | ICD-10-CM | POA: Insufficient documentation

## 2023-11-21 DIAGNOSIS — R4189 Other symptoms and signs involving cognitive functions and awareness: Secondary | ICD-10-CM | POA: Insufficient documentation

## 2023-11-21 DIAGNOSIS — G43709 Chronic migraine without aura, not intractable, without status migrainosus: Secondary | ICD-10-CM | POA: Insufficient documentation

## 2023-11-21 MED ORDER — SUMATRIPTAN SUCCINATE 50 MG PO TABS: 50.0000 mg | ORAL_TABLET | ORAL | 6 refills | Status: AC | PRN

## 2023-11-21 MED ORDER — NORTRIPTYLINE HCL 10 MG PO CAPS: 20.0000 mg | ORAL_CAPSULE | Freq: Every day | ORAL | 11 refills | Status: AC

## 2023-11-21 NOTE — Progress Notes (Signed)
 Chief Complaint  Patient presents with   New Patient (Initial Visit)    Rm14, alone, New patient referral from [facility]/Paul Ziomek MD [phone] leg numbness: ongoing for 4 months ago , loss of grip strength: ongoing 6 months , headaches, eval for MS. Pt stated ha's in a month number varies. Pt stated that she has left foot dragging or drop and multiple falls (falls screening completed)      ASSESSMENT AND PLAN  Bianca [name] is a 38 y.o. female   Constellation of complaints, including cognitive impairment Paresthesia, intermittent weakness, gait abnormality  MoCA examination 27/30  Most likely related to her anxiety,  Complete evaluation with MRI of the brain,  Laboratory evaluations Chronic migraine  Nortriptyline  10 titrating to 20 mg every night as preventive medication  Imitrex  as needed   DIAGNOSTIC DATA (LABS, IMAGING, TESTING) - I reviewed patient records, labs, notes, testing and imaging myself where available.   MEDICAL HISTORY:  Bianca [name] is a 38 year old female, seen in request by her primary care from [facility] Dr. Ziomek, Paul, for evaluation of constellation of complaints, initial evaluation November 21, 2023  History is obtained from the patient and review of electronic medical records. I personally reviewed pertinent available imaging films in PACS.   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, since February 2025, after her recent episode of kidney stone and the procedure, she began to notice difficulty keeping up with her job, word finding difficulty, could not compose a email, difficulty getting words out, could not remember conversation, is tearful at today's visit  She also reported episode of sudden onset right eye pain, blurry, but improved after overnight sleep  She also reports a few episode of sudden onset loss of balance, left leg numbness, weakness, fell  few times  Long history of headache, getting worse over the past few months, occipital region, sometimes retro-orbital area, pressure, pounding, light noise sensitivity, at least once a week, ibuprofen  provide limited help,   Lab in May 2025 at [facility] elevation of WBC 11.1, hemoglobin of 14.1, elevated alkaline phosphate 155,   PHYSICAL EXAM:   Vitals:   11/21/23 1456 11/21/23 1504  BP: (!) 145/87 (!) 131/90  Pulse: 87   Resp: 15   SpO2: 97%   Weight: 171 lb 8 oz (77.8 kg)   Height: 5' 2 (1.575 m)    Body mass index is 31.37 kg/m.  PHYSICAL EXAMNIATION:  Gen: NAD, conversant, well nourised, well groomed                     Cardiovascular: Regular rate rhythm, no peripheral edema, warm, nontender. Eyes: Conjunctivae clear without exudates or hemorrhage Neck: Supple, no carotid bruits. Pulmonary: Clear to auscultation bilaterally   NEUROLOGICAL EXAM:  MENTAL STATUS: Speech/cognition: Tearful, awake, alert, oriented to history taking and casual conversation    11/21/2023    3:58 PM  Montreal Cognitive Assessment   Visuospatial/ Executive (0/5) 4  Naming (0/3) 3  Attention: Read list of digits (0/2) 2  Attention: Read list of letters (0/1) 1  Attention: Serial 7 subtraction starting at 100 (0/3) 3  Language: Repeat phrase (0/2) 2  Language : Fluency (0/1) 0  Abstraction (0/2) 2  Delayed Recall (0/5) 4  Orientation (0/6) 6  Total 27    CRANIAL NERVES: CN II: Visual fields are full to confrontation. Pupils are  round equal and briskly reactive to light. CN III, IV, VI: extraocular movement are normal. No ptosis. CN V: Facial sensation is intact to light touch CN VII: Face is symmetric with normal eye closure  CN VIII: Hearing is normal to causal conversation. CN IX, X: Phonation is normal. CN XI: Head turning and shoulder shrug are intact  MOTOR: There is no pronator drift of out-stretched arms. Muscle bulk and tone are normal. Muscle strength is  normal.  REFLEXES: Reflexes are 2+ and symmetric at the biceps, triceps, knees, and ankles. Plantar responses are flexor.  SENSORY: Intact to light touch, pinprick and vibratory sensation are intact in fingers and toes.  COORDINATION: There is no trunk or limb dysmetria noted.  GAIT/STANCE: Posture is normal. Gait is steady    REVIEW OF SYSTEMS:  Full 14 system review of systems performed and notable only for as above All other review of systems were negative.   ALLERGIES: Allergies  Allergen Reactions   Codeine Hives and Shortness Of Breath    Tolerates hydromorphone    Hydrocodone Hives and Rash    HOME MEDICATIONS: Current Outpatient Medications  Medication Sig Dispense Refill   amLODipine  (NORVASC ) 10 MG tablet Take 10 mg by mouth daily.     ibuprofen  (ADVIL ) 800 MG tablet Take 800 mg by mouth as needed.     LEXAPRO 20 MG tablet Take 20 mg by mouth daily.     lisinopril (ZESTRIL) 20 MG tablet Take 20 mg by mouth daily.     polyethylene glycol (MIRALAX  / GLYCOLAX ) 17 g packet Take 17 g by mouth daily. 14 each 0   spironolactone (ALDACTONE) 25 MG tablet Take 25 mg by mouth daily.     No current facility-administered medications for this visit.    PAST MEDICAL HISTORY: Past Medical History:  Diagnosis Date   GERD (gastroesophageal reflux disease)    History of kidney stones    History of recurrent UTIs    Hypertension    Kidney stones     PAST SURGICAL HISTORY: Past Surgical History:  Procedure Laterality Date   CYSTOSCOPY WITH STENT PLACEMENT Right 06/14/2019   Procedure: Cystoscopy, Right Retrograde Pyelogram, Right Stent Placement;  Surgeon: Sherrilee Belvie CROME, MD;  [facility];  Service: Urology;  Laterality: Right;   CYSTOSCOPY/RETROGRADE/URETEROSCOPY/STONE EXTRACTION WITH BASKET Right 06/12/2023   Procedure: CYSTOSCOPY/RETROGRADE/URETEROSCOPY/STONE EXTRACTION WITH BASKET AND STENT PLACEMENT;  Surgeon: Nieves Cough, MD;  Location: [facility];   Service: Urology;  Laterality: Right;   CYSTOSCOPY/URETEROSCOPY/HOLMIUM LASER/STENT PLACEMENT Right 07/08/2019   Procedure: CYSTOSCOPY/RIGHT URETEROSCOPY/RIGHT RETROGRADE PYELOGRAM/STENT EXCHANGE;  Surgeon: Sherrilee Belvie CROME, MD;  Location: [facility];  Service: Urology;  Laterality: Right;   HOLMIUM LASER APPLICATION Right 07/08/2019   Procedure: HOLMIUM LASER LITHOTRIPSY RIGHT URETERAL CALCULUS;  Surgeon: Sherrilee Belvie CROME, MD;  Location: [facility];  Service: Urology;  Laterality: Right;   HOLMIUM LASER APPLICATION Right 06/12/2023   Procedure: HOLMIUM LASER APPLICATION;  Surgeon: Nieves Cough, MD;  Location: [facility];  Service: Urology;  Laterality: Right;   INTRAUTERINE DEVICE (IUD) INSERTION N/A 07/03/2023   Procedure: MIRENA  INTRAUTERINE DEVICE (IUD) INSERTION;  Surgeon: Dannielle Bouchard, DO;  [facility];  Service: Gynecology;  Laterality: N/A;   LAPAROSCOPIC OVARIAN CYSTECTOMY Right 07/03/2023   Procedure: LAPAROSCOPIC OVARIAN CYST BIOPSY;  Surgeon: Dannielle Bouchard, DO;  [facility]: [facility];  Service: Gynecology;  Laterality: Right;  [facility]   LAPAROSCOPY  07/03/2023   Procedure: DIAGNOSTIC LAPAROSCOPY;  Surgeon: Dannielle Bouchard, DO;  [facility]: [facility];  Service: Gynecology;;  NO PAST SURGERIES      FAMILY HISTORY: Family History  Problem Relation Age of Onset   Heart disease Paternal Grandmother    Cancer Paternal Grandmother        breast   Thyroid  disease Father    Cancer Father        lung   Hodgkin's lymphoma Father    Sudden Cardiac Death Father        Was not sure if was an MI or not.  Was during cancer treatment.  No autopsy done   Kidney disease Mother        GN3N   Other Maternal Grandmother        Guillian Barre syndrome    SOCIAL HISTORY: Social History   Socioeconomic History   Marital status: Married    Spouse name: Not on file   Number of children: Not on file   Years of education: Not on file   Highest education level: Not on file  Occupational History   Not on file   Tobacco Use   Smoking status: Former    Current packs/day: 0.00    Average packs/day: 0.5 packs/day for 10.0 years (5.0 ttl pk-yrs)    Types: Cigarettes    Start date: 03/25/2004    Quit date: 03/25/2014    Years since quitting: 9.6   Smokeless tobacco: Never  Vaping Use   Vaping status: Never Used  Substance and Sexual Activity   Alcohol use: No   Drug use: No   Sexual activity: Yes    Birth control/protection: None  Other Topics Concern   Not on file  Social History Narrative   Not on file   Social Drivers of Health   Financial Resource Strain: Not on file  Food Insecurity: No Food Insecurity (06/14/2023)   Hunger Vital Sign    Worried About Running Out of Food in the Last Year: Never true    Ran Out of Food in the Last Year: Never true  Transportation Needs: No Transportation Needs (06/14/2023)   PRAPARE - Administrator, Civil Service (Medical): No    Lack of Transportation (Non-Medical): No  Physical Activity: Not on file  Stress: Not on file  Social Connections: Not on file  Intimate Partner Violence: Not At Risk (06/14/2023)   Humiliation, Afraid, Rape, and Kick questionnaire    Fear of Current or Ex-Partner: No    Emotionally Abused: No    Physically Abused: No    Sexually Abused: No      Modena Callander, M.D. Ph.D.  [facility] [address], [address] [place], [place] [address] Ph: [phone] Fax: [phone]  CC:  Cristine Mt, MD [address] [place],  [place] [address]  Cristine Mt, MD

## 2023-12-06 ENCOUNTER — Ambulatory Visit (INDEPENDENT_AMBULATORY_CARE_PROVIDER_SITE_OTHER)

## 2023-12-06 DIAGNOSIS — R4189 Other symptoms and signs involving cognitive functions and awareness: Secondary | ICD-10-CM

## 2023-12-12 ENCOUNTER — Ambulatory Visit: Payer: Self-pay | Admitting: Neurology

## 2024-01-22 ENCOUNTER — Ambulatory Visit: Admitting: Neurology

## 2024-02-13 ENCOUNTER — Ambulatory Visit (HOSPITAL_COMMUNITY)
Admission: RE | Admit: 2024-02-13 | Discharge: 2024-02-13 | Disposition: A | Discharge status: Home / Self Care | Source: Ambulatory Visit | Attending: Urology | Admitting: Urology

## 2024-02-13 DIAGNOSIS — N202 Calculus of kidney with calculus of ureter: Secondary | ICD-10-CM | POA: Insufficient documentation

## 2024-02-13 DIAGNOSIS — N1339 Other hydronephrosis: Secondary | ICD-10-CM | POA: Insufficient documentation

## 2024-02-14 ENCOUNTER — Ambulatory Visit: Payer: Self-pay

## 2024-02-26 ENCOUNTER — Ambulatory Visit: Admitting: Urology

## 2024-02-26 ENCOUNTER — Encounter: Payer: Self-pay | Admitting: Urology

## 2024-02-26 VITALS — BP 169/94 | HR 82

## 2024-02-26 DIAGNOSIS — N200 Calculus of kidney: Secondary | ICD-10-CM | POA: Diagnosis not present

## 2024-02-26 DIAGNOSIS — N1330 Unspecified hydronephrosis: Secondary | ICD-10-CM

## 2024-02-26 LAB — MICROSCOPIC EXAMINATION: Bacteria, UA: NONE SEEN

## 2024-02-26 LAB — URINALYSIS, ROUTINE W REFLEX MICROSCOPIC
Bilirubin, UA: NEGATIVE
Glucose, UA: NEGATIVE
Ketones, UA: NEGATIVE
Nitrite, UA: NEGATIVE
Protein,UA: NEGATIVE
Specific Gravity, UA: 1.01 (ref 1.005–1.030)
Urobilinogen, Ur: 0.2 mg/dL (ref 0.2–1.0)
pH, UA: 8 — ABNORMAL HIGH (ref 5.0–7.5)

## 2024-02-26 NOTE — Progress Notes (Unsigned)
 02/26/2024 11:36 AM   Bianca [name] 12/31/1985 [id number]  Referring provider: Ziomek, Paul, MD [address] [place],  [place] [address]  No chief complaint on file.   HPI:  1) urolithiasis -patient required right ureteroscopy with laser lithotripsy and stent placement with Dr. Sherrilee March 2021 for a 7 mm right distal ureteral stone with severe proximal right hydroureteronephrosis.  Postop CT in 2021 with the stent showed continued right hydronephrosis.  Stent was removed in the office.  No follow-up imaging.   She had recurrent right flank pain February 2025 and CT revealed a 4 mm right distal stone with right hydroureteronephrosis.  She underwent Feb 2025 rt URS/HLL/stent. Post-op fever. She pulled stent/tether.    She underwent an ultrasound 09/12/2023.  This was read as moderate right hydronephrosis.  I reviewed the images and it looks more to me like an extrarenal pelvis with much improved hydronephrosis compared to her prior CT scans.  Her recent creatinine was Cr 1.02. She's also dealing stage IV endometriosis and had ex lap in Mar 2025. May 2025 pelvic MRI with 5 cm right adnexal mass. Also, might have ex lap at [facility].    Today, seen for the above.  She underwent October 2025 renal ultrasound with LLP stone 6 mm, Mild fullness of the right renal pelvis and proximal ureter on prevoid exam without definite nephrolithiasis, improved to prior. She did have surgery at [facility] - OCT 2025 - ex lap, LOA. No Hx or BSO.   PMH: Past Medical History:  Diagnosis Date   GERD (gastroesophageal reflux disease)    History of kidney stones    History of recurrent UTIs    Hypertension    Kidney stones     Surgical History: Past Surgical History:  Procedure Laterality Date   CYSTOSCOPY WITH STENT PLACEMENT Right 06/14/2019   Procedure: Cystoscopy, Right Retrograde Pyelogram, Right Stent Placement;  Surgeon: Sherrilee Belvie CROME, MD;  Location: [facility];  Service: Urology;  Laterality: Right;    CYSTOSCOPY/RETROGRADE/URETEROSCOPY/STONE EXTRACTION WITH BASKET Right 06/12/2023   Procedure: CYSTOSCOPY/RETROGRADE/URETEROSCOPY/STONE EXTRACTION WITH BASKET AND STENT PLACEMENT;  Surgeon: Nieves Cough, MD;  Location: [facility];  Service: Urology;  Laterality: Right;   CYSTOSCOPY/URETEROSCOPY/HOLMIUM LASER/STENT PLACEMENT Right 07/08/2019   Procedure: CYSTOSCOPY/RIGHT URETEROSCOPY/RIGHT RETROGRADE PYELOGRAM/STENT EXCHANGE;  Surgeon: Sherrilee Belvie CROME, MD;  Location: [facility];  Service: Urology;  Laterality: Right;   HOLMIUM LASER APPLICATION Right 07/08/2019   Procedure: HOLMIUM LASER LITHOTRIPSY RIGHT URETERAL CALCULUS;  Surgeon: Sherrilee Belvie CROME, MD;  Location: [facility];  Service: Urology;  Laterality: Right;   HOLMIUM LASER APPLICATION Right 06/12/2023   Procedure: HOLMIUM LASER APPLICATION;  Surgeon: Nieves Cough, MD;  Location: [facility];  Service: Urology;  Laterality: Right;   INTRAUTERINE DEVICE (IUD) INSERTION N/A 07/03/2023   Procedure: MIRENA  INTRAUTERINE DEVICE (IUD) INSERTION;  Surgeon: Dannielle Bouchard, DO;  Location: [facility];  Service: Gynecology;  Laterality: N/A;   LAPAROSCOPIC OVARIAN CYSTECTOMY Right 07/03/2023   Procedure: LAPAROSCOPIC OVARIAN CYST BIOPSY;  Surgeon: Dannielle Bouchard, DO;  Location: [facility];  Service: Gynecology;  Laterality: Right;  [facility]   LAPAROSCOPY  07/03/2023   Procedure: DIAGNOSTIC LAPAROSCOPY;  Surgeon: Dannielle Bouchard, DO;  Location: [facility];  Service: Gynecology;;   NO PAST SURGERIES      Home Medications:  Allergies as of 02/26/2024       Reactions   Codeine Hives, Shortness Of Breath   Tolerates hydromorphone    Hydrocodone Hives, Rash        Medication List  Accurate as of February 26, 2024 11:36 AM. If you have any questions, ask your nurse or doctor.          amLODipine  10 MG tablet Commonly known as: NORVASC  Take 10 mg by mouth daily.   ibuprofen  800 MG tablet Commonly known as: ADVIL  Take 800 mg by mouth as needed.   Lexapro 20 MG  tablet Generic drug: escitalopram Take 20 mg by mouth daily.   lisinopril 20 MG tablet Commonly known as: ZESTRIL Take 20 mg by mouth daily.   nortriptyline  10 MG capsule Commonly known as: PAMELOR  Take 2 capsules (20 mg total) by mouth at bedtime.   polyethylene glycol 17 g packet Commonly known as: MIRALAX  / GLYCOLAX  Take 17 g by mouth daily.   spironolactone 25 MG tablet Commonly known as: ALDACTONE Take 25 mg by mouth daily.   SUMAtriptan  50 MG tablet Commonly known as: Imitrex  Take 1 tablet (50 mg total) by mouth every 2 (two) hours as needed. May repeat in 2 hours if headache persists or recurs.        Allergies:  Allergies  Allergen Reactions   Codeine Hives and Shortness Of Breath    Tolerates hydromorphone    Hydrocodone Hives and Rash    Family History: Family History  Problem Relation Age of Onset   Heart disease Paternal Grandmother    Cancer Paternal Grandmother        breast   Thyroid  disease Father    Cancer Father        lung   Hodgkin's lymphoma Father    Sudden Cardiac Death Father        Was not sure if was an MI or not.  Was during cancer treatment.  No autopsy done   Kidney disease Mother        GN3N   Other Maternal Grandmother        Guillian Barre syndrome    Social History:  reports that she quit smoking about 9 years ago. Her smoking use included cigarettes. She started smoking about 19 years ago. She has a 5 pack-year smoking history. She has never used smokeless tobacco. She reports that she does not drink alcohol and does not use drugs.   Physical Exam: There were no vitals taken for this visit.  Constitutional:  Alert and oriented, No acute distress. HEENT: [place] AT, moist mucus membranes.  Trachea midline, no masses. Cardiovascular: No clubbing, cyanosis, or edema. Respiratory: Normal respiratory effort, no increased work of breathing. GI: Abdomen is soft, nontender, nondistended, no abdominal masses GU: No CVA  tenderness Lymph: No cervical or inguinal lymphadenopathy. Skin: No rashes, bruises or suspicious lesions. Neurologic: Grossly intact, no focal deficits, moving all 4 extremities. Psychiatric: Normal mood and affect.  Laboratory Data: Lab Results  Component Value Date   WBC 9.1 07/03/2023   HGB 13.2 07/03/2023   HCT 40.1 07/03/2023   MCV 84.2 07/03/2023   PLT 375 07/03/2023    Lab Results  Component Value Date   CREATININE 0.83 07/03/2023    No results found for: PSA  No results found for: TESTOSTERONE  No results found for: HGBA1C  Urinalysis    Component Value Date/Time   COLORURINE YELLOW 06/14/2023 1230   APPEARANCEUR Clear 10/23/2023 1110   LABSPEC 1.015 06/14/2023 1230   PHURINE 6.0 06/14/2023 1230   GLUCOSEU Negative 10/23/2023 1110   HGBUR LARGE (A) 06/14/2023 1230   BILIRUBINUR Negative 10/23/2023 1110   KETONESUR NEGATIVE 06/14/2023 1230   PROTEINUR Trace 10/23/2023 1110  PROTEINUR 100 (A) 06/14/2023 1230   UROBILINOGEN 0.2 07/24/2019 0930   UROBILINOGEN 4.0 (H) 05/26/2014 1803   NITRITE Negative 10/23/2023 1110   NITRITE NEGATIVE 06/14/2023 1230   LEUKOCYTESUR 2+ (A) 10/23/2023 1110   LEUKOCYTESUR SMALL (A) 06/14/2023 1230    Lab Results  Component Value Date   LABMICR See below: 10/23/2023   WBCUA >30 (A) 10/23/2023   LABEPIT 0-10 10/23/2023   BACTERIA Moderate (A) 10/23/2023    Pertinent Imaging:   Results for orders placed during the hospital encounter of 02/13/24  US  RENAL  Narrative CLINICAL DATA:  Right hydronephrosis  EXAM: RENAL / URINARY TRACT ULTRASOUND COMPLETE  COMPARISON:  Renal ultrasound Sep 12, 2023.  FINDINGS: Right Kidney:  Renal measurements: 11 x 4.7 x 6.6 cm = volume: 176 mL. Mild fullness of the renal pelvis and proximal ureter which was resolved on postvoid exam and relatively decreased to prior this. Echogenicity within normal limits. No mass or nephrolithiasis visualized.  Left Kidney:  Renal  measurements: 10.3 x 5.3 x 5.1 cm = volume: 145 mL. Lower pole nonobstructive nephrolithiasis measuring 6.1 mm. Echogenicity within normal limits. No mass or hydronephrosis visualized.  Bladder:  Appears normal for degree of bladder distention.  Other:  None.  IMPRESSION: Mild fullness of the right renal pelvis and proximal ureter on pre void exam without definite nephrolithiasis, improved to prior.  Left kidney nonobstructive nephrolithiasis without hydronephrosis.   Electronically Signed By: Megan  Zare M.D. On: 02/13/2024 18:17  No results found for this or any previous visit.  No results found for this or any previous visit.  Results for orders placed during the hospital encounter of 06/11/23  CT Renal Stone Study  Narrative CLINICAL DATA:  Right flank pain, left lower quadrant pain. Nausea, vomiting.  EXAM: CT ABDOMEN AND PELVIS WITHOUT CONTRAST  TECHNIQUE: Multidetector CT imaging of the abdomen and pelvis was performed following the standard protocol without IV contrast.  RADIATION DOSE REDUCTION: This exam was performed according to the departmental dose-optimization program which includes automated exposure control, adjustment of the mA and/or kV according to patient size and/or use of iterative reconstruction technique.  COMPARISON:  07/03/2019  FINDINGS: Lower chest: No acute abnormality  Hepatobiliary: No focal hepatic abnormality. Gallbladder unremarkable.  Pancreas: No focal abnormality or ductal dilatation.  Spleen: No focal abnormality.  Normal size.  Adrenals/Urinary Tract: Adrenal glands normal. Moderate right hydronephrosis and hydroureter due to 4 mm distal right ureteral stone. Left lower pole nonobstructing nephrolithiasis. No hydronephrosis on the left. Urinary bladder normal.  Stomach/Bowel: Normal appendix. Stomach, large and small bowel grossly unremarkable.  Vascular/Lymphatic: No evidence of aneurysm or  adenopathy.  Reproductive: 7 cm mass in the right adnexa. It is difficult to determine if this is an exophytic/pedunculated fibroid or a right ovarian hemorrhagic cyst. 3.2 cm cyst in the left ovary.  Other: No free fluid or free air.  Musculoskeletal: No acute bony abnormality.  IMPRESSION: 4 mm distal right ureteral stone with moderate right hydronephrosis and hydroureter.  Left nephrolithiasis.  7 cm mass in the right adnexal region. This could reflect a pedunculated fibroid or right ovarian lesion, possibly hemorrhagic cyst. This could be further evaluated with pelvic ultrasound.   Electronically Signed By: Franky Crease M.D. On: 06/11/2023 20:33   Assessment & Plan:    Kidney stones-stable.  She can follow-up with Dr. Sherrilee now with a KUB in 6 months.  Right hydronephrosis-resolved.  No follow-ups on file.  Donnice Brooks, MD  [facility] [place]  [address] [place], [place] [address] [phone]

## 2024-05-20 ENCOUNTER — Other Ambulatory Visit (HOSPITAL_COMMUNITY): Payer: Self-pay | Admitting: Obstetrics and Gynecology

## 2024-05-20 DIAGNOSIS — N809 Endometriosis, unspecified: Secondary | ICD-10-CM

## 2024-07-08 ENCOUNTER — Ambulatory Visit (HOSPITAL_COMMUNITY)

## 2024-08-28 ENCOUNTER — Ambulatory Visit: Admitting: Urology
